# Patient Record
Sex: Female | Born: 1990 | Race: White | Hispanic: No | Marital: Married | Smoking: Never smoker
Health system: Southern US, Community
[De-identification: ages and names within clinical notes are randomized; demographics above are authoritative.]

## PROBLEM LIST (undated history)

## (undated) DIAGNOSIS — K589 Irritable bowel syndrome without diarrhea: Secondary | ICD-10-CM

## (undated) DIAGNOSIS — R0600 Dyspnea, unspecified: Secondary | ICD-10-CM

## (undated) DIAGNOSIS — J309 Allergic rhinitis, unspecified: Secondary | ICD-10-CM

## (undated) DIAGNOSIS — F419 Anxiety disorder, unspecified: Secondary | ICD-10-CM

## (undated) DIAGNOSIS — Z8619 Personal history of other infectious and parasitic diseases: Secondary | ICD-10-CM

## (undated) DIAGNOSIS — J189 Pneumonia, unspecified organism: Secondary | ICD-10-CM

## (undated) DIAGNOSIS — R519 Headache, unspecified: Secondary | ICD-10-CM

## (undated) DIAGNOSIS — Z87442 Personal history of urinary calculi: Secondary | ICD-10-CM

## (undated) DIAGNOSIS — N200 Calculus of kidney: Secondary | ICD-10-CM

## (undated) DIAGNOSIS — T7840XA Allergy, unspecified, initial encounter: Secondary | ICD-10-CM

## (undated) DIAGNOSIS — M5126 Other intervertebral disc displacement, lumbar region: Secondary | ICD-10-CM

## (undated) DIAGNOSIS — G47419 Narcolepsy without cataplexy: Secondary | ICD-10-CM

## (undated) DIAGNOSIS — J45909 Unspecified asthma, uncomplicated: Secondary | ICD-10-CM

## (undated) HISTORY — DX: Other intervertebral disc displacement, lumbar region: M51.26

## (undated) HISTORY — DX: Personal history of other infectious and parasitic diseases: Z86.19

## (undated) HISTORY — PX: NASAL SEPTUM SURGERY: SHX37

## (undated) HISTORY — PX: TONSILLECTOMY: SUR1361

## (undated) HISTORY — DX: Allergic rhinitis, unspecified: J30.9

## (undated) HISTORY — DX: Irritable bowel syndrome, unspecified: K58.9

## (undated) HISTORY — DX: Allergy, unspecified, initial encounter: T78.40XA

## (undated) HISTORY — DX: Anxiety disorder, unspecified: F41.9

## (undated) HISTORY — DX: Narcolepsy without cataplexy: G47.419

## (undated) HISTORY — DX: Calculus of kidney: N20.0

## (undated) HISTORY — PX: LUMBAR FUSION: SHX111

## (undated) HISTORY — PX: EYE SURGERY: SHX253

## (undated) HISTORY — DX: Unspecified asthma, uncomplicated: J45.909

---
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
## 2009-03-22 ENCOUNTER — Encounter: Payer: Self-pay | Admitting: Internal Medicine

## 2009-04-26 ENCOUNTER — Ambulatory Visit: Payer: Self-pay | Admitting: Internal Medicine

## 2009-04-26 LAB — CONVERTED CEMR LAB
BUN: 17 mg/dL (ref 6–23)
Bilirubin Urine: NEGATIVE
Bilirubin, Direct: 0.1 mg/dL (ref 0.0–0.3)
CO2: 28 meq/L (ref 19–32)
Chloride: 108 meq/L (ref 96–112)
Cholesterol: 158 mg/dL (ref 0–200)
Creatinine, Ser: 0.8 mg/dL (ref 0.4–1.2)
Eosinophils Absolute: 0.1 10*3/uL (ref 0.0–0.7)
Glucose, Bld: 89 mg/dL (ref 70–99)
Ketones, urine, test strip: NEGATIVE
LDL Cholesterol: 93 mg/dL (ref 0–99)
Lymphs Abs: 2.9 10*3/uL (ref 0.7–4.0)
MCHC: 34.9 g/dL (ref 30.0–36.0)
MCV: 92 fL (ref 78.0–100.0)
Monocytes Absolute: 0.4 10*3/uL (ref 0.1–1.0)
Neutrophils Relative %: 43.1 % (ref 43.0–77.0)
Platelets: 221 10*3/uL (ref 150.0–400.0)
RDW: 11.3 % — ABNORMAL LOW (ref 11.5–14.6)
TSH: 3 microintl units/mL (ref 0.35–5.50)
Total Bilirubin: 0.5 mg/dL (ref 0.3–1.2)
Triglycerides: 75 mg/dL (ref 0.0–149.0)
Urobilinogen, UA: 0.2
WBC: 5.9 10*3/uL (ref 4.5–10.5)
pH: 6

## 2009-05-03 ENCOUNTER — Ambulatory Visit: Payer: Self-pay | Admitting: Internal Medicine

## 2011-06-24 ENCOUNTER — Emergency Department (HOSPITAL_COMMUNITY)
Admission: EM | Admit: 2011-06-24 | Discharge: 2011-06-24 | Disposition: A | Discharge status: Home / Self Care | Payer: BC Managed Care – PPO | Attending: Emergency Medicine | Admitting: Emergency Medicine

## 2011-06-24 ENCOUNTER — Emergency Department (HOSPITAL_COMMUNITY): Payer: BC Managed Care – PPO

## 2011-06-24 ENCOUNTER — Encounter (HOSPITAL_COMMUNITY): Payer: Self-pay

## 2011-06-24 DIAGNOSIS — H539 Unspecified visual disturbance: Secondary | ICD-10-CM | POA: Insufficient documentation

## 2011-06-24 DIAGNOSIS — R51 Headache: Secondary | ICD-10-CM | POA: Insufficient documentation

## 2011-11-26 ENCOUNTER — Ambulatory Visit (INDEPENDENT_AMBULATORY_CARE_PROVIDER_SITE_OTHER): Payer: BC Managed Care – PPO

## 2011-11-26 DIAGNOSIS — N760 Acute vaginitis: Secondary | ICD-10-CM

## 2012-03-22 LAB — HM PAP SMEAR: HM Pap smear: NORMAL

## 2012-04-27 ENCOUNTER — Other Ambulatory Visit (INDEPENDENT_AMBULATORY_CARE_PROVIDER_SITE_OTHER): Payer: Self-pay

## 2012-04-27 DIAGNOSIS — Z0000 Encounter for general adult medical examination without abnormal findings: Secondary | ICD-10-CM

## 2012-04-27 LAB — POCT URINALYSIS DIPSTICK
Bilirubin, UA: NEGATIVE
Glucose, UA: NEGATIVE
Ketones, UA: NEGATIVE
Nitrite, UA: NEGATIVE
pH, UA: 5.5

## 2012-04-27 LAB — CBC WITH DIFFERENTIAL/PLATELET
Basophils Absolute: 0 10*3/uL (ref 0.0–0.1)
Eosinophils Relative: 2.4 % (ref 0.0–5.0)
Lymphs Abs: 1.9 10*3/uL (ref 0.7–4.0)
MCV: 90.9 fl (ref 78.0–100.0)
Monocytes Absolute: 0.3 10*3/uL (ref 0.1–1.0)
Neutrophils Relative %: 58.6 % (ref 43.0–77.0)
Platelets: 284 10*3/uL (ref 150.0–400.0)
RDW: 12 % (ref 11.5–14.6)
WBC: 5.8 10*3/uL (ref 4.5–10.5)

## 2012-04-27 LAB — TSH: TSH: 2.77 u[IU]/mL (ref 0.35–5.50)

## 2012-04-27 LAB — HEPATIC FUNCTION PANEL
ALT: 21 U/L (ref 0–35)
Bilirubin, Direct: 0 mg/dL (ref 0.0–0.3)
Total Bilirubin: 0.3 mg/dL (ref 0.3–1.2)

## 2012-04-27 LAB — LIPID PANEL
HDL: 56.2 mg/dL (ref >39.00)
Total CHOL/HDL Ratio: 3
Triglycerides: 117 mg/dL (ref 0.0–149.0)
VLDL: 23.4 mg/dL (ref 0.0–40.0)

## 2012-04-27 LAB — BASIC METABOLIC PANEL
CO2: 26 mEq/L (ref 19–32)
GFR: 97.18 mL/min (ref >60.00)
Glucose, Bld: 94 mg/dL (ref 70–99)
Potassium: 4.3 mEq/L (ref 3.5–5.1)
Sodium: 138 mEq/L (ref 135–145)

## 2012-05-04 ENCOUNTER — Encounter: Payer: Self-pay | Admitting: Internal Medicine

## 2012-05-04 ENCOUNTER — Ambulatory Visit (INDEPENDENT_AMBULATORY_CARE_PROVIDER_SITE_OTHER): Payer: BC Managed Care – PPO | Admitting: Internal Medicine

## 2012-05-04 VITALS — BP 100/66 | HR 84 | Temp 98.1°F | Resp 20 | Ht 65.0 in | Wt 177.0 lb

## 2012-05-04 DIAGNOSIS — Z0000 Encounter for general adult medical examination without abnormal findings: Secondary | ICD-10-CM

## 2012-05-04 NOTE — Progress Notes (Signed)
  Subjective:    Patient ID: Lori Mann, female    DOB: 21-Mar-1991, 21 y.o.   MRN: [id number]  HPI  21 year old patient who is seen today for a health maintenance exam. She does quite well without concerns or complaints. She's had a recent gynecologic check and has had a laboratory panel performed as well. She is scheduled to see a nutritionist through her work. She has gained over 30 pounds over the past 3 years    Review of Systems  Constitutional: Negative for fever, appetite change, fatigue and unexpected weight change.  HENT: Negative for hearing loss, ear pain, nosebleeds, congestion, sore throat, mouth sores, trouble swallowing, neck stiffness, dental problem, voice change, sinus pressure and tinnitus.   Eyes: Negative for photophobia, pain, redness and visual disturbance.  Respiratory: Negative for cough, chest tightness and shortness of breath.   Cardiovascular: Negative for chest pain, palpitations and leg swelling.  Gastrointestinal: Negative for nausea, vomiting, abdominal pain, diarrhea, constipation, blood in stool, abdominal distention and rectal pain.  Genitourinary: Negative for dysuria, urgency, frequency, hematuria, flank pain, vaginal bleeding, vaginal discharge, difficulty urinating, genital sores, vaginal pain, menstrual problem and pelvic pain.  Musculoskeletal: Negative for back pain and arthralgias.  Skin: Negative for rash.  Neurological: Negative for dizziness, syncope, speech difficulty, weakness, light-headedness, numbness and headaches.  Hematological: Negative for adenopathy. Does not bruise/bleed easily.  Psychiatric/Behavioral: Negative for suicidal ideas, behavioral problems, self-injury, dysphoric mood and agitation. The patient is not nervous/anxious.        Objective:   Physical Exam  Constitutional: She is oriented to person, place, and time. She appears well-developed and well-nourished.       Weight 177 Blood pressure low normal  HENT:  Head:  Normocephalic and atraumatic.  Right Ear: External ear normal.  Left Ear: External ear normal.  Mouth/Throat: Oropharynx is clear and moist.  Eyes: Conjunctivae and EOM are normal.  Neck: Normal range of motion. Neck supple. No JVD present. No thyromegaly present.  Cardiovascular: Normal rate, regular rhythm, normal heart sounds and intact distal pulses.   No murmur heard. Pulmonary/Chest: Effort normal and breath sounds normal. She has no wheezes. She has no rales.  Abdominal: Soft. Bowel sounds are normal. She exhibits no distension and no mass. There is no tenderness. There is no rebound and no guarding.  Musculoskeletal: Normal range of motion. She exhibits no edema and no tenderness.  Neurological: She is alert and oriented to person, place, and time. She has normal reflexes. No cranial nerve deficit. She exhibits normal muscle tone. Coordination normal.  Skin: Skin is warm and dry. No rash noted.  Psychiatric: She has a normal mood and affect. Her behavior is normal.          Assessment & Plan:   Preventive health examination Exogenous obesity. Weight loss exercise encouraged Laboratory studies discussed and reviewed and copy dispensed

## 2012-05-04 NOTE — Patient Instructions (Signed)
It is important that you exercise regularly, at least 20 minutes 3 to 4 times per week.  If you develop chest pain or shortness of breath seek  medical attention.  You need to lose weight.  Consider a lower calorie diet and regular exercise.  Call or return to clinic prn if these symptoms worsen or fail to improve as anticipated.  

## 2013-01-18 ENCOUNTER — Ambulatory Visit (INDEPENDENT_AMBULATORY_CARE_PROVIDER_SITE_OTHER): Payer: BC Managed Care – PPO | Admitting: Family Medicine

## 2013-01-18 VITALS — BP 138/79 | HR 83 | Temp 98.0°F | Resp 18 | Ht 65.0 in | Wt 184.0 lb

## 2013-01-18 DIAGNOSIS — H5710 Ocular pain, unspecified eye: Secondary | ICD-10-CM

## 2013-01-18 DIAGNOSIS — R51 Headache: Secondary | ICD-10-CM

## 2013-01-18 DIAGNOSIS — J3489 Other specified disorders of nose and nasal sinuses: Secondary | ICD-10-CM

## 2013-01-18 DIAGNOSIS — H5712 Ocular pain, left eye: Secondary | ICD-10-CM

## 2013-01-18 NOTE — Patient Instructions (Signed)
Follow up with optho tomorrow.  No contact lenses until after that office visit. Ok to use artificial tears for now. Return to the clinic or go to the nearest emergency room if any of your symptoms worsen or new symptoms occur.

## 2013-01-18 NOTE — Progress Notes (Signed)
  Subjective:    Patient ID: Lori Mann, female    DOB: [birth year], 22 y.o.   MRN: [id number]  HPI Lori Mann is a 22 y.o. female  Started with sneezing, runny eyes and nose yesterday - L eye watering.  No fever. Prior to yesterday feeling. [facility] - told to be seen here for eye symptoms.  No hx of allergy symptoms.  No known sick contacts.  Wears contact lenses - same ones for past 2 weeks.  Did wear contacts in water. Eye is slightly sore today. Vision is ok.   Tx: allegra last week (for sneezing for past week), but runny nose and eye just since yesterday. netipot today.   L sided headache - mild   Review of Systems  Constitutional: Negative for fever and chills.  HENT: Positive for rhinorrhea.   Eyes: Positive for photophobia (slight headache with lights in office).  Respiratory: Negative for cough.   Neurological: Positive for headaches. Negative for dizziness, syncope, weakness and light-headedness.       Objective:   Physical Exam  Vitals reviewed. Constitutional: She is oriented to person, place, and time. She appears well-developed and well-nourished. No distress.  HENT:  Head: Normocephalic and atraumatic.  Right Ear: Hearing, tympanic membrane, external ear and ear canal normal.  Left Ear: Hearing, tympanic membrane, external ear and ear canal normal.  Nose: Nose normal.  Mouth/Throat: Oropharynx is clear and moist. No oropharyngeal exudate.  Eyes: EOM are normal. Pupils are equal, round, and reactive to light. Left eye exhibits discharge (clear. ). Left eye exhibits no exudate. No foreign body present in the left eye. Left conjunctiva is injected (minimal). Left eye exhibits normal extraocular motion.       Patient removed contact lenses. Anterior chamber clear,  Proparacaine 2 gtts applied.  Lids everted, no foreign body identified. Fluorescein applied, no uptake.  Flushed with sterile water, no complications. Closed eye palpation -  no apparent discrepancy in firmness, and no pain with palpation.    Cardiovascular: Normal rate, regular rhythm, normal heart sounds and intact distal pulses.   No murmur heard. Pulmonary/Chest: Effort normal and breath sounds normal. No respiratory distress. She has no wheezes. She has no rhonchi.  Neurological: She is alert and oriented to person, place, and time.  Skin: Skin is warm and dry. No rash noted.  Psychiatric: She has a normal mood and affect. Her behavior is normal.      Assessment & Plan:  Lori Mann is a 22 y.o. female 1. Pain, eye, left  Ambulatory referral to Ophthalmology  2. Headache  Ambulatory referral to Ophthalmology  3. Rhinorrhea     Rhinorrhea, watery l eye with preceeding sneezing - possible allergic rhinitis, with secondary irritation from contact lenses.  Will have eval at optho tomorrow for possible slit lamp exam.  Patient Instructions  Follow up with optho tomorrow.  No contact lenses until after that office visit. Ok to use artificial tears for now. Return to the clinic or go to the nearest emergency room if any of your symptoms worsen or new symptoms occur.

## 2013-06-02 ENCOUNTER — Encounter: Payer: Self-pay | Admitting: Internal Medicine

## 2013-06-02 ENCOUNTER — Ambulatory Visit (INDEPENDENT_AMBULATORY_CARE_PROVIDER_SITE_OTHER): Payer: 59 | Admitting: Internal Medicine

## 2013-06-02 VITALS — BP 122/80 | HR 111 | Temp 98.8°F | Resp 20 | Wt 177.0 lb

## 2013-06-02 DIAGNOSIS — J069 Acute upper respiratory infection, unspecified: Secondary | ICD-10-CM

## 2013-06-02 MED ORDER — HYDROCODONE-HOMATROPINE 5-1.5 MG/5ML PO SYRP
5.0000 mL | ORAL_SOLUTION | Freq: Four times a day (QID) | ORAL | Status: AC | PRN
Start: 2013-06-02 — End: 2013-06-12

## 2013-06-02 MED ORDER — FLUTICASONE PROPIONATE 50 MCG/ACT NA SUSP: 2.0000 | Freq: Every day | NASAL | Status: DC

## 2013-06-02 NOTE — Progress Notes (Signed)
Subjective:    Patient ID: Lori Mann, female    DOB: 11-Nov-1991, 22 y.o.   MRN: [id number]  HPI  22 year old female who presents with a three-day history of fever body aches mild headache sore throat and nonproductive cough. Fever intensifies during the night. No history of rash tick exposure or strep exposure  Past Medical History  Diagnosis Date  . H/O infectious mononucleosis     History   Social History  . Marital Status: Single    Spouse Name: N/A    Number of Children: N/A  . Years of Education: N/A   Occupational History  . Not on file.   Social History Main Topics  . Smoking status: Never Smoker   . Smokeless tobacco: Never Used  . Alcohol Use: Yes  . Drug Use: No  . Sexually Active: Yes    Birth Control/ Protection: Pill   Other Topics Concern  . Not on file   Social History Narrative  . No narrative on file    History reviewed. No pertinent past surgical history.  Family History  Problem Relation Age of Onset  . Cancer Mother     breast ca  . Arthritis Father   . Hyperlipidemia Father     No Known Allergies  No current outpatient prescriptions on file prior to visit.   No current facility-administered medications on file prior to visit.    BP 122/80  Pulse 111  Temp(Src) 98.8 F (37.1 C) (Oral)  Resp 20  Wt 177 lb (80.287 kg)  BMI 29.45 kg/m2  SpO2 96%  LMP 05/14/2013       Review of Systems  Constitutional: Positive for fever, activity change, appetite change and fatigue. Negative for chills.  HENT: Positive for sore throat. Negative for hearing loss, congestion, rhinorrhea, dental problem, sinus pressure and tinnitus.   Eyes: Negative for pain, discharge and visual disturbance.  Respiratory: Positive for cough. Negative for shortness of breath.   Cardiovascular: Negative for chest pain, palpitations and leg swelling.  Gastrointestinal: Negative for nausea, vomiting, abdominal pain, diarrhea, constipation, blood in stool and  abdominal distention.  Genitourinary: Negative for dysuria, urgency, frequency, hematuria, flank pain, vaginal bleeding, vaginal discharge, difficulty urinating, vaginal pain and pelvic pain.  Musculoskeletal: Negative for joint swelling, arthralgias and gait problem.  Skin: Negative for rash.  Neurological: Negative for dizziness, syncope, speech difficulty, weakness, numbness and headaches.  Hematological: Negative for adenopathy.  Psychiatric/Behavioral: Negative for behavioral problems, dysphoric mood and agitation. The patient is not nervous/anxious.        Objective:   Physical Exam  Constitutional: She is oriented to person, place, and time. She appears well-developed and well-nourished.  HENT:  Head: Normocephalic.  Right Ear: External ear normal.  Left Ear: External ear normal.  Mouth/Throat: Oropharynx is clear and moist.  Tonsil enlargement and no erythema  Eyes: Conjunctivae and EOM are normal. Pupils are equal, round, and reactive to light.  Neck: Normal range of motion. Neck supple. No thyromegaly present.  Cardiovascular: Normal rate, regular rhythm, normal heart sounds and intact distal pulses.   Pulmonary/Chest: Effort normal and breath sounds normal.  Abdominal: Soft. Bowel sounds are normal. She exhibits no mass. There is no tenderness.  Musculoskeletal: Normal range of motion.  Lymphadenopathy:    She has no cervical adenopathy.  Neurological: She is alert and oriented to person, place, and time.  Skin: Skin is warm and dry. No rash noted.  Psychiatric: She has a normal mood and affect. Her behavior is  normal.          Assessment & Plan:   Viral syndrome with cough. We'll treat fever with acetaminophen. We'll treat symptomatically with Hydromet

## 2013-06-02 NOTE — Patient Instructions (Signed)
Acute bronchitis symptoms for less than 10 days are generally not helped by antibiotics.  Take over-the-counter expectorants and cough medications such as  Mucinex DM.  Call if there is no improvement in 5 to 7 days or if he developed worsening cough, fever, or new symptoms, such as shortness of breath or chest pain.    

## 2014-01-04 ENCOUNTER — Ambulatory Visit: Payer: BC Managed Care – PPO | Admitting: Podiatry

## 2014-01-18 ENCOUNTER — Ambulatory Visit: Payer: Self-pay | Admitting: Podiatry

## 2014-01-23 ENCOUNTER — Ambulatory Visit (INDEPENDENT_AMBULATORY_CARE_PROVIDER_SITE_OTHER): Payer: 59 | Admitting: Podiatry

## 2014-01-23 ENCOUNTER — Ambulatory Visit (INDEPENDENT_AMBULATORY_CARE_PROVIDER_SITE_OTHER): Payer: 59

## 2014-01-23 ENCOUNTER — Ambulatory Visit: Payer: Self-pay | Admitting: Podiatry

## 2014-01-23 ENCOUNTER — Encounter: Payer: Self-pay | Admitting: Podiatry

## 2014-01-23 VITALS — BP 132/86 | HR 86 | Resp 12

## 2014-01-23 DIAGNOSIS — R52 Pain, unspecified: Secondary | ICD-10-CM

## 2014-01-23 DIAGNOSIS — M722 Plantar fascial fibromatosis: Secondary | ICD-10-CM

## 2014-01-23 NOTE — Progress Notes (Signed)
Subjective:     Patient ID: Lori Mann, female   DOB: Oct 24, 1991, 23 y.o.   MRN: [id number]  Foot Pain   patient presents stating I am getting cramps in my arches when I do any type of dancing or jumping. States that she has started new activities and has been having trouble ever since   Review of Systems  All other systems reviewed and are negative.       Objective:   Physical Exam  Nursing note and vitals reviewed. Constitutional: She is oriented to person, place, and time.  Cardiovascular: Intact distal pulses.   Musculoskeletal: Normal range of motion.  Neurological: She is oriented to person, place, and time.  Skin: Skin is warm.   neurovascular status intact with no muscle strength loss and mild equinus condition noted both feet. Mild inflammation of the central plantar left over right foot with cramping been a #1 symptoms     Assessment:     Low grade plantar fasciitis with issues associated with certain activity    Plan:     H&P and x-ray reviewed with patient. Gave instructions on stretching exercises for the plantar fascia and recommended rigid bottom shoes with consideration for orthotics or bracing if symptoms intensified

## 2014-01-23 NOTE — Progress Notes (Signed)
N-CRAMPING L-B/L ARCHES D-1 YEAR O-SLOWLY C-SAME A-JUMPING T-ICE

## 2014-01-23 NOTE — Patient Instructions (Signed)

## 2014-01-23 NOTE — Progress Notes (Signed)
   Subjective:    Patient ID: Lori Mann, female    DOB: 1991/11/23, 23 y.o.   MRN: [id number]  HPI'' BOTH ARCHES ARE CRAMPING.''    Review of Systems  Allergic/Immunologic: Positive for environmental allergies.  Hematological: Bruises/bleeds easily.  All other systems reviewed and are negative.       Objective:   Physical Exam        Assessment & Plan:

## 2014-07-18 ENCOUNTER — Ambulatory Visit (INDEPENDENT_AMBULATORY_CARE_PROVIDER_SITE_OTHER): Payer: 59 | Admitting: Physician Assistant

## 2014-07-18 VITALS — BP 110/80 | HR 86 | Temp 98.3°F | Resp 18 | Ht 65.5 in | Wt 180.8 lb

## 2014-07-18 DIAGNOSIS — R05 Cough: Secondary | ICD-10-CM

## 2014-07-18 DIAGNOSIS — J988 Other specified respiratory disorders: Secondary | ICD-10-CM

## 2014-07-18 DIAGNOSIS — J22 Unspecified acute lower respiratory infection: Secondary | ICD-10-CM

## 2014-07-18 DIAGNOSIS — J3089 Other allergic rhinitis: Secondary | ICD-10-CM

## 2014-07-18 DIAGNOSIS — R0602 Shortness of breath: Secondary | ICD-10-CM

## 2014-07-18 DIAGNOSIS — R059 Cough, unspecified: Secondary | ICD-10-CM

## 2014-07-18 MED ORDER — AZITHROMYCIN 250 MG PO TABS: ORAL_TABLET | ORAL | Status: DC

## 2014-07-18 MED ORDER — HYDROCOD POLST-CHLORPHEN POLST 10-8 MG/5ML PO LQCR: 5.0000 mL | Freq: Two times a day (BID) | ORAL | Status: DC | PRN

## 2014-07-18 MED ORDER — ALBUTEROL SULFATE (2.5 MG/3ML) 0.083% IN NEBU: 2.5000 mg | INHALATION_SOLUTION | Freq: Once | RESPIRATORY_TRACT | Status: DC

## 2014-07-18 MED ORDER — PREDNISONE 20 MG PO TABS: ORAL_TABLET | ORAL | Status: DC

## 2014-07-18 MED ORDER — ALBUTEROL SULFATE (2.5 MG/3ML) 0.083% IN NEBU
2.5000 mg | INHALATION_SOLUTION | Freq: Once | RESPIRATORY_TRACT | Status: AC
  Administered 2014-07-18: 2.5 mg via RESPIRATORY_TRACT

## 2014-07-18 NOTE — Progress Notes (Signed)
   Subjective:    Patient ID: Lori Mann, female    DOB: Jun 09, 1991, 23 y.o.   MRN: [id number]  HPI 23 year old female presents for evaluation of 3 week history of dry cough. Symptoms started suddenly without any prodrome of URI sx's.  She admits to some SOB and chest tightness, especially when exercising.  No hx of asthma and she is a nonsmoker.  Has been taking Robitussin and using throat lozenges but symptoms persist. At onset of sx's she did have several episodes of post-tussive emesis.  No fevers, chills, nausea, vomiting, headache, dizziness, chest pain, sore throat, otalgia, sinus pain, PND, or GERD.  Patient is otherwise healthy with no other concerns today.     Review of Systems  Constitutional: Negative for fever and chills.  HENT: Negative for congestion, ear pain, postnasal drip and sinus pressure.   Respiratory: Positive for cough and shortness of breath. Negative for chest tightness and wheezing.   Cardiovascular: Negative for chest pain.  Gastrointestinal: Negative for nausea and vomiting.  Neurological: Negative for dizziness and headaches.       Objective:   Physical Exam  Constitutional: She is oriented to person, place, and time. She appears well-developed and well-nourished.  HENT:  Head: Normocephalic and atraumatic.  Right Ear: Hearing, tympanic membrane, external ear and ear canal normal.  Left Ear: Hearing, tympanic membrane, external ear and ear canal normal.  Mouth/Throat: Uvula is midline, oropharynx is clear and moist and mucous membranes are normal.  Eyes: Conjunctivae are normal.  Neck: Normal range of motion. Neck supple.  Cardiovascular: Normal rate, regular rhythm and normal heart sounds.   Pulmonary/Chest: Effort normal and breath sounds normal.  Lymphadenopathy:    She has no cervical adenopathy.  Neurological: She is alert and oriented to person, place, and time.  Psychiatric: She has a normal mood and affect. Her behavior is normal.  Judgment and thought content normal.          Assessment & Plan:  Shortness of breath - Plan: predniSONE (DELTASONE) 20 MG tablet, albuterol (PROVENTIL) (2.5 MG/3ML) 0.083% nebulizer solution 2.5 mg, DISCONTINUED: albuterol (PROVENTIL) (2.5 MG/3ML) 0.083% nebulizer solution  Cough - Plan: predniSONE (DELTASONE) 20 MG tablet, chlorpheniramine-HYDROcodone (TUSSIONEX PENNKINETIC ER) 10-8 MG/5ML LQCR, albuterol (PROVENTIL) (2.5 MG/3ML) 0.083% nebulizer solution 2.5 mg, DISCONTINUED: albuterol (PROVENTIL) (2.5 MG/3ML) 0.083% nebulizer solution  Other allergic rhinitis - Plan: albuterol (PROVENTIL) (2.5 MG/3ML) 0.083% nebulizer solution 2.5 mg  Lower respiratory infection (e.g., bronchitis, pneumonia, pneumonitis, pulmonitis) - Plan: azithromycin (ZITHROMAX) 250 MG tablet, albuterol (PROVENTIL) (2.5 MG/3ML) 0.083% nebulizer solution 2.5 mg  Lungs are clear and patient is afebrile. Symptoms did not improve significantly after albuterol nebulizer.  Will treat with Zpack to cover atypicals and pertussive due to post-tussive emesis Prednisone taper as directed tussionex q12 hours prn cough - caution sedation RTC precautions discussed. Recheck if symptoms worsening or fail to improve.

## 2014-07-20 ENCOUNTER — Other Ambulatory Visit: Payer: Self-pay

## 2014-07-20 MED ORDER — ALBUTEROL SULFATE HFA 108 (90 BASE) MCG/ACT IN AERS: 2.0000 | INHALATION_SPRAY | RESPIRATORY_TRACT | Status: DC | PRN

## 2014-07-20 NOTE — Telephone Encounter (Signed)
Fax from pharm asking for substitution for albuterol neb solution sent for pt bc she does not have a nebulizer. Did you want to send in Rx for inhaler?

## 2015-03-02 ENCOUNTER — Telehealth: Payer: Self-pay | Admitting: Internal Medicine

## 2015-03-02 MED ORDER — OSELTAMIVIR PHOSPHATE 75 MG PO CAPS: 75.0000 mg | ORAL_CAPSULE | Freq: Every day | ORAL | Status: DC

## 2015-03-02 NOTE — Telephone Encounter (Signed)
Pt dad has been  Hospitalized  At [facility] with dx of H1N1 and daughter has been in close contact with her dad and would llike tamiflu call into [facility] [employer]. Pt has no symptoms

## 2015-03-02 NOTE — Telephone Encounter (Signed)
Discussed with Dr.K, verbal order for Tamilflu 75 mg one tablet daily x 10 days. Rx sent to pharmacy.  Pt notified Rx sent to pharmacy.

## 2015-03-02 NOTE — Telephone Encounter (Signed)
Please see message and advise 

## 2015-03-29 ENCOUNTER — Ambulatory Visit: Payer: Self-pay | Admitting: Family Medicine

## 2015-06-11 ENCOUNTER — Encounter: Payer: Self-pay | Admitting: Internal Medicine

## 2015-06-26 ENCOUNTER — Ambulatory Visit (INDEPENDENT_AMBULATORY_CARE_PROVIDER_SITE_OTHER): Payer: Managed Care, Other (non HMO) | Admitting: Internal Medicine

## 2015-06-26 ENCOUNTER — Encounter: Payer: Self-pay | Admitting: Internal Medicine

## 2015-06-26 VITALS — BP 122/80 | HR 68 | Temp 99.0°F | Resp 20 | Ht 65.0 in | Wt 211.0 lb

## 2015-06-26 DIAGNOSIS — G4733 Obstructive sleep apnea (adult) (pediatric): Secondary | ICD-10-CM | POA: Diagnosis not present

## 2015-06-26 DIAGNOSIS — Z0000 Encounter for general adult medical examination without abnormal findings: Secondary | ICD-10-CM

## 2015-06-26 LAB — LIPID PANEL
CHOL/HDL RATIO: 3
CHOLESTEROL: 157 mg/dL (ref 0–200)
HDL: 46.6 mg/dL (ref >39.00)
LDL CALC: 74 mg/dL (ref 0–99)
NonHDL: 110.4
TRIGLYCERIDES: 183 mg/dL — AB (ref 0.0–149.0)
VLDL: 36.6 mg/dL (ref 0.0–40.0)

## 2015-06-26 LAB — CBC WITH DIFFERENTIAL/PLATELET
Basophils Absolute: 0 10*3/uL (ref 0.0–0.1)
Basophils Relative: 0.2 % (ref 0.0–3.0)
Eosinophils Absolute: 0.4 10*3/uL (ref 0.0–0.7)
Eosinophils Relative: 3.2 % (ref 0.0–5.0)
HCT: 38.7 % (ref 36.0–46.0)
HEMOGLOBIN: 13.1 g/dL (ref 12.0–15.0)
LYMPHS PCT: 24.2 % (ref 12.0–46.0)
Lymphs Abs: 3.2 10*3/uL (ref 0.7–4.0)
MCHC: 33.9 g/dL (ref 30.0–36.0)
MCV: 89.7 fl (ref 78.0–100.0)
MONOS PCT: 3.8 % (ref 3.0–12.0)
Monocytes Absolute: 0.5 10*3/uL (ref 0.1–1.0)
Neutro Abs: 9 10*3/uL — ABNORMAL HIGH (ref 1.4–7.7)
Neutrophils Relative %: 68.6 % (ref 43.0–77.0)
PLATELETS: 336 10*3/uL (ref 150.0–400.0)
RBC: 4.32 Mil/uL (ref 3.87–5.11)
RDW: 13.2 % (ref 11.5–15.5)
WBC: 13.2 10*3/uL — AB (ref 4.0–10.5)

## 2015-06-26 LAB — COMPREHENSIVE METABOLIC PANEL
ALK PHOS: 41 U/L (ref 39–117)
ALT: 23 U/L (ref 0–35)
AST: 20 U/L (ref 0–37)
Albumin: 3.6 g/dL (ref 3.5–5.2)
BUN: 10 mg/dL (ref 6–23)
CO2: 24 mEq/L (ref 19–32)
CREATININE: 0.73 mg/dL (ref 0.40–1.20)
Calcium: 8.9 mg/dL (ref 8.4–10.5)
Chloride: 103 mEq/L (ref 96–112)
GFR: 102.21 mL/min (ref >60.00)
Glucose, Bld: 87 mg/dL (ref 70–99)
Potassium: 4.3 mEq/L (ref 3.5–5.1)
Sodium: 136 mEq/L (ref 135–145)
Total Bilirubin: 0.3 mg/dL (ref 0.2–1.2)
Total Protein: 7.2 g/dL (ref 6.0–8.3)

## 2015-06-26 LAB — TSH: TSH: 2.87 u[IU]/mL (ref 0.35–4.50)

## 2015-06-26 NOTE — Progress Notes (Signed)
Pre visit review using our clinic review tool, if applicable. No additional management support is needed unless otherwise documented below in the visit note. 

## 2015-06-26 NOTE — Progress Notes (Signed)
Subjective:    Patient ID: Lori Mann, female    DOB: 05/23/1991, 24 y.o.   MRN: [id number]  HPI  Subjective:    Patient ID: Lori Mann, female    DOB: February 06, 1991, 24 y.o.   MRN: [id number]  HPI  75 -year-old patient who is seen today for a health maintenance exam.  She is followed by gynecology and presently is on Lexapro due to mood swings and anxiety.  She feels this has been a great benefit. There has been some significant weight gain over the years.  Wt Readings from Last 3 Encounters:  06/26/15 211 lb (95.709 kg)  07/18/14 180 lb 12.8 oz (82.01 kg)  06/02/13 177 lb (80.287 kg)   In December, she underwent tonsillectomy with uvuloplasty and correction of a septal deviation.  She continues to have issues with loud snoring and significant daytime sleepiness.   Past Medical History  Diagnosis Date  . H/O infectious mononucleosis        Review of Systems  Constitutional: Negative for fever, appetite change, fatigue and unexpected weight change.  HENT: Negative for hearing loss, ear pain, nosebleeds, congestion, sore throat, mouth sores, trouble swallowing, neck stiffness, dental problem, voice change, sinus pressure and tinnitus.   Eyes: Negative for photophobia, pain, redness and visual disturbance.  Respiratory: Negative for cough, chest tightness and shortness of breath.   Cardiovascular: Negative for chest pain, palpitations and leg swelling.  Gastrointestinal: Negative for nausea, vomiting, abdominal pain, diarrhea, constipation, blood in stool, abdominal distention and rectal pain.  Genitourinary: Negative for dysuria, urgency, frequency, hematuria, flank pain, vaginal bleeding, vaginal discharge, difficulty urinating, genital sores, vaginal pain, menstrual problem and pelvic pain.  Musculoskeletal: Negative for back pain and arthralgias.  Skin: Negative for rash.  Neurological: Negative for dizziness, syncope, speech difficulty, weakness, light-headedness,  numbness and headaches.  Hematological: Negative for adenopathy. Does not bruise/bleed easily.  Psychiatric/Behavioral: Negative for suicidal ideas, behavioral problems, self-injury, dysphoric mood and agitation. The patient is not nervous/anxious.        Objective:   Physical Exam  Constitutional: She is oriented to person, place, and time. She appears well-developed and well-nourished.       Weight 177 Blood pressure low normal  HENT:  Head: Normocephalic and atraumatic.  Right Ear: External ear normal.  Left Ear: External ear normal.  Mouth/Throat: Oropharynx is clear and moist.  Eyes: Conjunctivae and EOM are normal.  Neck: Normal range of motion. Neck supple. No JVD present. No thyromegaly present.  Cardiovascular: Normal rate, regular rhythm, normal heart sounds and intact distal pulses.   No murmur heard. Pulmonary/Chest: Effort normal and breath sounds normal. She has no wheezes. She has no rales.  Abdominal: Soft. Bowel sounds are normal. She exhibits no distension and no mass. There is no tenderness. There is no rebound and no guarding.  Musculoskeletal: Normal range of motion. She exhibits no edema and no tenderness.  Neurological: She is alert and oriented to person, place, and time. She has normal reflexes. No cranial nerve deficit. She exhibits normal muscle tone. Coordination normal.  Skin: Skin is warm and dry. No rash noted.  Psychiatric: She has a normal mood and affect. Her behavior is normal.          Assessment & Plan:   Preventive health examination Exogenous obesity. Weight loss exercise encouraged     Review of Systems     Objective:   Physical Exam  Constitutional:  Weight 211  HENT:  Status post uvuloplasty  with low hanging soft palate          Assessment & Plan:   Preventive health examination Exogenous obesity Rule out OSA.  At home sleep study will be ordered  Laboratory update will be reviewed No change in medical regimen

## 2015-06-26 NOTE — Patient Instructions (Addendum)
It is important that you exercise regularly, at least 20 minutes 3 to 4 times per week.  If you develop chest pain or shortness of breath seek  medical attention.  You need to lose weight.  Consider a lower calorie diet and regular exercise.  Health Maintenance Adopting a healthy lifestyle and getting preventive care can go a long way to promote health and wellness. Talk with your health care provider about what schedule of regular examinations is right for you. This is a good chance for you to check in with your provider about disease prevention and staying healthy. In between checkups, there are plenty of things you can do on your own. Experts have done a lot of research about which lifestyle changes and preventive measures are most likely to keep you healthy. Ask your health care provider for more information. WEIGHT AND DIET  Eat a healthy diet  Be sure to include plenty of vegetables, fruits, low-fat dairy products, and lean protein.  Do not eat a lot of foods high in solid fats, added sugars, or salt.  Get regular exercise. This is one of the most important things you can do for your health.  Most adults should exercise for at least 150 minutes each week. The exercise should increase your heart rate and make you sweat (moderate-intensity exercise).  Most adults should also do strengthening exercises at least twice a week. This is in addition to the moderate-intensity exercise.  Maintain a healthy weight  Body mass index (BMI) is a measurement that can be used to identify possible weight problems. It estimates body fat based on height and weight. Your health care provider can help determine your BMI and help you achieve or maintain a healthy weight.  For females 69 years of age and older:   A BMI below 18.5 is considered underweight.  A BMI of 18.5 to 24.9 is normal.  A BMI of 25 to 29.9 is considered overweight.  A BMI of 30 and above is considered obese.  Watch levels of  cholesterol and blood lipids  You should start having your blood tested for lipids and cholesterol at 26 years of age, then have this test every 5 years.  You may need to have your cholesterol levels checked more often if:  Your lipid or cholesterol levels are high.  You are older than 26 years of age.  You are at high risk for heart disease.  CANCER SCREENING   Lung Cancer  Lung cancer screening is recommended for adults 81-42 years old who are at high risk for lung cancer because of a history of smoking.  A yearly low-dose CT scan of the lungs is recommended for people who:  Currently smoke.  Have quit within the past 15 years.  Have at least a 30-pack-year history of smoking. A pack year is smoking an average of one pack of cigarettes a day for 1 year.  Yearly screening should continue until it has been 15 years since you quit.  Yearly screening should stop if you develop a health problem that would prevent you from having lung cancer treatment.  Breast Cancer  Practice breast self-awareness. This means understanding how your breasts normally appear and feel.  It also means doing regular breast self-exams. Let your health care provider know about any changes, no matter how small.  If you are in your 20s or 30s, you should have a clinical breast exam (CBE) by a health care provider every 1-3 years as part of a  regular health exam.  If you are 40 or older, have a CBE every year. Also consider having a breast X-ray (mammogram) every year.  If you have a family history of breast cancer, talk to your health care provider about genetic screening.  If you are at high risk for breast cancer, talk to your health care provider about having an MRI and a mammogram every year.  Breast cancer gene (BRCA) assessment is recommended for women who have family members with BRCA-related cancers. BRCA-related cancers include:  Breast.  Ovarian.  Tubal.  Peritoneal  cancers.  Results of the assessment will determine the need for genetic counseling and BRCA1 and BRCA2 testing. Cervical Cancer Routine pelvic examinations to screen for cervical cancer are no longer recommended for nonpregnant women who are considered low risk for cancer of the pelvic organs (ovaries, uterus, and vagina) and who do not have symptoms. A pelvic examination may be necessary if you have symptoms including those associated with pelvic infections. Ask your health care provider if a screening pelvic exam is right for you.   The Pap test is the screening test for cervical cancer for women who are considered at risk.  If you had a hysterectomy for a problem that was not cancer or a condition that could lead to cancer, then you no longer need Pap tests.  If you are older than 65 years, and you have had normal Pap tests for the past 10 years, you no longer need to have Pap tests.  If you have had past treatment for cervical cancer or a condition that could lead to cancer, you need Pap tests and screening for cancer for at least 20 years after your treatment.  If you no longer get a Pap test, assess your risk factors if they change (such as having a new sexual partner). This can affect whether you should start being screened again.  Some women have medical problems that increase their chance of getting cervical cancer. If this is the case for you, your health care provider may recommend more frequent screening and Pap tests.  The human papillomavirus (HPV) test is another test that may be used for cervical cancer screening. The HPV test looks for the virus that can cause cell changes in the cervix. The cells collected during the Pap test can be tested for HPV.  The HPV test can be used to screen women 49 years of age and older. Getting tested for HPV can extend the interval between normal Pap tests from three to five years.  An HPV test also should be used to screen women of any age who  have unclear Pap test results.  After 26 years of age, women should have HPV testing as often as Pap tests.  Colorectal Cancer  This type of cancer can be detected and often prevented.  Routine colorectal cancer screening usually begins at 26 years of age and continues through 26 years of age.  Your health care provider may recommend screening at an earlier age if you have risk factors for colon cancer.  Your health care provider may also recommend using home test kits to check for hidden blood in the stool.  A small camera at the end of a tube can be used to examine your colon directly (sigmoidoscopy or colonoscopy). This is done to check for the earliest forms of colorectal cancer.  Routine screening usually begins at age 66.  Direct examination of the colon should be repeated every 5-10 years through 26  years of age. However, you may need to be screened more often if early forms of precancerous polyps or small growths are found. Skin Cancer  Check your skin from head to toe regularly.  Tell your health care provider about any new moles or changes in moles, especially if there is a change in a mole's shape or color.  Also tell your health care provider if you have a mole that is larger than the size of a pencil eraser.  Always use sunscreen. Apply sunscreen liberally and repeatedly throughout the day.  Protect yourself by wearing long sleeves, pants, a wide-brimmed hat, and sunglasses whenever you are outside. HEART DISEASE, DIABETES, AND HIGH BLOOD PRESSURE   Have your blood pressure checked at least every 1-2 years. High blood pressure causes heart disease and increases the risk of stroke.  If you are between 70 years and 39 years old, ask your health care provider if you should take aspirin to prevent strokes.  Have regular diabetes screenings. This involves taking a blood sample to check your fasting blood sugar level.  If you are at a normal weight and have a low risk for  diabetes, have this test once every three years after 26 years of age.  If you are overweight and have a high risk for diabetes, consider being tested at a younger age or more often. PREVENTING INFECTION  Hepatitis B  If you have a higher risk for hepatitis B, you should be screened for this virus. You are considered at high risk for hepatitis B if:  You were born in a country where hepatitis B is common. Ask your health care provider which countries are considered high risk.  Your parents were born in a high-risk country, and you have not been immunized against hepatitis B (hepatitis B vaccine).  You have HIV or AIDS.  You use needles to inject street drugs.  You live with someone who has hepatitis B.  You have had sex with someone who has hepatitis B.  You get hemodialysis treatment.  You take certain medicines for conditions, including cancer, organ transplantation, and autoimmune conditions. Hepatitis C  Blood testing is recommended for:  Everyone born from [birth year] through 1967.  Anyone with known risk factors for hepatitis C. Sexually transmitted infections (STIs)  You should be screened for sexually transmitted infections (STIs) including gonorrhea and chlamydia if:  You are sexually active and are younger than 26 years of age.  You are older than 26 years of age and your health care provider tells you that you are at risk for this type of infection.  Your sexual activity has changed since you were last screened and you are at an increased risk for chlamydia or gonorrhea. Ask your health care provider if you are at risk.  If you do not have HIV, but are at risk, it may be recommended that you take a prescription medicine daily to prevent HIV infection. This is called pre-exposure prophylaxis (PrEP). You are considered at risk if:  You are sexually active and do not regularly use condoms or know the HIV status of your partner(s).  You take drugs by injection.  You are  sexually active with a partner who has HIV. Talk with your health care provider about whether you are at high risk of being infected with HIV. If you choose to begin PrEP, you should first be tested for HIV. You should then be tested every 3 months for as long as you are taking PrEP.  PREGNANCY   If you are premenopausal and you may become pregnant, ask your health care provider about preconception counseling.  If you may become pregnant, take 400 to 800 micrograms (mcg) of folic acid every day.  If you want to prevent pregnancy, talk to your health care provider about birth control (contraception). OSTEOPOROSIS AND MENOPAUSE   Osteoporosis is a disease in which the bones lose minerals and strength with aging. This can result in serious bone fractures. Your risk for osteoporosis can be identified using a bone density scan.  If you are 3 years of age or older, or if you are at risk for osteoporosis and fractures, ask your health care provider if you should be screened.  Ask your health care provider whether you should take a calcium or vitamin D supplement to lower your risk for osteoporosis.  Menopause may have certain physical symptoms and risks.  Hormone replacement therapy may reduce some of these symptoms and risks. Talk to your health care provider about whether hormone replacement therapy is right for you.  HOME CARE INSTRUCTIONS   Schedule regular health, dental, and eye exams.  Stay current with your immunizations.   Do not use any tobacco products including cigarettes, chewing tobacco, or electronic cigarettes.  If you are pregnant, do not drink alcohol.  If you are breastfeeding, limit how much and how often you drink alcohol.  Limit alcohol intake to no more than 1 drink per day for nonpregnant women. One drink equals 12 ounces of beer, 5 ounces of wine, or 1 ounces of hard liquor.  Do not use street drugs.  Do not share needles.  Ask your health care provider for  help if you need support or information about quitting drugs.  Tell your health care provider if you often feel depressed.  Tell your health care provider if you have ever been abused or do not feel safe at home. Document Released: 06/23/2011 Document Revised: 04/24/2014 Document Reviewed: 11/09/2013 [facility] Patient Information 2015 [place], [place]. This information is not intended to replace advice given to you by your health care provider. Make sure you discuss any questions you have with your health care provider.   Home sleep study as discussed

## 2015-08-13 ENCOUNTER — Encounter: Payer: Self-pay | Admitting: Internal Medicine

## 2015-09-18 ENCOUNTER — Encounter: Payer: Self-pay | Admitting: Pulmonary Disease

## 2015-09-18 ENCOUNTER — Ambulatory Visit (INDEPENDENT_AMBULATORY_CARE_PROVIDER_SITE_OTHER): Payer: Managed Care, Other (non HMO) | Admitting: Pulmonary Disease

## 2015-09-18 VITALS — BP 110/78 | HR 80 | Temp 98.3°F | Ht 65.0 in | Wt 215.0 lb

## 2015-09-18 DIAGNOSIS — G4733 Obstructive sleep apnea (adult) (pediatric): Secondary | ICD-10-CM

## 2015-09-18 NOTE — Progress Notes (Signed)
Chief Complaint  Patient presents with  . SLEEP CONSULT    Pt referred by DR. Kwiatkowski:pt states she feels like she sleeps well at night, but pt is waking up really restless, falling asleep at work, and driving. pt c/o daytime sleeping. pt states she could sleep all day and still wake up exhausted.  Epworth Score : 16.    History of Present Illness: Lori Mann is a 24 y.o. female for evaluation of sleep problems.  She has noticed problems feeling sleepy during the day.  This has been going on for sometime, and seems to be getting worse.  She can fall asleep whenever she sits down.  She has fallen asleep at work, and has started to fall asleep while driving.  Her boyfriend has told her that she snores, and will stop breathing while asleep.  She was seen by her Ob/Gyn and had lab work last year which she was told was normal.  She was started on lexapro to see if her sleep would improve by improving her mood.  She has not noticed any difference.  She had repair of nasal septal deviation with tonsillectomy by Dr. Ezzard Standing.  This didn't help her daytime sleepiness either.  She was seen by her PCP and had repeat lab work >> again normal.  She was therefore referred for further sleep evaluation.  She goes to sleep at 10 pm.  She falls asleep in 2 minutes.  She wakes up some times to use the bathroom.  She gets out of bed at 8 am.  It is a struggle to get out of bed.  She feels tired in the morning, and this persists throughout the day.  She denies morning headache.  She does not use anything to help her fall sleep or stay awake.  She has tried drinking coffee to stay awake, but caffeine doesn't do anything for her.  She denies sleep walking, sleep talking, bruxism, or nightmares.  There is no history of restless legs.  She denies sleep hallucinations, sleep paralysis, or cataplexy.  She has allergies, and takes claritin at nighttime.  The Epworth score is 16 out of 24.   Lori Mann  has a  past medical history of H/O infectious mononucleosis and Allergy.  Lori Mann  has past surgical history that includes Tonsillectomy and Nasal septum surgery.  Prior to Admission medications   Medication Sig Start Date End Date Taking? Authorizing Kaliyah Gladman  acetaminophen (TYLENOL) 325 MG tablet Take 650 mg by mouth every 6 (six) hours as needed for pain.   Yes Historical Liel Rudden, MD  escitalopram (LEXAPRO) 10 MG tablet Take 10 mg by mouth daily. 06/04/15  Yes Historical Jamon Hayhurst, MD  loratadine (CLARITIN) 10 MG tablet Take 10 mg by mouth daily.   Yes Historical Brandy Kabat, MD  ORTHO-CYCLEN, 28, 0.25-35 MG-MCG tablet Take 1 tablet by mouth daily. 05/05/15  Yes Historical Tatum Massman, MD    No Known Allergies  Her family history includes Arthritis in her father; Cancer in her mother; Hyperlipidemia in her father.  She  reports that she has never smoked. She has never used smokeless tobacco. She reports that she drinks alcohol. She reports that she does not use illicit drugs.  Review of Systems  Constitutional: Negative for fever and unexpected weight change.  HENT: Positive for postnasal drip, sinus pressure and sneezing. Negative for congestion, dental problem, ear pain, nosebleeds, rhinorrhea, sore throat and trouble swallowing.   Eyes: Negative for redness and itching.  Respiratory: Negative for  cough, chest tightness, shortness of breath and wheezing.   Cardiovascular: Negative for palpitations and leg swelling.  Gastrointestinal: Negative for nausea and vomiting.  Genitourinary: Negative for dysuria.  Musculoskeletal: Negative for joint swelling.  Skin: Negative for rash.  Hematological: Bruises/bleeds easily.  Psychiatric/Behavioral: Positive for dysphoric mood. The patient is not nervous/anxious.    Physical Exam: BP 110/78 mmHg  Pulse 80  Temp(Src) 98.3 F (36.8 C) (Oral)  Ht  (1.651 m)  Wt 215 lb (97.523 kg)  BMI 35.78 kg/m2  SpO2 96%  General - No distress ENT -  No sinus tenderness, no oral exudate, no LAN, no thyromegaly, TM clear, pupils equal/reactive, MP 4, enlarged tongue Cardiac - s1s2 regular, no murmur, pulses symmetric Chest - No wheeze/rales/dullness, good air entry, normal respiratory excursion Back - No focal tenderness Abd - Soft, non-tender, no organomegaly, + bowel sounds Ext - No edema Neuro - Normal strength, cranial nerves intact Skin - No rashes Psych - Normal mood, and behavior  Discussion: She has snoring, sleep disruption, witnessed apnea, and daytime sleepiness.  Her BMI is > 35.  She has family hx of sleep apnea, and there has been concern about mood disorder related to her sleep difficulties.  I am concerned she could have sleep apnea.  We discussed how sleep apnea can affect various health problems including risks for hypertension, cardiovascular disease, and diabetes.  We also discussed how sleep disruption can increase risks for accident, such as while driving.  Weight loss as a means of improving sleep apnea was also reviewed.  Additional treatment options discussed were CPAP therapy, oral appliance, and surgical intervention.  Assessment/plan:  Obstructive sleep apnea. Plan: - will arrange for home sleep study pending insurance approval  Obesity. Plan: - discussed importance of weight loss   Coralyn Helling, M.D. Pager [phone]

## 2015-09-18 NOTE — Progress Notes (Deleted)
   Subjective:    Patient ID: Lori Mann, female    DOB: 03-Feb-1991, 24 y.o.   MRN: [id number]  HPI    Review of Systems  Constitutional: Negative for fever and unexpected weight change.  HENT: Positive for postnasal drip, sinus pressure and sneezing. Negative for congestion, dental problem, ear pain, nosebleeds, rhinorrhea, sore throat and trouble swallowing.   Eyes: Negative for redness and itching.  Respiratory: Negative for cough, chest tightness, shortness of breath and wheezing.   Cardiovascular: Negative for palpitations and leg swelling.  Gastrointestinal: Negative for nausea and vomiting.  Genitourinary: Negative for dysuria.  Musculoskeletal: Negative for joint swelling.  Skin: Negative for rash.  Hematological: Bruises/bleeds easily.  Psychiatric/Behavioral: Positive for dysphoric mood. The patient is not nervous/anxious.        Objective:   Physical Exam        Assessment & Plan:

## 2015-09-18 NOTE — Patient Instructions (Signed)
Will arrange for home sleep study Will call to arrange for follow up after sleep study reviewed  

## 2015-09-24 DIAGNOSIS — G4733 Obstructive sleep apnea (adult) (pediatric): Secondary | ICD-10-CM | POA: Diagnosis not present

## 2015-09-25 ENCOUNTER — Telehealth: Payer: Self-pay | Admitting: Pulmonary Disease

## 2015-09-25 DIAGNOSIS — G4733 Obstructive sleep apnea (adult) (pediatric): Secondary | ICD-10-CM | POA: Insufficient documentation

## 2015-09-25 NOTE — Telephone Encounter (Signed)
HST 09/24/15 >> AHI 12.4, SaO2 low 90%.  Will have my nurse inform pt that sleep study shows mild sleep apnea.  Options are 1) CPAP now, 2) ROV first (can be with me or Tammy Parrett).  If She is agreeable to CPAP, then please send order for auto CPAP range 5 to 15 cm H2O with heated humidity and mask of choice.  Have download sent 1 month after starting CPAP and set up ROV 2 months after starting CPAP.

## 2015-09-28 NOTE — Telephone Encounter (Signed)
Patient opted to try CPAP. Order entered for CPAP machine. Patient scheduled to see TP in 6 weeks. Nothing further needed.

## 2015-10-02 ENCOUNTER — Other Ambulatory Visit: Payer: Self-pay | Admitting: *Deleted

## 2015-10-02 DIAGNOSIS — G4733 Obstructive sleep apnea (adult) (pediatric): Secondary | ICD-10-CM | POA: Diagnosis not present

## 2015-10-09 ENCOUNTER — Ambulatory Visit (INDEPENDENT_AMBULATORY_CARE_PROVIDER_SITE_OTHER): Payer: Managed Care, Other (non HMO) | Admitting: Family Medicine

## 2015-10-09 ENCOUNTER — Encounter: Payer: Self-pay | Admitting: Internal Medicine

## 2015-10-09 ENCOUNTER — Telehealth: Payer: Self-pay | Admitting: Internal Medicine

## 2015-10-09 ENCOUNTER — Ambulatory Visit (INDEPENDENT_AMBULATORY_CARE_PROVIDER_SITE_OTHER): Payer: Managed Care, Other (non HMO) | Admitting: Internal Medicine

## 2015-10-09 ENCOUNTER — Ambulatory Visit (INDEPENDENT_AMBULATORY_CARE_PROVIDER_SITE_OTHER)
Admission: RE | Admit: 2015-10-09 | Discharge: 2015-10-09 | Disposition: A | Discharge status: Home / Self Care | Payer: Managed Care, Other (non HMO) | Source: Ambulatory Visit | Attending: Internal Medicine | Admitting: Internal Medicine

## 2015-10-09 VITALS — BP 136/90 | HR 82 | Temp 98.4°F | Resp 20 | Ht 66.0 in | Wt 210.0 lb

## 2015-10-09 VITALS — BP 110/80 | HR 83 | Temp 98.1°F | Resp 18 | Ht 66.0 in | Wt 209.0 lb

## 2015-10-09 DIAGNOSIS — R1031 Right lower quadrant pain: Secondary | ICD-10-CM

## 2015-10-09 DIAGNOSIS — N23 Unspecified renal colic: Secondary | ICD-10-CM | POA: Diagnosis not present

## 2015-10-09 DIAGNOSIS — R34 Anuria and oliguria: Secondary | ICD-10-CM

## 2015-10-09 DIAGNOSIS — G8929 Other chronic pain: Secondary | ICD-10-CM | POA: Diagnosis not present

## 2015-10-09 DIAGNOSIS — N132 Hydronephrosis with renal and ureteral calculous obstruction: Secondary | ICD-10-CM

## 2015-10-09 LAB — POCT CBC
GRANULOCYTE PERCENT: 58.1 % (ref 37–80)
HEMATOCRIT: 36.5 % — AB (ref 37.7–47.9)
Hemoglobin: 12.6 g/dL (ref 12.2–16.2)
Lymph, poc: 2.5 (ref 0.6–3.4)
MCH, POC: 30.1 pg (ref 27–31.2)
MCHC: 34.7 g/dL (ref 31.8–35.4)
MCV: 86.9 fL (ref 80–97)
MID (cbc): 0.5 (ref 0–0.9)
MPV: 6.4 fL (ref 0–99.8)
POC Granulocyte: 4.1 (ref 2–6.9)
POC LYMPH %: 35 % (ref 10–50)
POC MID %: 6.9 %M (ref 0–12)
Platelet Count, POC: 333 10*3/uL (ref 142–424)
RBC: 4.19 M/uL (ref 4.04–5.48)
RDW, POC: 12.4 %
WBC: 7.1 10*3/uL (ref 4.6–10.2)

## 2015-10-09 LAB — COMPLETE METABOLIC PANEL WITH GFR
ALT: 26 U/L (ref 6–29)
AST: 16 U/L (ref 10–30)
Albumin: 3.8 g/dL (ref 3.6–5.1)
Alkaline Phosphatase: 48 U/L (ref 33–115)
BILIRUBIN TOTAL: 0.4 mg/dL (ref 0.2–1.2)
BUN: 12 mg/dL (ref 7–25)
CHLORIDE: 99 mmol/L (ref 98–110)
CO2: 25 mmol/L (ref 20–31)
Calcium: 9.1 mg/dL (ref 8.6–10.2)
Creat: 0.89 mg/dL (ref 0.50–1.10)
GLUCOSE: 106 mg/dL — AB (ref 65–99)
Potassium: 4.4 mmol/L (ref 3.5–5.3)
SODIUM: 131 mmol/L — AB (ref 135–146)
TOTAL PROTEIN: 7 g/dL (ref 6.1–8.1)

## 2015-10-09 LAB — POCT URINALYSIS DIP (MANUAL ENTRY)
BILIRUBIN UA: NEGATIVE
Blood, UA: NEGATIVE
Glucose, UA: NEGATIVE
Leukocytes, UA: NEGATIVE
Nitrite, UA: NEGATIVE
PH UA: 5
Urobilinogen, UA: 0.2

## 2015-10-09 LAB — POC MICROSCOPIC URINALYSIS (UMFC)

## 2015-10-09 LAB — POCT URINE PREGNANCY
PREG TEST UR: NEGATIVE
Preg Test, Ur: NEGATIVE

## 2015-10-09 MED ORDER — HYDROCODONE-ACETAMINOPHEN 10-325 MG PO TABS: 1.0000 | ORAL_TABLET | Freq: Three times a day (TID) | ORAL | Status: DC | PRN

## 2015-10-09 NOTE — Progress Notes (Signed)
Pre visit review using our clinic review tool, if applicable. No additional management support is needed unless otherwise documented below in the visit note. 

## 2015-10-09 NOTE — Progress Notes (Signed)
Patient was discussed with Trena PlattStephanie English, PA-C. I went in and examined the patient also and discussed things with her. The patient has right lower quadrant pain directly in McBurney's point area. Bowel sounds are normal. She is not particular tender on percussion. On palpation she is tender directly in the right lower quadrant. Moderate, not severe tenderness. As being afebrile and having a normal white blood count I think the odds are very likely that this represents a mesenteric adenitis. That would go along with having the diarrhea. The plan is to recheck a CBC and a curette doesn't rule out. If she gets acutely worse with fever, increasing pain, etc. she should to the emergency room if we are closed. Plan discussed and  Verdell FaceDavid H Tarahji Ramthun M.D.

## 2015-10-09 NOTE — Telephone Encounter (Signed)
Patient Name: Lori Mann  DOB: [birth year]    Initial Comment Caller states woke up with bad pain in abd and back   Nurse Assessment      Guidelines    Guideline Title Affirmed Question Affirmed Notes       Final Disposition User        Comments  Caller states that she decided to go to urgent care. She will call back if needed.

## 2015-10-09 NOTE — Progress Notes (Deleted)
Patient ID: Greer Eendrea M Blanton, female    DOB: [birth year]  Age: 24 y.o. MRN: [id number]  Chief Complaint  Patient presents with  . Abdominal Pain    Bad pain on right side. Feels like she has to urinate but can't. Started this morning. Still has her appendix.     Subjective:   ***  Current allergies, medications, problem list, past/family and social histories reviewed.  Objective:  BP 110/80 mmHg  Pulse 83  Temp(Src) 98.1 F (36.7 C) (Oral)  Resp 18  Ht 5\' 6"  (1.676 m)  Wt 209 lb (94.802 kg)  BMI 33.75 kg/m2  SpO2 98%  LMP 10/06/2015  ***  Assessment & Plan:   Assessment: 1. RLQ abdominal pain   2. Abdominal pain, chronic, right lower quadrant   3. Anuria       Plan: ***  Orders Placed This Encounter  Procedures  . Urine culture  . COMPLETE METABOLIC PANEL WITH GFR  . POCT urine pregnancy  . POCT urinalysis dipstick  . POCT Microscopic Urinalysis ([facility])  . POCT CBC    No orders of the defined types were placed in this encounter.         Patient Instructions  Please take tylenol for the pain.  If you exhibit increased pain, uncontrollable nausea and vomiting, fever, dizziness, than you need to return here or to the ED.   We then can recheck this blood count for infection, anemia, and redo the urine.   Please push fluids on yourself.  Drink as much water as possible.  64oz of water per day is the normal, and at this time you should be doing more.         No Follow-up on file.   Anner Baity, MD 10/09/2015

## 2015-10-09 NOTE — Patient Instructions (Signed)
Drink as much fluid as you  can tolerate over the next few days  Renal Colic Renal colic is pain that is caused by passing a kidney stone. The pain can be sharp and severe. It may be felt in the back, abdomen, side (flank), or groin. It can cause nausea. Renal colic can come and go. HOME CARE INSTRUCTIONS Watch your condition for any changes. The following actions may help to lessen any discomfort that you are feeling:  Take medicines only as directed by your health care provider.  Ask your health care provider if it is okay to take over-the-counter pain medicine.  Drink enough fluid to keep your urine clear or pale yellow. Drink 6-8 glasses of water each day.  Limit the amount of salt that you eat to less than 2 grams per day.  Reduce the amount of protein in your diet. Eat less meat, fish, nuts, and dairy.  Avoid foods such as spinach, rhubarb, nuts, or bran. These may make kidney stones more likely to form. SEEK MEDICAL CARE IF:  You have a fever or chills.  Your urine smells bad or looks cloudy.  You have pain or burning when you pass urine. SEEK IMMEDIATE MEDICAL CARE IF:  Your flank pain or groin pain suddenly worsens.  You become confused or disoriented or you lose consciousness.   This information is not intended to replace advice given to you by your health care provider. Make sure you discuss any questions you have with your health care provider.   Document Released: 09/17/2005 Document Revised: 12/29/2014 Document Reviewed: 10/18/2014 Elsevier Interactive Patient Education 2016 [employer] Renal Colic Renal colic is pain that is caused by passing a kidney stone. The pain can be sharp and severe. It may be felt in the back, abdomen, side (flank), or groin. It can cause nausea. Renal colic can come and go. HOME CARE INSTRUCTIONS Watch your condition for any changes. The following actions may help to lessen any discomfort that you are feeling:  Take medicines only  as directed by your health care provider.  Ask your health care provider if it is okay to take over-the-counter pain medicine.  Drink enough fluid to keep your urine clear or pale yellow. Drink 6-8 glasses of water each day.  Limit the amount of salt that you eat to less than 2 grams per day.  Reduce the amount of protein in your diet. Eat less meat, fish, nuts, and dairy.  Avoid foods such as spinach, rhubarb, nuts, or bran. These may make kidney stones more likely to form. SEEK MEDICAL CARE IF:  You have a fever or chills.  Your urine smells bad or looks cloudy.  You have pain or burning when you pass urine. SEEK IMMEDIATE MEDICAL CARE IF:  Your flank pain or groin pain suddenly worsens.  You become confused or disoriented or you lose consciousness.   This information is not intended to replace advice given to you by your health care provider. Make sure you discuss any questions you have with your health care provider.   Document Released: 09/17/2005 Document Revised: 12/29/2014 Document Reviewed: 10/18/2014 Elsevier Interactive Patient Education 2016 [employer] Renal Colic Renal colic is pain that is caused by passing a kidney stone. The pain can be sharp and severe. It may be felt in the back, abdomen, side (flank), or groin. It can cause nausea. Renal colic can come and go. HOME CARE INSTRUCTIONS Watch your condition for any changes. The following actions may help to  lessen any discomfort that you are feeling:  Take medicines only as directed by your health care provider.  Ask your health care provider if it is okay to take over-the-counter pain medicine.  Drink enough fluid to keep your urine clear or pale yellow. Drink 6-8 glasses of water each day.  Limit the amount of salt that you eat to less than 2 grams per day.  Reduce the amount of protein in your diet. Eat less meat, fish, nuts, and dairy.  Avoid foods such as spinach, rhubarb, nuts, or bran. These may  make kidney stones more likely to form. SEEK MEDICAL CARE IF:  You have a fever or chills.  Your urine smells bad or looks cloudy.  You have pain or burning when you pass urine. SEEK IMMEDIATE MEDICAL CARE IF:  Your flank pain or groin pain suddenly worsens.  You become confused or disoriented or you lose consciousness.   This information is not intended to replace advice given to you by your health care provider. Make sure you discuss any questions you have with your health care provider.   Document Released: 09/17/2005 Document Revised: 12/29/2014 Document Reviewed: 10/18/2014 Elsevier Interactive Patient Education 2016 [employer] Renal Colic Renal colic is pain that is caused by passing a kidney stone. The pain can be sharp and severe. It may be felt in the back, abdomen, side (flank), or groin. It can cause nausea. Renal colic can come and go. HOME CARE INSTRUCTIONS Watch your condition for any changes. The following actions may help to lessen any discomfort that you are feeling:  Take medicines only as directed by your health care provider.  Ask your health care provider if it is okay to take over-the-counter pain medicine.  Drink enough fluid to keep your urine clear or pale yellow. Drink 6-8 glasses of water each day.  Limit the amount of salt that you eat to less than 2 grams per day.  Reduce the amount of protein in your diet. Eat less meat, fish, nuts, and dairy.  Avoid foods such as spinach, rhubarb, nuts, or bran. These may make kidney stones more likely to form. SEEK MEDICAL CARE IF:  You have a fever or chills.  Your urine smells bad or looks cloudy.  You have pain or burning when you pass urine. SEEK IMMEDIATE MEDICAL CARE IF:  Your flank pain or groin pain suddenly worsens.  You become confused or disoriented or you lose consciousness.   This information is not intended to replace advice given to you by your health care provider. Make sure you  discuss any questions you have with your health care provider.   Document Released: 09/17/2005 Document Revised: 12/29/2014 Document Reviewed: 10/18/2014 Elsevier Interactive Patient Education 2016 [employer] Renal Colic Renal colic is pain that is caused by passing a kidney stone. The pain can be sharp and severe. It may be felt in the back, abdomen, side (flank), or groin. It can cause nausea. Renal colic can come and go. HOME CARE INSTRUCTIONS Watch your condition for any changes. The following actions may help to lessen any discomfort that you are feeling:  Take medicines only as directed by your health care provider.  Ask your health care provider if it is okay to take over-the-counter pain medicine.  Drink enough fluid to keep your urine clear or pale yellow. Drink 6-8 glasses of water each day.  Limit the amount of salt that you eat to less than 2 grams per day.  Reduce the amount  of protein in your diet. Eat less meat, fish, nuts, and dairy.  Avoid foods such as spinach, rhubarb, nuts, or bran. These may make kidney stones more likely to form. SEEK MEDICAL CARE IF:  You have a fever or chills.  Your urine smells bad or looks cloudy.  You have pain or burning when you pass urine. SEEK IMMEDIATE MEDICAL CARE IF:  Your flank pain or groin pain suddenly worsens.  You become confused or disoriented or you lose consciousness.   This information is not intended to replace advice given to you by your health care provider. Make sure you discuss any questions you have with your health care provider.   Document Released: 09/17/2005 Document Revised: 12/29/2014 Document Reviewed: 10/18/2014 Elsevier Interactive Patient Education 2016 [employer] Renal Colic Renal colic is pain that is caused by passing a kidney stone. The pain can be sharp and severe. It may be felt in the back, abdomen, side (flank), or groin. It can cause nausea. Renal colic can come and go. HOME CARE  INSTRUCTIONS Watch your condition for any changes. The following actions may help to lessen any discomfort that you are feeling:  Take medicines only as directed by your health care provider.  Ask your health care provider if it is okay to take over-the-counter pain medicine.  Drink enough fluid to keep your urine clear or pale yellow. Drink 6-8 glasses of water each day.  Limit the amount of salt that you eat to less than 2 grams per day.  Reduce the amount of protein in your diet. Eat less meat, fish, nuts, and dairy.  Avoid foods such as spinach, rhubarb, nuts, or bran. These may make kidney stones more likely to form. SEEK MEDICAL CARE IF:  You have a fever or chills.  Your urine smells bad or looks cloudy.  You have pain or burning when you pass urine. SEEK IMMEDIATE MEDICAL CARE IF:  Your flank pain or groin pain suddenly worsens.  You become confused or disoriented or you lose consciousness.   This information is not intended to replace advice given to you by your health care provider. Make sure you discuss any questions you have with your health care provider.   Document Released: 09/17/2005 Document Revised: 12/29/2014 Document Reviewed: 10/18/2014 Elsevier Interactive Patient Education 2016 [employer] Renal Colic Renal colic is pain that is caused by passing a kidney stone. The pain can be sharp and severe. It may be felt in the back, abdomen, side (flank), or groin. It can cause nausea. Renal colic can come and go. HOME CARE INSTRUCTIONS Watch your condition for any changes. The following actions may help to lessen any discomfort that you are feeling:  Take medicines only as directed by your health care provider.  Ask your health care provider if it is okay to take over-the-counter pain medicine.  Drink enough fluid to keep your urine clear or pale yellow. Drink 6-8 glasses of water each day.  Limit the amount of salt that you eat to less than 2 grams per  day.  Reduce the amount of protein in your diet. Eat less meat, fish, nuts, and dairy.  Avoid foods such as spinach, rhubarb, nuts, or bran. These may make kidney stones more likely to form. SEEK MEDICAL CARE IF:  You have a fever or chills.  Your urine smells bad or looks cloudy.  You have pain or burning when you pass urine. SEEK IMMEDIATE MEDICAL CARE IF:  Your flank pain or groin pain  suddenly worsens.  You become confused or disoriented or you lose consciousness.   This information is not intended to replace advice given to you by your health care provider. Make sure you discuss any questions you have with your health care provider.   Document Released: 09/17/2005 Document Revised: 12/29/2014 Document Reviewed: 10/18/2014 Elsevier Interactive Patient Education 2016 [employer] Renal Colic Renal colic is pain that is caused by passing a kidney stone. The pain can be sharp and severe. It may be felt in the back, abdomen, side (flank), or groin. It can cause nausea. Renal colic can come and go. HOME CARE INSTRUCTIONS Watch your condition for any changes. The following actions may help to lessen any discomfort that you are feeling:  Take medicines only as directed by your health care provider.  Ask your health care provider if it is okay to take over-the-counter pain medicine.  Drink enough fluid to keep your urine clear or pale yellow. Drink 6-8 glasses of water each day.  Limit the amount of salt that you eat to less than 2 grams per day.  Reduce the amount of protein in your diet. Eat less meat, fish, nuts, and dairy.  Avoid foods such as spinach, rhubarb, nuts, or bran. These may make kidney stones more likely to form. SEEK MEDICAL CARE IF:  You have a fever or chills.  Your urine smells bad or looks cloudy.  You have pain or burning when you pass urine. SEEK IMMEDIATE MEDICAL CARE IF:  Your flank pain or groin pain suddenly worsens.  You become confused or  disoriented or you lose consciousness.   This information is not intended to replace advice given to you by your health care provider. Make sure you discuss any questions you have with your health care provider.   Document Released: 09/17/2005 Document Revised: 12/29/2014 Document Reviewed: 10/18/2014 Elsevier Interactive Patient Education 2016 [employer] Renal Colic Renal colic is pain that is caused by passing a kidney stone. The pain can be sharp and severe. It may be felt in the back, abdomen, side (flank), or groin. It can cause nausea. Renal colic can come and go. HOME CARE INSTRUCTIONS Watch your condition for any changes. The following actions may help to lessen any discomfort that you are feeling:  Take medicines only as directed by your health care provider.  Ask your health care provider if it is okay to take over-the-counter pain medicine.  Drink enough fluid to keep your urine clear or pale yellow. Drink 6-8 glasses of water each day.  Limit the amount of salt that you eat to less than 2 grams per day.  Reduce the amount of protein in your diet. Eat less meat, fish, nuts, and dairy.  Avoid foods such as spinach, rhubarb, nuts, or bran. These may make kidney stones more likely to form. SEEK MEDICAL CARE IF:  You have a fever or chills.  Your urine smells bad or looks cloudy.  You have pain or burning when you pass urine. SEEK IMMEDIATE MEDICAL CARE IF:  Your flank pain or groin pain suddenly worsens.  You become confused or disoriented or you lose consciousness.   This information is not intended to replace advice given to you by your health care provider. Make sure you discuss any questions you have with your health care provider.   Document Released: 09/17/2005 Document Revised: 12/29/2014 Document Reviewed: 10/18/2014 Elsevier Interactive Patient Education 2016 [employer] Renal Colic Renal colic is pain that is caused by passing a  kidney stone. The pain  can be sharp and severe. It may be felt in the back, abdomen, side (flank), or groin. It can cause nausea. Renal colic can come and go. HOME CARE INSTRUCTIONS Watch your condition for any changes. The following actions may help to lessen any discomfort that you are feeling:  Take medicines only as directed by your health care provider.  Ask your health care provider if it is okay to take over-the-counter pain medicine.  Drink enough fluid to keep your urine clear or pale yellow. Drink 6-8 glasses of water each day.  Limit the amount of salt that you eat to less than 2 grams per day.  Reduce the amount of protein in your diet. Eat less meat, fish, nuts, and dairy.  Avoid foods such as spinach, rhubarb, nuts, or bran. These may make kidney stones more likely to form. SEEK MEDICAL CARE IF:  You have a fever or chills.  Your urine smells bad or looks cloudy.  You have pain or burning when you pass urine. SEEK IMMEDIATE MEDICAL CARE IF:  Your flank pain or groin pain suddenly worsens.  You become confused or disoriented or you lose consciousness.   This information is not intended to replace advice given to you by your health care provider. Make sure you discuss any questions you have with your health care provider.   Document Released: 09/17/2005 Document Revised: 12/29/2014 Document Reviewed: 10/18/2014 Elsevier Interactive Patient Education 2016 [employer] Renal Colic Renal colic is pain that is caused by passing a kidney stone. The pain can be sharp and severe. It may be felt in the back, abdomen, side (flank), or groin. It can cause nausea. Renal colic can come and go. HOME CARE INSTRUCTIONS Watch your condition for any changes. The following actions may help to lessen any discomfort that you are feeling:  Take medicines only as directed by your health care provider.  Ask your health care provider if it is okay to take over-the-counter pain medicine.  Drink enough fluid  to keep your urine clear or pale yellow. Drink 6-8 glasses of water each day.  Limit the amount of salt that you eat to less than 2 grams per day.  Reduce the amount of protein in your diet. Eat less meat, fish, nuts, and dairy.  Avoid foods such as spinach, rhubarb, nuts, or bran. These may make kidney stones more likely to form. SEEK MEDICAL CARE IF:  You have a fever or chills.  Your urine smells bad or looks cloudy.  You have pain or burning when you pass urine. SEEK IMMEDIATE MEDICAL CARE IF:  Your flank pain or groin pain suddenly worsens.  You become confused or disoriented or you lose consciousness.   This information is not intended to replace advice given to you by your health care provider. Make sure you discuss any questions you have with your health care provider.   Document Released: 09/17/2005 Document Revised: 12/29/2014 Document Reviewed: 10/18/2014 Elsevier Interactive Patient Education 2016 [employer] Renal Colic Renal colic is pain that is caused by passing a kidney stone. The pain can be sharp and severe. It may be felt in the back, abdomen, side (flank), or groin. It can cause nausea. Renal colic can come and go. HOME CARE INSTRUCTIONS Watch your condition for any changes. The following actions may help to lessen any discomfort that you are feeling:  Take medicines only as directed by your health care provider.  Ask your health care provider if it  is okay to take over-the-counter pain medicine.  Drink enough fluid to keep your urine clear or pale yellow. Drink 6-8 glasses of water each day.  Limit the amount of salt that you eat to less than 2 grams per day.  Reduce the amount of protein in your diet. Eat less meat, fish, nuts, and dairy.  Avoid foods such as spinach, rhubarb, nuts, or bran. These may make kidney stones more likely to form. SEEK MEDICAL CARE IF:  You have a fever or chills.  Your urine smells bad or looks cloudy.  You have pain  or burning when you pass urine. SEEK IMMEDIATE MEDICAL CARE IF:  Your flank pain or groin pain suddenly worsens.  You become confused or disoriented or you lose consciousness.   This information is not intended to replace advice given to you by your health care provider. Make sure you discuss any questions you have with your health care provider.   Document Released: 09/17/2005 Document Revised: 12/29/2014 Document Reviewed: 10/18/2014 Elsevier Interactive Patient Education 2016 [employer] Renal Colic Renal colic is pain that is caused by passing a kidney stone. The pain can be sharp and severe. It may be felt in the back, abdomen, side (flank), or groin. It can cause nausea. Renal colic can come and go. HOME CARE INSTRUCTIONS Watch your condition for any changes. The following actions may help to lessen any discomfort that you are feeling:  Take medicines only as directed by your health care provider.  Ask your health care provider if it is okay to take over-the-counter pain medicine.  Drink enough fluid to keep your urine clear or pale yellow. Drink 6-8 glasses of water each day.  Limit the amount of salt that you eat to less than 2 grams per day.  Reduce the amount of protein in your diet. Eat less meat, fish, nuts, and dairy.  Avoid foods such as spinach, rhubarb, nuts, or bran. These may make kidney stones more likely to form. SEEK MEDICAL CARE IF:  You have a fever or chills.  Your urine smells bad or looks cloudy.  You have pain or burning when you pass urine. SEEK IMMEDIATE MEDICAL CARE IF:  Your flank pain or groin pain suddenly worsens.  You become confused or disoriented or you lose consciousness.   This information is not intended to replace advice given to you by your health care provider. Make sure you discuss any questions you have with your health care provider.   Document Released: 09/17/2005 Document Revised: 12/29/2014 Document Reviewed:  10/18/2014 Elsevier Interactive Patient Education 2016 [employer] Renal Colic Renal colic is pain that is caused by passing a kidney stone. The pain can be sharp and severe. It may be felt in the back, abdomen, side (flank), or groin. It can cause nausea. Renal colic can come and go. HOME CARE INSTRUCTIONS Watch your condition for any changes. The following actions may help to lessen any discomfort that you are feeling:  Take medicines only as directed by your health care provider.  Ask your health care provider if it is okay to take over-the-counter pain medicine.  Drink enough fluid to keep your urine clear or pale yellow. Drink 6-8 glasses of water each day.  Limit the amount of salt that you eat to less than 2 grams per day.  Reduce the amount of protein in your diet. Eat less meat, fish, nuts, and dairy.  Avoid foods such as spinach, rhubarb, nuts, or bran. These may make kidney  stones more likely to form. SEEK MEDICAL CARE IF:  You have a fever or chills.  Your urine smells bad or looks cloudy.  You have pain or burning when you pass urine. SEEK IMMEDIATE MEDICAL CARE IF:  Your flank pain or groin pain suddenly worsens.  You become confused or disoriented or you lose consciousness.   This information is not intended to replace advice given to you by your health care provider. Make sure you discuss any questions you have with your health care provider.   Document Released: 09/17/2005 Document Revised: 12/29/2014 Document Reviewed: 10/18/2014 Elsevier Interactive Patient Education 2016 [employer] Renal Colic Renal colic is pain that is caused by passing a kidney stone. The pain can be sharp and severe. It may be felt in the back, abdomen, side (flank), or groin. It can cause nausea. Renal colic can come and go. HOME CARE INSTRUCTIONS Watch your condition for any changes. The following actions may help to lessen any discomfort that you are feeling:  Take medicines  only as directed by your health care provider.  Ask your health care provider if it is okay to take over-the-counter pain medicine.  Drink enough fluid to keep your urine clear or pale yellow. Drink 6-8 glasses of water each day.  Limit the amount of salt that you eat to less than 2 grams per day.  Reduce the amount of protein in your diet. Eat less meat, fish, nuts, and dairy.  Avoid foods such as spinach, rhubarb, nuts, or bran. These may make kidney stones more likely to form. SEEK MEDICAL CARE IF:  You have a fever or chills.  Your urine smells bad or looks cloudy.  You have pain or burning when you pass urine. SEEK IMMEDIATE MEDICAL CARE IF:  Your flank pain or groin pain suddenly worsens.  You become confused or disoriented or you lose consciousness.   This information is not intended to replace advice given to you by your health care provider. Make sure you discuss any questions you have with your health care provider.   Document Released: 09/17/2005 Document Revised: 12/29/2014 Document Reviewed: 10/18/2014 Elsevier Interactive Patient Education 2016 [employer] Renal Colic Renal colic is pain that is caused by passing a kidney stone. The pain can be sharp and severe. It may be felt in the back, abdomen, side (flank), or groin. It can cause nausea. Renal colic can come and go. HOME CARE INSTRUCTIONS Watch your condition for any changes. The following actions may help to lessen any discomfort that you are feeling:  Take medicines only as directed by your health care provider.  Ask your health care provider if it is okay to take over-the-counter pain medicine.  Drink enough fluid to keep your urine clear or pale yellow. Drink 6-8 glasses of water each day.  Limit the amount of salt that you eat to less than 2 grams per day.  Reduce the amount of protein in your diet. Eat less meat, fish, nuts, and dairy.  Avoid foods such as spinach, rhubarb, nuts, or bran. These  may make kidney stones more likely to form. SEEK MEDICAL CARE IF:  You have a fever or chills.  Your urine smells bad or looks cloudy.  You have pain or burning when you pass urine. SEEK IMMEDIATE MEDICAL CARE IF:  Your flank pain or groin pain suddenly worsens.  You become confused or disoriented or you lose consciousness.   This information is not intended to replace advice given to you by  your health care provider. Make sure you discuss any questions you have with your health care provider.   Document Released: 09/17/2005 Document Revised: 12/29/2014 Document Reviewed: 10/18/2014 Elsevier Interactive Patient Education 2016 [employer] Renal Colic Renal colic is pain that is caused by passing a kidney stone. The pain can be sharp and severe. It may be felt in the back, abdomen, side (flank), or groin. It can cause nausea. Renal colic can come and go. HOME CARE INSTRUCTIONS Watch your condition for any changes. The following actions may help to lessen any discomfort that you are feeling:  Take medicines only as directed by your health care provider.  Ask your health care provider if it is okay to take over-the-counter pain medicine.  Drink enough fluid to keep your urine clear or pale yellow. Drink 6-8 glasses of water each day.  Limit the amount of salt that you eat to less than 2 grams per day.  Reduce the amount of protein in your diet. Eat less meat, fish, nuts, and dairy.  Avoid foods such as spinach, rhubarb, nuts, or bran. These may make kidney stones more likely to form. SEEK MEDICAL CARE IF:  You have a fever or chills.  Your urine smells bad or looks cloudy.  You have pain or burning when you pass urine. SEEK IMMEDIATE MEDICAL CARE IF:  Your flank pain or groin pain suddenly worsens.  You become confused or disoriented or you lose consciousness.   This information is not intended to replace advice given to you by your health care provider. Make sure you  discuss any questions you have with your health care provider.   Document Released: 09/17/2005 Document Revised: 12/29/2014 Document Reviewed: 10/18/2014 Elsevier Interactive Patient Education 2016 [employer] Renal Colic Renal colic is pain that is caused by passing a kidney stone. The pain can be sharp and severe. It may be felt in the back, abdomen, side (flank), or groin. It can cause nausea. Renal colic can come and go. HOME CARE INSTRUCTIONS Watch your condition for any changes. The following actions may help to lessen any discomfort that you are feeling:  Take medicines only as directed by your health care provider.  Ask your health care provider if it is okay to take over-the-counter pain medicine.  Drink enough fluid to keep your urine clear or pale yellow. Drink 6-8 glasses of water each day.  Limit the amount of salt that you eat to less than 2 grams per day.  Reduce the amount of protein in your diet. Eat less meat, fish, nuts, and dairy.  Avoid foods such as spinach, rhubarb, nuts, or bran. These may make kidney stones more likely to form. SEEK MEDICAL CARE IF:  You have a fever or chills.  Your urine smells bad or looks cloudy.  You have pain or burning when you pass urine. SEEK IMMEDIATE MEDICAL CARE IF:  Your flank pain or groin pain suddenly worsens.  You become confused or disoriented or you lose consciousness.   This information is not intended to replace advice given to you by your health care provider. Make sure you discuss any questions you have with your health care provider.   Document Released: 09/17/2005 Document Revised: 12/29/2014 Document Reviewed: 10/18/2014 Elsevier Interactive Patient Education 2016 [employer] Renal Colic Renal colic is pain that is caused by passing a kidney stone. The pain can be sharp and severe. It may be felt in the back, abdomen, side (flank), or groin. It can cause nausea. Renal  colic can come and go. HOME CARE  INSTRUCTIONS Watch your condition for any changes. The following actions may help to lessen any discomfort that you are feeling:  Take medicines only as directed by your health care provider.  Ask your health care provider if it is okay to take over-the-counter pain medicine.  Drink enough fluid to keep your urine clear or pale yellow. Drink 6-8 glasses of water each day.  Limit the amount of salt that you eat to less than 2 grams per day.  Reduce the amount of protein in your diet. Eat less meat, fish, nuts, and dairy.  Avoid foods such as spinach, rhubarb, nuts, or bran. These may make kidney stones more likely to form. SEEK MEDICAL CARE IF:  You have a fever or chills.  Your urine smells bad or looks cloudy.  You have pain or burning when you pass urine. SEEK IMMEDIATE MEDICAL CARE IF:  Your flank pain or groin pain suddenly worsens.  You become confused or disoriented or you lose consciousness.   This information is not intended to replace advice given to you by your health care provider. Make sure you discuss any questions you have with your health care provider.   Document Released: 09/17/2005 Document Revised: 12/29/2014 Document Reviewed: 10/18/2014 Elsevier Interactive Patient Education 2016 [employer] Renal Colic Renal colic is pain that is caused by passing a kidney stone. The pain can be sharp and severe. It may be felt in the back, abdomen, side (flank), or groin. It can cause nausea. Renal colic can come and go. HOME CARE INSTRUCTIONS Watch your condition for any changes. The following actions may help to lessen any discomfort that you are feeling:  Take medicines only as directed by your health care provider.  Ask your health care provider if it is okay to take over-the-counter pain medicine.  Drink enough fluid to keep your urine clear or pale yellow. Drink 6-8 glasses of water each day.  Limit the amount of salt that you eat to less than 2 grams per  day.  Reduce the amount of protein in your diet. Eat less meat, fish, nuts, and dairy.  Avoid foods such as spinach, rhubarb, nuts, or bran. These may make kidney stones more likely to form. SEEK MEDICAL CARE IF:  You have a fever or chills.  Your urine smells bad or looks cloudy.  You have pain or burning when you pass urine. SEEK IMMEDIATE MEDICAL CARE IF:  Your flank pain or groin pain suddenly worsens.  You become confused or disoriented or you lose consciousness.   This information is not intended to replace advice given to you by your health care provider. Make sure you discuss any questions you have with your health care provider.   Document Released: 09/17/2005 Document Revised: 12/29/2014 Document Reviewed: 10/18/2014 Elsevier Interactive Patient Education 2016 [employer] Renal Colic Renal colic is pain that is caused by passing a kidney stone. The pain can be sharp and severe. It may be felt in the back, abdomen, side (flank), or groin. It can cause nausea. Renal colic can come and go. HOME CARE INSTRUCTIONS Watch your condition for any changes. The following actions may help to lessen any discomfort that you are feeling:  Take medicines only as directed by your health care provider.  Ask your health care provider if it is okay to take over-the-counter pain medicine.  Drink enough fluid to keep your urine clear or pale yellow. Drink 6-8 glasses of water each day.  Limit the amount of salt that you eat to less than 2 grams per day.  Reduce the amount of protein in your diet. Eat less meat, fish, nuts, and dairy.  Avoid foods such as spinach, rhubarb, nuts, or bran. These may make kidney stones more likely to form. SEEK MEDICAL CARE IF:  You have a fever or chills.  Your urine smells bad or looks cloudy.  You have pain or burning when you pass urine. SEEK IMMEDIATE MEDICAL CARE IF:  Your flank pain or groin pain suddenly worsens.  You become confused or  disoriented or you lose consciousness.   This information is not intended to replace advice given to you by your health care provider. Make sure you discuss any questions you have with your health care provider.   Document Released: 09/17/2005 Document Revised: 12/29/2014 Document Reviewed: 10/18/2014 Elsevier Interactive Patient Education 2016 [employer] Renal Colic Renal colic is pain that is caused by passing a kidney stone. The pain can be sharp and severe. It may be felt in the back, abdomen, side (flank), or groin. It can cause nausea. Renal colic can come and go. HOME CARE INSTRUCTIONS Watch your condition for any changes. The following actions may help to lessen any discomfort that you are feeling:  Take medicines only as directed by your health care provider.  Ask your health care provider if it is okay to take over-the-counter pain medicine.  Drink enough fluid to keep your urine clear or pale yellow. Drink 6-8 glasses of water each day.  Limit the amount of salt that you eat to less than 2 grams per day.  Reduce the amount of protein in your diet. Eat less meat, fish, nuts, and dairy.  Avoid foods such as spinach, rhubarb, nuts, or bran. These may make kidney stones more likely to form. SEEK MEDICAL CARE IF:  You have a fever or chills.  Your urine smells bad or looks cloudy.  You have pain or burning when you pass urine. SEEK IMMEDIATE MEDICAL CARE IF:  Your flank pain or groin pain suddenly worsens.  You become confused or disoriented or you lose consciousness.   This information is not intended to replace advice given to you by your health care provider. Make sure you discuss any questions you have with your health care provider.   Document Released: 09/17/2005 Document Revised: 12/29/2014 Document Reviewed: 10/18/2014 Elsevier Interactive Patient Education 2016 [employer] Renal Colic Renal colic is pain that is caused by passing a kidney stone. The pain  can be sharp and severe. It may be felt in the back, abdomen, side (flank), or groin. It can cause nausea. Renal colic can come and go. HOME CARE INSTRUCTIONS Watch your condition for any changes. The following actions may help to lessen any discomfort that you are feeling:  Take medicines only as directed by your health care provider.  Ask your health care provider if it is okay to take over-the-counter pain medicine.  Drink enough fluid to keep your urine clear or pale yellow. Drink 6-8 glasses of water each day.  Limit the amount of salt that you eat to less than 2 grams per day.  Reduce the amount of protein in your diet. Eat less meat, fish, nuts, and dairy.  Avoid foods such as spinach, rhubarb, nuts, or bran. These may make kidney stones more likely to form. SEEK MEDICAL CARE IF:  You have a fever or chills.  Your urine smells bad or looks cloudy.  You have  pain or burning when you pass urine. SEEK IMMEDIATE MEDICAL CARE IF:  Your flank pain or groin pain suddenly worsens.  You become confused or disoriented or you lose consciousness.   This information is not intended to replace advice given to you by your health care provider. Make sure you discuss any questions you have with your health care provider.   Document Released: 09/17/2005 Document Revised: 12/29/2014 Document Reviewed: 10/18/2014 Elsevier Interactive Patient Education 2016 [employer] Renal Colic Renal colic is pain that is caused by passing a kidney stone. The pain can be sharp and severe. It may be felt in the back, abdomen, side (flank), or groin. It can cause nausea. Renal colic can come and go. HOME CARE INSTRUCTIONS Watch your condition for any changes. The following actions may help to lessen any discomfort that you are feeling:  Take medicines only as directed by your health care provider.  Ask your health care provider if it is okay to take over-the-counter pain medicine.  Drink enough fluid  to keep your urine clear or pale yellow. Drink 6-8 glasses of water each day.  Limit the amount of salt that you eat to less than 2 grams per day.  Reduce the amount of protein in your diet. Eat less meat, fish, nuts, and dairy.  Avoid foods such as spinach, rhubarb, nuts, or bran. These may make kidney stones more likely to form. SEEK MEDICAL CARE IF:  You have a fever or chills.  Your urine smells bad or looks cloudy.  You have pain or burning when you pass urine. SEEK IMMEDIATE MEDICAL CARE IF:  Your flank pain or groin pain suddenly worsens.  You become confused or disoriented or you lose consciousness.   This information is not intended to replace advice given to you by your health care provider. Make sure you discuss any questions you have with your health care provider.   Document Released: 09/17/2005 Document Revised: 12/29/2014 Document Reviewed: 10/18/2014 Elsevier Interactive Patient Education 2016 [employer] Renal Colic Renal colic is pain that is caused by passing a kidney stone. The pain can be sharp and severe. It may be felt in the back, abdomen, side (flank), or groin. It can cause nausea. Renal colic can come and go. HOME CARE INSTRUCTIONS Watch your condition for any changes. The following actions may help to lessen any discomfort that you are feeling:  Take medicines only as directed by your health care provider.  Ask your health care provider if it is okay to take over-the-counter pain medicine.  Drink enough fluid to keep your urine clear or pale yellow. Drink 6-8 glasses of water each day.  Limit the amount of salt that you eat to less than 2 grams per day.  Reduce the amount of protein in your diet. Eat less meat, fish, nuts, and dairy.  Avoid foods such as spinach, rhubarb, nuts, or bran. These may make kidney stones more likely to form. SEEK MEDICAL CARE IF:  You have a fever or chills.  Your urine smells bad or looks cloudy.  You have pain  or burning when you pass urine. SEEK IMMEDIATE MEDICAL CARE IF:  Your flank pain or groin pain suddenly worsens.  You become confused or disoriented or you lose consciousness.   This information is not intended to replace advice given to you by your health care provider. Make sure you discuss any questions you have with your health care provider.   Document Released: 09/17/2005 Document Revised: 12/29/2014 Document Reviewed:  10/18/2014 Elsevier Interactive Patient Education 2016 [employer] Renal Colic Renal colic is pain that is caused by passing a kidney stone. The pain can be sharp and severe. It may be felt in the back, abdomen, side (flank), or groin. It can cause nausea. Renal colic can come and go. HOME CARE INSTRUCTIONS Watch your condition for any changes. The following actions may help to lessen any discomfort that you are feeling:  Take medicines only as directed by your health care provider.  Ask your health care provider if it is okay to take over-the-counter pain medicine.  Drink enough fluid to keep your urine clear or pale yellow. Drink 6-8 glasses of water each day.  Limit the amount of salt that you eat to less than 2 grams per day.  Reduce the amount of protein in your diet. Eat less meat, fish, nuts, and dairy.  Avoid foods such as spinach, rhubarb, nuts, or bran. These may make kidney stones more likely to form. SEEK MEDICAL CARE IF:  You have a fever or chills.  Your urine smells bad or looks cloudy.  You have pain or burning when you pass urine. SEEK IMMEDIATE MEDICAL CARE IF:  Your flank pain or groin pain suddenly worsens.  You become confused or disoriented or you lose consciousness.   This information is not intended to replace advice given to you by your health care provider. Make sure you discuss any questions you have with your health care provider.   Document Released: 09/17/2005 Document Revised: 12/29/2014 Document Reviewed:  10/18/2014 Elsevier Interactive Patient Education 2016 [employer] Renal Colic Renal colic is pain that is caused by passing a kidney stone. The pain can be sharp and severe. It may be felt in the back, abdomen, side (flank), or groin. It can cause nausea. Renal colic can come and go. HOME CARE INSTRUCTIONS Watch your condition for any changes. The following actions may help to lessen any discomfort that you are feeling:  Take medicines only as directed by your health care provider.  Ask your health care provider if it is okay to take over-the-counter pain medicine.  Drink enough fluid to keep your urine clear or pale yellow. Drink 6-8 glasses of water each day.  Limit the amount of salt that you eat to less than 2 grams per day.  Reduce the amount of protein in your diet. Eat less meat, fish, nuts, and dairy.  Avoid foods such as spinach, rhubarb, nuts, or bran. These may make kidney stones more likely to form. SEEK MEDICAL CARE IF:  You have a fever or chills.  Your urine smells bad or looks cloudy.  You have pain or burning when you pass urine. SEEK IMMEDIATE MEDICAL CARE IF:  Your flank pain or groin pain suddenly worsens.  You become confused or disoriented or you lose consciousness.   This information is not intended to replace advice given to you by your health care provider. Make sure you discuss any questions you have with your health care provider.   Document Released: 09/17/2005 Document Revised: 12/29/2014 Document Reviewed: 10/18/2014 Elsevier Interactive Patient Education 2016 [employer] Renal Colic Renal colic is pain that is caused by passing a kidney stone. The pain can be sharp and severe. It may be felt in the back, abdomen, side (flank), or groin. It can cause nausea. Renal colic can come and go. HOME CARE INSTRUCTIONS Watch your condition for any changes. The following actions may help to lessen any discomfort that you are feeling:  Take medicines  only as directed by your health care provider.  Ask your health care provider if it is okay to take over-the-counter pain medicine.  Drink enough fluid to keep your urine clear or pale yellow. Drink 6-8 glasses of water each day.  Limit the amount of salt that you eat to less than 2 grams per day.  Reduce the amount of protein in your diet. Eat less meat, fish, nuts, and dairy.  Avoid foods such as spinach, rhubarb, nuts, or bran. These may make kidney stones more likely to form. SEEK MEDICAL CARE IF:  You have a fever or chills.  Your urine smells bad or looks cloudy.  You have pain or burning when you pass urine. SEEK IMMEDIATE MEDICAL CARE IF:  Your flank pain or groin pain suddenly worsens.  You become confused or disoriented or you lose consciousness.   This information is not intended to replace advice given to you by your health care provider. Make sure you discuss any questions you have with your health care provider.   Document Released: 09/17/2005 Document Revised: 12/29/2014 Document Reviewed: 10/18/2014 Elsevier Interactive Patient Education 2016 [employer] Renal Colic Renal colic is pain that is caused by passing a kidney stone. The pain can be sharp and severe. It may be felt in the back, abdomen, side (flank), or groin. It can cause nausea. Renal colic can come and go. HOME CARE INSTRUCTIONS Watch your condition for any changes. The following actions may help to lessen any discomfort that you are feeling:  Take medicines only as directed by your health care provider.  Ask your health care provider if it is okay to take over-the-counter pain medicine.  Drink enough fluid to keep your urine clear or pale yellow. Drink 6-8 glasses of water each day.  Limit the amount of salt that you eat to less than 2 grams per day.  Reduce the amount of protein in your diet. Eat less meat, fish, nuts, and dairy.  Avoid foods such as spinach, rhubarb, nuts, or bran. These  may make kidney stones more likely to form. SEEK MEDICAL CARE IF:  You have a fever or chills.  Your urine smells bad or looks cloudy.  You have pain or burning when you pass urine. SEEK IMMEDIATE MEDICAL CARE IF:  Your flank pain or groin pain suddenly worsens.  You become confused or disoriented or you lose consciousness.   This information is not intended to replace advice given to you by your health care provider. Make sure you discuss any questions you have with your health care provider.   Document Released: 09/17/2005 Document Revised: 12/29/2014 Document Reviewed: 10/18/2014 Elsevier Interactive Patient Education 2016 [employer] Renal Colic Renal colic is pain that is caused by passing a kidney stone. The pain can be sharp and severe. It may be felt in the back, abdomen, side (flank), or groin. It can cause nausea. Renal colic can come and go. HOME CARE INSTRUCTIONS Watch your condition for any changes. The following actions may help to lessen any discomfort that you are feeling:  Take medicines only as directed by your health care provider.  Ask your health care provider if it is okay to take over-the-counter pain medicine.  Drink enough fluid to keep your urine clear or pale yellow. Drink 6-8 glasses of water each day.  Limit the amount of salt that you eat to less than 2 grams per day.  Reduce the amount of protein in your diet. Eat less meat,  fish, nuts, and dairy.  Avoid foods such as spinach, rhubarb, nuts, or bran. These may make kidney stones more likely to form. SEEK MEDICAL CARE IF:  You have a fever or chills.  Your urine smells bad or looks cloudy.  You have pain or burning when you pass urine. SEEK IMMEDIATE MEDICAL CARE IF:  Your flank pain or groin pain suddenly worsens.  You become confused or disoriented or you lose consciousness.   This information is not intended to replace advice given to you by your health care provider. Make sure you  discuss any questions you have with your health care provider.   Document Released: 09/17/2005 Document Revised: 12/29/2014 Document Reviewed: 10/18/2014 Elsevier Interactive Patient Education 2016 [employer] Renal Colic Renal colic is pain that is caused by passing a kidney stone. The pain can be sharp and severe. It may be felt in the back, abdomen, side (flank), or groin. It can cause nausea. Renal colic can come and go. HOME CARE INSTRUCTIONS Watch your condition for any changes. The following actions may help to lessen any discomfort that you are feeling:  Take medicines only as directed by your health care provider.  Ask your health care provider if it is okay to take over-the-counter pain medicine.  Drink enough fluid to keep your urine clear or pale yellow. Drink 6-8 glasses of water each day.  Limit the amount of salt that you eat to less than 2 grams per day.  Reduce the amount of protein in your diet. Eat less meat, fish, nuts, and dairy.  Avoid foods such as spinach, rhubarb, nuts, or bran. These may make kidney stones more likely to form. SEEK MEDICAL CARE IF:  You have a fever or chills.  Your urine smells bad or looks cloudy.  You have pain or burning when you pass urine. SEEK IMMEDIATE MEDICAL CARE IF:  Your flank pain or groin pain suddenly worsens.  You become confused or disoriented or you lose consciousness.   This information is not intended to replace advice given to you by your health care provider. Make sure you discuss any questions you have with your health care provider.   Document Released: 09/17/2005 Document Revised: 12/29/2014 Document Reviewed: 10/18/2014 Elsevier Interactive Patient Education 2016 [employer] Renal Colic Renal colic is pain that is caused by passing a kidney stone. The pain can be sharp and severe. It may be felt in the back, abdomen, side (flank), or groin. It can cause nausea. Renal colic can come and go. HOME CARE  INSTRUCTIONS Watch your condition for any changes. The following actions may help to lessen any discomfort that you are feeling:  Take medicines only as directed by your health care provider.  Ask your health care provider if it is okay to take over-the-counter pain medicine.  Drink enough fluid to keep your urine clear or pale yellow. Drink 6-8 glasses of water each day.  Limit the amount of salt that you eat to less than 2 grams per day.  Reduce the amount of protein in your diet. Eat less meat, fish, nuts, and dairy.  Avoid foods such as spinach, rhubarb, nuts, or bran. These may make kidney stones more likely to form. SEEK MEDICAL CARE IF:  You have a fever or chills.  Your urine smells bad or looks cloudy.  You have pain or burning when you pass urine. SEEK IMMEDIATE MEDICAL CARE IF:  Your flank pain or groin pain suddenly worsens.  You become confused or disoriented  or you lose consciousness.   This information is not intended to replace advice given to you by your health care provider. Make sure you discuss any questions you have with your health care provider.   Document Released: 09/17/2005 Document Revised: 12/29/2014 Document Reviewed: 10/18/2014 [employer] Interactive Patient Education 2016 [employer] Renal Colic Renal colic is pain that is caused by passing a kidney stone. The pain can be sharp and severe. It may be felt in the back, abdomen, side (flank), or groin. It can cause nausea. Renal colic can come and go. HOME CARE INSTRUCTIONS Watch your condition for any changes. The following actions may help to lessen any discomfort that you are feeling:  Take medicines only as directed by your health care provider.  Ask your health care provider if it is okay to take over-the-counter pain medicine.  Drink enough fluid to keep your urine clear or pale yellow. Drink 6-8 glasses of water each day.  Limit the amount of salt that you eat to less than 2 grams per  day.  Reduce the amount of protein in your diet. Eat less meat, fish, nuts, and dairy.  Avoid foods such as spinach, rhubarb, nuts, or bran. These may make kidney stones more likely to form. SEEK MEDICAL CARE IF:  You have a fever or chills.  Your urine smells bad or looks cloudy.  You have pain or burning when you pass urine. SEEK IMMEDIATE MEDICAL CARE IF:  Your flank pain or groin pain suddenly worsens.  You become confused or disoriented or you lose consciousness.   This information is not intended to replace advice given to you by your health care provider. Make sure you discuss any questions you have with your health care provider.   Document Released: 09/17/2005 Document Revised: 12/29/2014 Document Reviewed: 10/18/2014 Elsevier Interactive Patient Education [employer]

## 2015-10-09 NOTE — Telephone Encounter (Signed)
Lori Mann - FYI

## 2015-10-09 NOTE — Progress Notes (Signed)
Subjective:    Patient ID: Lori Mann, female    DOB: 02-26-1991, 24 y.o.   MRN: [id number]  HPI 24 year old patient who was well until 7:30 AM today when she was awoken with abdominal pain on the right side.  Pain is described as severe aching and throbbing and tends to wax and wane.  No nausea or vomiting.  She has also had the onset of some diarrhea today.  No history of renal stone disease.  She was seen at the urgent care earlier.  White blood cell count was normal and apparently urinalysis was negative.  She does take birth control pills and her periods have been regular. No family history of renal stone disease. Pain has now involved the right flank area.  Associated symptoms include some mild dysuria and some voiding difficulties with decrease in the urinary stream  Past Medical History  Diagnosis Date  . H/O infectious mononucleosis   . Allergy     Social History   Social History  . Marital Status: Single    Spouse Name: N/A  . Number of Children: N/A  . Years of Education: N/A   Occupational History  . Not on file.   Social History Main Topics  . Smoking status: Never Smoker   . Smokeless tobacco: Never Used  . Alcohol Use: 0.0 oz/week    0 Standard drinks or equivalent per week  . Drug Use: No  . Sexual Activity: Yes    Birth Control/ Protection: Pill   Other Topics Concern  . Not on file   Social History Narrative    Past Surgical History  Procedure Laterality Date  . Tonsillectomy    . Nasal septum surgery      Family History  Problem Relation Age of Onset  . Cancer Mother     breast ca  . Arthritis Father   . Hyperlipidemia Father     No Known Allergies  Current Outpatient Prescriptions on File Prior to Visit  Medication Sig Dispense Refill  . acetaminophen (TYLENOL) 325 MG tablet Take 650 mg by mouth every 6 (six) hours as needed for pain.    Marland Kitchen escitalopram (LEXAPRO) 10 MG tablet Take 10 mg by mouth daily.  6  . loratadine (CLARITIN)  10 MG tablet Take 10 mg by mouth daily.    . ORTHO-CYCLEN, 28, 0.25-35 MG-MCG tablet Take 1 tablet by mouth daily.  4   No current facility-administered medications on file prior to visit.    BP 136/90 mmHg  Pulse 82  Temp(Src) 98.4 F (36.9 C) (Oral)  Resp 20  Ht  (1.676 m)  Wt 210 lb (95.255 kg)  BMI 33.91 kg/m2  SpO2 97%  LMP 09/29/2015      Review of Systems  Constitutional: Negative.   HENT: Negative for congestion, dental problem, hearing loss, rhinorrhea, sinus pressure, sore throat and tinnitus.   Eyes: Negative for pain, discharge and visual disturbance.  Respiratory: Negative for cough and shortness of breath.   Cardiovascular: Negative for chest pain, palpitations and leg swelling.  Gastrointestinal: Positive for abdominal pain. Negative for nausea, vomiting, diarrhea, constipation, blood in stool and abdominal distention.  Genitourinary: Positive for dysuria and decreased urine volume. Negative for urgency, frequency, hematuria, flank pain, vaginal bleeding, vaginal discharge, difficulty urinating, vaginal pain and pelvic pain.  Musculoskeletal: Negative for joint swelling, arthralgias and gait problem.  Skin: Negative for rash.  Neurological: Negative for dizziness, syncope, speech difficulty, weakness, numbness and headaches.  Hematological: Negative for  adenopathy.  Psychiatric/Behavioral: Negative for behavioral problems, dysphoric mood and agitation. The patient is not nervous/anxious.        Objective:   Physical Exam  Constitutional: She is oriented to person, place, and time. She appears well-developed and well-nourished.  HENT:  Head: Normocephalic.  Right Ear: External ear normal.  Left Ear: External ear normal.  Mouth/Throat: Oropharynx is clear and moist.  Eyes: Conjunctivae and EOM are normal. Pupils are equal, round, and reactive to light.  Neck: Normal range of motion. Neck supple. No thyromegaly present.  Cardiovascular: Normal rate,  regular rhythm, normal heart sounds and intact distal pulses.   Pulmonary/Chest: Effort normal and breath sounds normal.  Abdominal: Soft. Bowel sounds are normal. She exhibits no distension and no mass. There is no tenderness. There is no rebound and no guarding.  Bowel sounds are active No guarding or rebound Very minimal right CVA tenderness  Musculoskeletal: Normal range of motion.  Lymphadenopathy:    She has no cervical adenopathy.  Neurological: She is alert and oriented to person, place, and time.  Skin: Skin is warm and dry. No rash noted.  Psychiatric: She has a normal mood and affect. Her behavior is normal.          Assessment & Plan:   Suspect renal colic.  Will set up for a abdominal/pelvic CT scan stone protocol Will force fluids and treat symptomatically with Vicodin  Patient report any clinical worsening

## 2015-10-09 NOTE — Patient Instructions (Signed)
Please take tylenol for the pain.  If you exhibit increased pain, uncontrollable nausea and vomiting, fever, dizziness, than you need to return here or to the ED.   We then can recheck this blood count for infection, anemia, and redo the urine.   Please push fluids on yourself.  Drink as much water as possible.  64oz of water per day is the normal, and at this time you should be doing more.

## 2015-10-09 NOTE — Progress Notes (Signed)
[facility] [address], [place] [id number] [phone]  Date:  10/09/2015   Name:  Lori Mann   DOB:  March 02, 1991   MRN:  [id number]  PCP:  Rogelia BogaKWIATKOWSKI,PETER FRANK, MD    History of Present Illness:  Lori Mann is a 24 y.o. female patient [facility]q abdominal pain since this morning.  Upon waking, patient reports a stabbing pain at her right lower abdomen pain that radiates through to the back.  Aggravated with bending over.  Pain is intermittent.   She also has noticed that she has difficulty with urination.  Pain with urination when she is able and very little.  No hematuria or frequency, however there is urgency despite inability to urinate.  She had one episode of diarrhea this morning.  No blood in the stool.  She admits to not hydrating well at all.  Patient has no nausea, or fever, however feels clammy.  She has dizziness.    She has taken tylenol which has helped some with the pain.  No constipation.  No vaginal changes of abnormal discharge, odor, or rash.  No abnormal bleeding. Patient's last menstrual period was 10/06/2015.  Sexually active both protected and unprotected.  Compliant on tricyclen.    Patient Active Problem List   Diagnosis Date Noted  . OSA (obstructive sleep apnea) 09/25/2015    Past Medical History  Diagnosis Date  . H/O infectious mononucleosis   . Allergy     Past Surgical History  Procedure Laterality Date  . Tonsillectomy    . Nasal septum surgery      Social History  Substance Use Topics  . Smoking status: Never Smoker   . Smokeless tobacco: Never Used  . Alcohol Use: 0.0 oz/week    0 Standard drinks or equivalent per week    Family History  Problem Relation Age of Onset  . Cancer Mother     breast ca  . Arthritis Father   . Hyperlipidemia Father     No Known Allergies  Medication list has been reviewed and updated.  Current Outpatient Prescriptions on File Prior to  Visit  Medication Sig Dispense Refill  . acetaminophen (TYLENOL) 325 MG tablet Take 650 mg by mouth every 6 (six) hours as needed for pain.    Marland Kitchen. escitalopram (LEXAPRO) 10 MG tablet Take 10 mg by mouth daily.  6  . loratadine (CLARITIN) 10 MG tablet Take 10 mg by mouth daily.    . ORTHO-CYCLEN, 28, 0.25-35 MG-MCG tablet Take 1 tablet by mouth daily.  4   No current facility-administered medications on file prior to visit.    ROS ROS otherwise unremarkable unless listed above.   Physical Examination: BP 110/80 mmHg  Pulse 83  Temp(Src) 98.1 F (36.7 C) (Oral)  Resp 18  Ht 5\' 6"  (1.676 m)  Wt 209 lb (94.802 kg)  BMI 33.75 kg/m2  SpO2 98%  LMP 10/06/2015 Ideal Body Weight: Weight in (lb) to have BMI = 25: 154.6  Physical Exam  Constitutional: She is oriented to person, place, and time. She appears well-developed and well-nourished. No distress.  HENT:  Head: Normocephalic and atraumatic.  Right Ear: External ear normal.  Left Ear: External ear normal.  Eyes: Conjunctivae and EOM are normal. Pupils are equal, round, and reactive to light.  Cardiovascular: Normal rate.   Pulmonary/Chest: Effort normal. No respiratory distress.  Abdominal: Soft. Normal appearance and bowel sounds are normal. There is  no hepatosplenomegaly. There is tenderness in the right lower quadrant, suprapubic area and left lower quadrant. There is no CVA tenderness and negative Murphy's sign.  Tenderness in the rlq area consistent with mcburney's.  Genitourinary: Vagina normal and uterus normal. Pelvic exam was performed with patient supine. Cervix exhibits no motion tenderness, no discharge and no friability. Right adnexum displays no mass, no tenderness and no fullness. Left adnexum displays no mass, no tenderness and no fullness. No erythema, tenderness or bleeding in the vagina. No signs of injury around the vagina. No vaginal discharge found.  Neurological: She is alert and oriented to person, place, and  time.  Skin: She is not diaphoretic.  Psychiatric: She has a normal mood and affect. Her behavior is normal.    Results for orders placed or performed in visit on 10/09/15  POCT urine pregnancy  Result Value Ref Range   Preg Test, Ur Negative Negative  POCT urinalysis dipstick  Result Value Ref Range   Color, UA yellow yellow   Clarity, UA clear clear   Glucose, UA negative negative   Bilirubin, UA small (A) negative   Ketones, POC UA negative negative   Spec Grav, UA >=1.030    Blood, UA negative negative   pH, UA 5.0    Protein Ur, POC trace (A) negative   Urobilinogen, UA 0.2    Nitrite, UA Negative Negative   Leukocytes, UA Negative Negative  POCT Microscopic Urinalysis ([facility])  Result Value Ref Range   WBC,UR,HPF,POC None None WBC/hpf   RBC,UR,HPF,POC None None RBC/hpf   Bacteria Moderate (A) None   Mucus Present (A) Absent   Epithelial Cells, UR Per Microscopy Few (A) None cells/hpf  POCT CBC  Result Value Ref Range   WBC 7.1 4.6 - 10.2 K/uL   Lymph, poc 2.5 0.6 - 3.4   POC LYMPH PERCENT 35.0 10 - 50 %L   MID (cbc) 0.5 0 - 0.9   POC MID % 6.9 0 - 12 %M   POC Granulocyte 4.1 2 - 6.9   Granulocyte percent 58.1 37 - 80 %G   RBC 4.19 4.04 - 5.48 M/uL   Hemoglobin 12.6 12.2 - 16.2 g/dL   HCT, POC 21.3 (A) 08.6 - 47.9 %   MCV 86.9 80 - 97 fL   MCH, POC 30.1 27 - 31.2 pg   MCHC 34.7 31.8 - 35.4 g/dL   RDW, POC 57.8 %   Platelet Count, POC 333 142 - 424 K/uL   MPV 6.4 0 - 99.8 fL    Assessment and Plan: DHYANA BASTONE is a 24 y.o. female who is here today for rlq pain for 5 hours.  Vitals and above labs are insignificant.  Possible virus, nephrolithiasis, appendicitis, ovarian cyst.   She will continue the tylenol for pain.  Have advised her to hydrate considerably at this time, and return today if pain worsens.  Can likely redo cbc and ua to see any changes.  We will watchfully wait at this time.  Likely "anuria" was her lack of hydration at this time.   RLQ  abdominal pain - Plan: POCT urine pregnancy, POCT urinalysis dipstick, POCT Microscopic Urinalysis ([facility]), POCT CBC, Urine culture, COMPLETE METABOLIC PANEL WITH GFR  Abdominal pain, chronic, right lower quadrant  Anuria - Plan: POCT urinalysis dipstick, POCT Microscopic Urinalysis ([facility]), POCT CBC, COMPLETE METABOLIC PANEL WITH GFR   There are no diagnoses linked to this encounter.  Trena Platt, PA-C [facility] 10/09/2015 10:15 AM

## 2015-10-10 ENCOUNTER — Other Ambulatory Visit: Payer: Self-pay | Admitting: Urology

## 2015-10-10 LAB — URINE CULTURE: Colony Count: 5000

## 2015-10-10 NOTE — H&P (Signed)
H&P  Chief Complaint: Kidney stone  History of Present Illness: Lori Mann is a 24 y.o. year old female who presents at this time for ureteroscopic management of a right distal ureteral stone as well as a right lower pole stone. She presented to my office yesterday with a 24-hour history of right flank pain, nausea and vomiting. She had a CT the abdomen and pelvis which revealed a 5 mm right distal ureteral stone, proximal right hydroureteronephrosis and an 8 mm right lower pole stone. She presents at this time for ureteroscopic management. Risks and complications of the procedure have been discussed at length with the patient who understands.   Past Medical History  Diagnosis Date  . H/O infectious mononucleosis   . Allergy     Past Surgical History  Procedure Laterality Date  . Tonsillectomy    . Nasal septum surgery      Home Medications:  No prescriptions prior to admission    Allergies: No Known Allergies  Family History  Problem Relation Age of Onset  . Cancer Mother     breast ca  . Arthritis Father   . Hyperlipidemia Father     Social History:  reports that she has never smoked. She has never used smokeless tobacco. She reports that she drinks alcohol. She reports that she does not use illicit drugs.  ROS: A complete review of systems was performed.  All systems are negative except for pertinent findings as noted.  Physical Exam:  Vital signs in last 24 hours:   General:  Alert and oriented, No acute distress HEENT: Normocephalic, atraumatic Neck: No JVD or lymphadenopathy Cardiovascular: Regular rate and rhythm Lungs: Clear bilaterally Abdomen: Soft, nontender, nondistended, no abdominal masses Back: No CVA tenderness Extremities: No edema Neurologic: Grossly intact  Laboratory Data:  No results found for this or any previous visit (from the past 24 hour(s)). Recent Results (from the past 240 hour(s))  Urine culture     Status: None   Collection  Time: 10/09/15 11:59 AM  Result Value Ref Range Status   Colony Count 5,000 COLONIES/ML  Final   Organism ID, Bacteria Insignificant Growth  Final   Creatinine:  Recent Labs  10/09/15 1159  CREATININE 0.89    Radiologic Imaging: Ct Renal Stone Study  10/09/2015  CLINICAL DATA:  Acute right flank and lower quadrant abdominal pain. EXAM: CT ABDOMEN AND PELVIS WITHOUT CONTRAST TECHNIQUE: Multidetector CT imaging of the abdomen and pelvis was performed following the standard protocol without IV contrast. COMPARISON:  None. FINDINGS: Visualized lung bases appear normal. No significant osseous abnormality is noted. No gallstones are noted. No focal abnormality is noted in the liver, spleen or pancreas on these unenhanced images. Adrenal glands appear normal. Small nonobstructive calculus is noted in left kidney. No hydronephrosis or renal obstruction is seen on the left. 8 mm nonobstructive calculus is noted in lower pole collecting system of right kidney. Mild right hydroureteronephrosis is noted with renal enlargement and minimal perinephric stranding secondary to 6 mm calculus at right ureterovesical junction. The appendix appears normal. There is no evidence of bowel obstruction. Uterus and ovaries appear normal. No significant adenopathy is noted. No abnormal fluid collection is noted. IMPRESSION: Bilateral nephrolithiasis. Mild right hydroureteronephrosis is noted secondary to 6 mm calculus at right ureterovesical junction. Electronically Signed   By: Lupita Raider, M.D.   On: 10/09/2015 16:10    Impression/Assessment Right lower pole calculus, right lower ureteral calculus   Plan:  Cystoscopy, right retrograde ureteropyelogram,  right ureteroscopy with holmium laser lithotripsy and extraction of right lower ureteral and right lower pole calculi Hanson Medeiros M 10/10/2015, 5:49 PM  Bertram MillardStephen M. Taiven Greenley MD

## 2015-10-10 NOTE — Telephone Encounter (Signed)
Pt is calling  Back to inform md she is still having pain. Pt was dx with kidney stone and seen yesterdat. Per pt was told to callback in morning if she having pain. Please advise

## 2015-10-10 NOTE — Telephone Encounter (Signed)
Discussed pt's condition with Dr. Kirtland BouchardK, verbal order given for urgent referral to Urology today. Gavin PoundDeborah notified urgent referral put in.  Called pt and told her Dr.K wants her to see Urology today and I put urgent referral in and someone will contact you with an appt. Pt verbalized understanding.

## 2015-10-11 ENCOUNTER — Ambulatory Visit (HOSPITAL_BASED_OUTPATIENT_CLINIC_OR_DEPARTMENT_OTHER): Payer: Managed Care, Other (non HMO) | Admitting: Anesthesiology

## 2015-10-11 ENCOUNTER — Encounter (HOSPITAL_BASED_OUTPATIENT_CLINIC_OR_DEPARTMENT_OTHER)
Admission: RE | Disposition: A | Discharge status: Home / Self Care | Payer: Self-pay | Source: Ambulatory Visit | Attending: Urology

## 2015-10-11 ENCOUNTER — Encounter (HOSPITAL_BASED_OUTPATIENT_CLINIC_OR_DEPARTMENT_OTHER): Payer: Self-pay

## 2015-10-11 ENCOUNTER — Ambulatory Visit (HOSPITAL_BASED_OUTPATIENT_CLINIC_OR_DEPARTMENT_OTHER)
Admission: RE | Admit: 2015-10-11 | Discharge: 2015-10-11 | Disposition: A | Discharge status: Home / Self Care | Payer: Managed Care, Other (non HMO) | Source: Ambulatory Visit | Attending: Urology | Admitting: Urology

## 2015-10-11 DIAGNOSIS — Z6833 Body mass index (BMI) 33.0-33.9, adult: Secondary | ICD-10-CM | POA: Diagnosis not present

## 2015-10-11 DIAGNOSIS — E669 Obesity, unspecified: Secondary | ICD-10-CM | POA: Insufficient documentation

## 2015-10-11 DIAGNOSIS — N202 Calculus of kidney with calculus of ureter: Secondary | ICD-10-CM | POA: Diagnosis not present

## 2015-10-11 DIAGNOSIS — G4730 Sleep apnea, unspecified: Secondary | ICD-10-CM | POA: Insufficient documentation

## 2015-10-11 DIAGNOSIS — N200 Calculus of kidney: Secondary | ICD-10-CM | POA: Diagnosis not present

## 2015-10-11 HISTORY — PX: CYSTOSCOPY WITH RETROGRADE PYELOGRAM, URETEROSCOPY AND STENT PLACEMENT: SHX5789

## 2015-10-11 HISTORY — PX: HOLMIUM LASER APPLICATION: SHX5852

## 2015-10-11 LAB — POCT HEMOGLOBIN-HEMACUE: HEMOGLOBIN: 12.3 g/dL (ref 12.0–15.0)

## 2015-10-11 SURGERY — CYSTOURETEROSCOPY, WITH RETROGRADE PYELOGRAM AND STENT INSERTION
Anesthesia: General | Site: Renal | Laterality: Right

## 2015-10-11 MED ORDER — SULFAMETHOXAZOLE-TRIMETHOPRIM 800-160 MG PO TABS: 1.0000 | ORAL_TABLET | Freq: Two times a day (BID) | ORAL | Status: DC

## 2015-10-11 MED ORDER — OXYBUTYNIN CHLORIDE 5 MG PO TABS: 5.0000 mg | ORAL_TABLET | Freq: Three times a day (TID) | ORAL | Status: DC | PRN

## 2015-10-11 MED ORDER — PROPOFOL 10 MG/ML IV BOLUS
INTRAVENOUS | Status: DC | PRN
  Administered 2015-10-11: 180 mg via INTRAVENOUS

## 2015-10-11 MED ORDER — OXYCODONE HCL 5 MG PO TABS
ORAL_TABLET | ORAL | Status: AC
  Filled 2015-10-11: qty 1

## 2015-10-11 MED ORDER — CEFAZOLIN SODIUM-DEXTROSE 2-3 GM-% IV SOLR
2.0000 g | INTRAVENOUS | Status: AC
  Administered 2015-10-11: 2 g via INTRAVENOUS
  Filled 2015-10-11: qty 50

## 2015-10-11 MED ORDER — LIDOCAINE HCL (CARDIAC) 20 MG/ML IV SOLN
INTRAVENOUS | Status: DC | PRN
  Administered 2015-10-11: 80 mg via INTRAVENOUS

## 2015-10-11 MED ORDER — CEFAZOLIN SODIUM 1-5 GM-% IV SOLN
1.0000 g | INTRAVENOUS | Status: DC
  Filled 2015-10-11: qty 50

## 2015-10-11 MED ORDER — FENTANYL CITRATE (PF) 100 MCG/2ML IJ SOLN
INTRAMUSCULAR | Status: AC
Start: 2015-10-11 — End: 2015-10-11
  Filled 2015-10-11: qty 6

## 2015-10-11 MED ORDER — OXYBUTYNIN CHLORIDE 5 MG PO TABS
5.0000 mg | ORAL_TABLET | Freq: Three times a day (TID) | ORAL | Status: DC
  Administered 2015-10-11: 5 mg via ORAL
  Filled 2015-10-11: qty 1

## 2015-10-11 MED ORDER — LACTATED RINGERS IV SOLN
INTRAVENOUS | Status: DC
  Administered 2015-10-11 (×2): via INTRAVENOUS
  Filled 2015-10-11: qty 1000

## 2015-10-11 MED ORDER — DEXAMETHASONE SODIUM PHOSPHATE 4 MG/ML IJ SOLN
INTRAMUSCULAR | Status: DC | PRN
  Administered 2015-10-11: 10 mg via INTRAVENOUS

## 2015-10-11 MED ORDER — OXYBUTYNIN CHLORIDE 5 MG PO TABS
ORAL_TABLET | ORAL | Status: AC
  Filled 2015-10-11: qty 1

## 2015-10-11 MED ORDER — MEPERIDINE HCL 25 MG/ML IJ SOLN
6.2500 mg | INTRAMUSCULAR | Status: DC | PRN
  Filled 2015-10-11: qty 1

## 2015-10-11 MED ORDER — BELLADONNA ALKALOIDS-OPIUM 16.2-60 MG RE SUPP
RECTAL | Status: AC
Start: 2015-10-11 — End: 2015-10-11
  Filled 2015-10-11: qty 1

## 2015-10-11 MED ORDER — ONDANSETRON HCL 4 MG/2ML IJ SOLN
INTRAMUSCULAR | Status: DC | PRN
  Administered 2015-10-11: 4 mg via INTRAVENOUS

## 2015-10-11 MED ORDER — HYDROMORPHONE HCL 1 MG/ML IJ SOLN
0.2500 mg | INTRAMUSCULAR | Status: DC | PRN
  Administered 2015-10-11 (×2): 0.25 mg via INTRAVENOUS
  Filled 2015-10-11: qty 1

## 2015-10-11 MED ORDER — OXYCODONE HCL 5 MG/5ML PO SOLN
5.0000 mg | Freq: Once | ORAL | Status: AC | PRN
  Filled 2015-10-11: qty 5

## 2015-10-11 MED ORDER — FENTANYL CITRATE (PF) 100 MCG/2ML IJ SOLN
INTRAMUSCULAR | Status: DC | PRN
  Administered 2015-10-11: 25 ug via INTRAVENOUS
  Administered 2015-10-11: 50 ug via INTRAVENOUS
  Administered 2015-10-11: 25 ug via INTRAVENOUS

## 2015-10-11 MED ORDER — HYDROMORPHONE HCL 1 MG/ML IJ SOLN
INTRAMUSCULAR | Status: AC
  Filled 2015-10-11: qty 1

## 2015-10-11 MED ORDER — SODIUM CHLORIDE 0.9 % IR SOLN
Status: DC | PRN
  Administered 2015-10-11: 4000 mL

## 2015-10-11 MED ORDER — MIDAZOLAM HCL 2 MG/2ML IJ SOLN
INTRAMUSCULAR | Status: AC
  Filled 2015-10-11: qty 2

## 2015-10-11 MED ORDER — OXYCODONE HCL 5 MG PO TABS
5.0000 mg | ORAL_TABLET | Freq: Once | ORAL | Status: AC | PRN
  Administered 2015-10-11: 5 mg via ORAL
  Filled 2015-10-11: qty 1

## 2015-10-11 MED ORDER — MIDAZOLAM HCL 5 MG/5ML IJ SOLN
INTRAMUSCULAR | Status: DC | PRN
  Administered 2015-10-11: 2 mg via INTRAVENOUS

## 2015-10-11 MED ORDER — CEFAZOLIN SODIUM-DEXTROSE 2-3 GM-% IV SOLR
INTRAVENOUS | Status: AC
  Filled 2015-10-11: qty 50

## 2015-10-11 MED ORDER — KETOROLAC TROMETHAMINE 30 MG/ML IJ SOLN
INTRAMUSCULAR | Status: DC | PRN
  Administered 2015-10-11: 30 mg via INTRAVENOUS

## 2015-10-11 MED ORDER — PROMETHAZINE HCL 25 MG/ML IJ SOLN
6.2500 mg | INTRAMUSCULAR | Status: DC | PRN
  Filled 2015-10-11: qty 1

## 2015-10-11 MED ORDER — IOHEXOL 350 MG/ML SOLN
INTRAVENOUS | Status: DC | PRN
  Administered 2015-10-11: 7 mL

## 2015-10-11 SURGICAL SUPPLY — 31 items
ADAPTER CATH URET PLST 4-6FR (CATHETERS) IMPLANT
ADPR CATH URET STRL DISP 4-6FR (CATHETERS)
BAG DRAIN URO-CYSTO SKYTR STRL (DRAIN) ×2 IMPLANT
BAG DRN UROCATH (DRAIN) ×1
BASKET STONE 1.7 NGAGE (UROLOGICAL SUPPLIES) ×2 IMPLANT
CANISTER SUCT LVC 12 LTR MEDI- (MISCELLANEOUS) IMPLANT
CATH INTERMIT  6FR 70CM (CATHETERS) IMPLANT
CLOTH BEACON ORANGE TIMEOUT ST (SAFETY) ×2 IMPLANT
FIBER LASER TRAC TIP (UROLOGICAL SUPPLIES) ×1 IMPLANT
GLOVE BIO SURGEON STRL SZ 6.5 (GLOVE) ×1 IMPLANT
GLOVE BIO SURGEON STRL SZ8 (GLOVE) ×2 IMPLANT
GLOVE BIOGEL PI IND STRL 6.5 (GLOVE) IMPLANT
GLOVE BIOGEL PI INDICATOR 6.5 (GLOVE) ×2
GOWN STRL REUS W/ TWL LRG LVL3 (GOWN DISPOSABLE) ×1 IMPLANT
GOWN STRL REUS W/ TWL XL LVL3 (GOWN DISPOSABLE) ×1 IMPLANT
GOWN STRL REUS W/TWL LRG LVL3 (GOWN DISPOSABLE) ×3 IMPLANT
GOWN STRL REUS W/TWL XL LVL3 (GOWN DISPOSABLE) ×2
GUIDEWIRE 0.038 PTFE COATED (WIRE) IMPLANT
GUIDEWIRE ANG ZIPWIRE 038X150 (WIRE) IMPLANT
GUIDEWIRE STR DUAL SENSOR (WIRE) IMPLANT
IV NS 1000ML (IV SOLUTION) ×2
IV NS 1000ML BAXH (IV SOLUTION) IMPLANT
IV NS IRRIG 3000ML ARTHROMATIC (IV SOLUTION) ×2 IMPLANT
KIT ROOM TURNOVER WOR (KITS) ×2 IMPLANT
LASER FIBER DISP (UROLOGICAL SUPPLIES) IMPLANT
MANIFOLD NEPTUNE II (INSTRUMENTS) ×1 IMPLANT
NS IRRIG 500ML POUR BTL (IV SOLUTION) ×1 IMPLANT
PACK CYSTO (CUSTOM PROCEDURE TRAY) ×2 IMPLANT
SHEATH ACCESS URETERAL 24CM (SHEATH) ×1 IMPLANT
SHEATH ACCESS URETERAL 38CM (SHEATH) ×1 IMPLANT
STENT URET 6FRX24 CONTOUR (STENTS) ×1 IMPLANT

## 2015-10-11 NOTE — Discharge Instructions (Signed)
POSTOPERATIVE CARE AFTER URETEROSCOPY  Stent management  *Stents are often left in after ureteroscopy and stone treatment. If left in, they often cause urinary frequency, urgency, occasional blood in the urine, as well as flank discomfort with urination. These are all expected issues, and should resolve after the stent is removed. *Often times, a small thread is left on the end of the stent, and brought out through the urethra. If so, this is used to remove the stent, making it unnecessary to look in the bladder with a scope in the office to remove the stent. If a thread is left on, did not pull on it until instructed. * Pull string to remove stent on Monday morning. Use caution not pull the string and dislodge the stent before then. It is important 3 to wait until Monday morning to do this. Diet  Once you have adequately recovered from anesthesia, you may gradually advance your diet, as tolerated, to your regular diet.  Activities  You may gradually increase your activities to your normal unrestricted level the day following your procedure.  Medications  You should resume all preoperative medications. If you are on aspirin-like compounds, you should not resume these until the blood clears from your urine. If given an antibiotic by the surgeon, take these until they are completed. You may also be given, if you have a stent, medications to decrease the urinary frequency and urgency.  Pain  After ureteroscopy, there may be some pain on the side of the scope. Take your pain medicine for this. Usually, this pain resolves within a day or 2.  Fever  Please report any fever over 100 to the doctor.  Post Anesthesia Home Care Instructions  Activity: Get plenty of rest for the remainder of the day. A responsible adult should stay with you for 24 hours following the procedure.  For the next 24 hours, DO NOT: -Drive a car -Advertising copywriterperate machinery -Drink alcoholic beverages -Take any medication  unless instructed by your physician -Make any legal decisions or sign important papers.  Meals: Start with liquid foods such as gelatin or soup. Progress to regular foods as tolerated. Avoid greasy, spicy, heavy foods. If nausea and/or vomiting occur, drink only clear liquids until the nausea and/or vomiting subsides. Call your physician if vomiting continues.  Special Instructions/Symptoms: Your throat may feel dry or sore from the anesthesia or the breathing tube placed in your throat during surgery. If this causes discomfort, gargle with warm salt water. The discomfort should disappear within 24 hours.  If you had a scopolamine patch placed behind your ear for the management of post- operative nausea and/or vomiting:  1. The medication in the patch is effective for 72 hours, after which it should be removed.  Wrap patch in a tissue and discard in the trash. Wash hands thoroughly with soap and water. 2. You may remove the patch earlier than 72 hours if you experience unpleasant side effects which may include dry mouth, dizziness or visual disturbances. 3. Avoid touching the patch. Wash your hands with soap and water after contact with the patch.

## 2015-10-11 NOTE — Anesthesia Procedure Notes (Signed)
Procedure Name: LMA Insertion Date/Time: 10/11/2015 1:08 PM Performed by: Renella CunasHAZEL, Urho Rio D Pre-anesthesia Checklist: Patient identified, Emergency Drugs available, Suction available and Patient being monitored Patient Re-evaluated:Patient Re-evaluated prior to inductionOxygen Delivery Method: Circle System Utilized Preoxygenation: Pre-oxygenation with 100% oxygen Intubation Type: IV induction Ventilation: Mask ventilation without difficulty LMA: LMA inserted LMA Size: 4.0 Number of attempts: 1 Airway Equipment and Method: Bite block Placement Confirmation: positive ETCO2 Tube secured with: Tape Dental Injury: Teeth and Oropharynx as per pre-operative assessment

## 2015-10-11 NOTE — Transfer of Care (Signed)
Immediate Anesthesia Transfer of Care Note  Immediate Anesthesia Transfer of Care Note  Patient: Lori Mann  Procedure(s) Performed: Procedure(s) (LRB): CYSTOSCOPY WITH RETROGRADE PYELOGRAM, URETEROSCOPY,STONE EXTRACTIONAND STENT PLACEMENT (Right) HOLMIUM LASER APPLICATION (Right)  Patient Location: PACU  Anesthesia Type: General  Level of Consciousness: awake, oriented, sedated and patient cooperative  Airway & Oxygen Therapy: Patient Spontanous Breathing and Patient connected to face mask oxygen  Post-op Assessment: Report given to PACU RN and Post -op Vital signs reviewed and stable  Post vital signs: Reviewed and stable  Complications: No apparent anesthesia complications

## 2015-10-11 NOTE — Anesthesia Preprocedure Evaluation (Signed)
Anesthesia Evaluation  Patient identified by MRN, date of birth, ID band Patient awake    Reviewed: Allergy & Precautions, NPO status , Patient's Chart, lab work & pertinent test results  Airway Mallampati: II  TM Distance: >3 FB Neck ROM: Full    Dental no notable dental hx.    Pulmonary sleep apnea ,    Pulmonary exam normal breath sounds clear to auscultation       Cardiovascular negative cardio ROS Normal cardiovascular exam Rhythm:Regular Rate:Normal     Neuro/Psych negative neurological ROS  negative psych ROS   GI/Hepatic negative GI ROS, Neg liver ROS,   Endo/Other  negative endocrine ROS  Renal/GU negative Renal ROS     Musculoskeletal negative musculoskeletal ROS (+)   Abdominal (+) + obese,   Peds  Hematology negative hematology ROS (+)   Anesthesia Other Findings   Reproductive/Obstetrics negative OB ROS                             Anesthesia Physical Anesthesia Plan  ASA: II  Anesthesia Plan: General   Post-op Pain Management:    Induction: Intravenous  Airway Management Planned: LMA  Additional Equipment:   Intra-op Plan:   Post-operative Plan: Extubation in OR  Informed Consent: I have reviewed the patients History and Physical, chart, labs and discussed the procedure including the risks, benefits and alternatives for the proposed anesthesia with the patient or authorized representative who has indicated his/her understanding and acceptance.   Dental advisory given  Plan Discussed with: CRNA  Anesthesia Plan Comments:         Anesthesia Quick Evaluation

## 2015-10-11 NOTE — Anesthesia Postprocedure Evaluation (Signed)
  Anesthesia Post-op Note  Patient: Lori Mann  Procedure(s) Performed: Procedure(s) (LRB): CYSTOSCOPY WITH RETROGRADE PYELOGRAM, URETEROSCOPY,STONE EXTRACTIONAND STENT PLACEMENT (Right) HOLMIUM LASER APPLICATION (Right)  Patient Location: PACU  Anesthesia Type: General  Level of Consciousness: awake and alert   Airway and Oxygen Therapy: Patient Spontanous Breathing  Post-op Pain: mild  Post-op Assessment: Post-op Vital signs reviewed, Patient's Cardiovascular Status Stable, Respiratory Function Stable, Patent Airway and No signs of Nausea or vomiting  Last Vitals:  Filed Vitals:   10/11/15 1500  BP: 118/68  Pulse: 82  Temp:   Resp: 11    Post-op Vital Signs: stable   Complications: No apparent anesthesia complications

## 2015-10-12 ENCOUNTER — Encounter (HOSPITAL_BASED_OUTPATIENT_CLINIC_OR_DEPARTMENT_OTHER): Payer: Self-pay | Admitting: Urology

## 2015-10-13 ENCOUNTER — Telehealth: Payer: Self-pay | Admitting: Physician Assistant

## 2015-10-15 ENCOUNTER — Institutional Professional Consult (permissible substitution): Payer: Managed Care, Other (non HMO) | Admitting: Internal Medicine

## 2015-10-17 NOTE — Op Note (Signed)
PATIENT:  Lori Mann  PRE-OPERATIVE DIAGNOSIS: right distal ureteral and right lower pole renal calculi  POST-OPERATIVE DIAGNOSIS: right renal calculus, passed right distal ureteral stone  PROCEDURE: cystoscopy, right retrograde ureteral pyelogram, right ureteroscopy-rigid and flexible, holmium laser and extraction of right ureteral stone, intraoperative fluoroscopy, placement of 24 cm x 6 French contour double-J stent with string  SURGEON:  Bertram MillardStephen M. Jakia Kennebrew, M.D.  ANESTHESIA:  General  EBL:  Minimal  DRAINS: above-noted stent  LOCAL MEDICATIONS USED:  None  SPECIMEN:  Stone fragments, to the patient's family  INDICATION: Lori Mann is a 24 year old female with obstructing stone in the right distal ureter and right lower pole stone.  The patient does not want to undergo medical expulsive therapy, as she is going on a vacation in 2 days.  Because of this, she would like management of her painful right ureteral stone as well as management of her right lower pole stone.  The patient was given an opportunity for medical expulsive therapy.  She does realize risks and complications of ureteroscopy-infection, bleeding, ureteral trauma, anesthetic complications.  She understands these and desires to proceed.  Description of procedure: The patient was properly identified and marked (if applicable) in the holding area. They were then  taken to the operating room and placed on the table in a supine position. General anesthesia was then administered. Once fully anesthetized the patient was moved to the dorsolithotomy position and the genitalia and perineum were sterilely prepped and draped in standard fashion. An official timeout was then performed.  A 23 French panendoscope was advanced into her bladder.  Bladder was inspected circumferentially.  Mild erythema around the right ureteral orifice.  No foreign bodies were seen in the bladder.  There were no bladder lesions.    A 6 French  open-ended catheter was advanced into her right ureter.  No filling defect was seen in the right lower ureter, no hydroureteralnephrosis noted.  There was a filling defect in the lower pole of the right kidney, previously noted to be a stone.  No pyelocaliectasis was present.  A rigid ureteroscope was passed into the right ureter following removal of the cystoscope.  No stone was seen along the length of the entire ureter.  At this point, I realized the stone had passed, and we worked on managing the right lower pole stone.  The ureter was dilated gently first with the inner core of a 12/14 ureteral access sheath, then with the entire 12/14 sheath.  This was passed to the proximal ureter.  The core was removed, and a flexible ureteroscope introduced.  This revealed a slight tear medially on the ureter proximal to the access sheath.  This was easily traversed with the ureteroscope.  The entire pyelocalyceal system was inspected.  There were a few clots in the upper pole calyces.  These were removed.  The lower pole calyx had a moderate size stone which was grasped and brought into the upper pole calyx with the engage basket.  I then fragmented the stone into multiple smaller fragments with a 200  fiber set at 5 J of energy and a rate of 15.  Following fragmentation, the fragments were removed with the engage basket.  All notable fragments were removed.  Careful inspection of the polyp calyceal system at this point revealed no evident stones.  I then removed the ureteroscope.  The guidewire was advanced through the access sheath, the sheath removed, and cystoscopically a 24 cm x 6 [place] contour stent  was left in place with the string attached to the distal end.  The proximal and distal curls were seen fluoroscopically and cystoscopically.  The bladder was drained.  The string was brought out through the urethra following removal of the scope.  It was taped to the patient's right perineum.    PLAN OF CARE:  Discharge to home after PACU  PATIENT DISPOSITION:  PACU - hemodynamically stable.

## 2015-10-25 ENCOUNTER — Encounter: Payer: Self-pay | Admitting: Internal Medicine

## 2015-11-09 ENCOUNTER — Ambulatory Visit (INDEPENDENT_AMBULATORY_CARE_PROVIDER_SITE_OTHER): Payer: Managed Care, Other (non HMO) | Admitting: Adult Health

## 2015-11-09 ENCOUNTER — Encounter: Payer: Self-pay | Admitting: Adult Health

## 2015-11-09 VITALS — BP 118/72 | HR 78 | Temp 98.5°F | Ht 66.0 in | Wt 207.0 lb

## 2015-11-09 DIAGNOSIS — G4733 Obstructive sleep apnea (adult) (pediatric): Secondary | ICD-10-CM

## 2015-11-09 NOTE — Progress Notes (Signed)
Subjective:    Patient ID: Lori Mann, female    DOB: 1990/12/31, 24 y.o.   MRN: [id number]  HPI 24 yo female seen for sleep consult 08/2015 with daytime sleepiness .   TEST  HST 09/24/15 >> AHI 12.4, SaO2 low 90%.  11/09/2015 Follow up : OSA  Patient returns for a two-month follow-up. She was recently seen for sleep consult in September for says of daytime sleepiness. She was set up for home sleep study that was done on October 3 that showed mild sleep apnea with AHI of 12.4/hr . Patient was started on nocturnal C Pap. Patient says she has noticed a slight differences starting with decreased daytime sleepiness. She is having quite a bit of difficulty was with her mask She has a nasal mask. She feels that the pressure is too high and uncomfortable. Would like to start on a new mask.  He denies any chest pain, orthopnea, PND or leg swelling  C Pap download from October 17 through November 15 shows okay. Compliance at  70%, average usage at 4 hours, AutoSet 5-15, AHI 0.3 . , avg press at 7.   Past Medical History  Diagnosis Date  . H/O infectious mononucleosis   . Allergy    Current Outpatient Prescriptions on File Prior to Visit  Medication Sig Dispense Refill  . acetaminophen (TYLENOL) 325 MG tablet Take 650 mg by mouth every 6 (six) hours as needed for pain.    Marland Kitchen. escitalopram (LEXAPRO) 10 MG tablet Take 10 mg by mouth daily.  6  . loratadine (CLARITIN) 10 MG tablet Take 10 mg by mouth daily.    . ORTHO-CYCLEN, 28, 0.25-35 MG-MCG tablet Take 1 tablet by mouth daily.  4  . HYDROmorphone (DILAUDID) 2 MG tablet Take by mouth every 4 (four) hours as needed for severe pain.    Marland Kitchen. oxybutynin (DITROPAN) 5 MG tablet Take 1 tablet (5 mg total) by mouth every 8 (eight) hours as needed for bladder spasms. (Patient not taking: Reported on 11/09/2015) 30 tablet 1  . sulfamethoxazole-trimethoprim (BACTRIM DS,SEPTRA DS) 800-160 MG tablet Take 1 tablet by mouth 2 (two) times daily. (Patient not  taking: Reported on 11/09/2015) 10 tablet 0   No current facility-administered medications on file prior to visit.      Review of Systems Constitutional:   No  weight loss, night sweats,  Fevers, chills,  +fatigue, or  lassitude.  HEENT:   No headaches,  Difficulty swallowing,  Tooth/dental problems, or  Sore throat,                No sneezing, itching, ear ache, nasal congestion, post nasal drip,   CV:  No chest pain,  Orthopnea, PND, swelling in lower extremities, anasarca, dizziness, palpitations, syncope.   GI  No heartburn, indigestion, abdominal pain, nausea, vomiting, diarrhea, change in bowel habits, loss of appetite, bloody stools.   Resp: No shortness of breath with exertion or at rest.  No excess mucus, no productive cough,  No non-productive cough,  No coughing up of blood.  No change in color of mucus.  No wheezing.  No chest wall deformity  Skin: no rash or lesions.  GU: no dysuria, change in color of urine, no urgency or frequency.  No flank pain, no hematuria   MS:  No joint pain or swelling.  No decreased range of motion.  No back pain.  Psych:  No change in mood or affect. No depression or anxiety.  No memory loss.  Objective:   Physical Exam  .GEN: A/Ox3; pleasant , NAD, obese   HEENT:  [place]/AT,  EACs-clear, TMs-wnl, NOSE-clear, THROAT-clear, no lesions, no postnasal drip or exudate noted. Class 2 MP airway   NECK:  Supple w/ fair ROM; no JVD; normal carotid impulses w/o bruits; no thyromegaly or nodules palpated; no lymphadenopathy.  RESP  Clear  P & A; w/o, wheezes/ rales/ or rhonchi.no accessory muscle use, no dullness to percussion  CARD:  RRR, no m/r/g  , no peripheral edema, pulses intact, no cyanosis or clubbing.  GI:   Soft & nt; nml bowel sounds; no organomegaly or masses detected.  Musco: Warm bil, no deformities or joint swelling noted.   Neuro: alert, no focal deficits noted.    Skin: Warm, no lesions or rashes          Assessment & Plan:

## 2015-11-09 NOTE — Patient Instructions (Addendum)
Continue on CPAP At bedtime  .  Will send order to DME for nasal pillows .  Will adjust CPAP pressures -5-10  Work on weight loss.  Do not drive if sleepy.  Download in 1 month .  Follow up Dr. Craige CottaSood  In 2 months and As needed

## 2015-11-09 NOTE — Assessment & Plan Note (Signed)
Mild OSA  Encouraged on CPAP compliance .  Change to nasal pillows and decrease pressure slightly   Plan   Continue on CPAP At bedtime  .  Will send order to DME for nasal pillows .  Will adjust CPAP pressures -5-10  Work on weight loss.  Do not drive if sleepy.  Download in 1 month .  Follow up Dr. Craige CottaSood  In 2 months and As needed

## 2015-11-10 NOTE — Progress Notes (Signed)
Reviewed and agree with assessment/plan. 

## 2015-11-12 ENCOUNTER — Other Ambulatory Visit: Payer: Self-pay | Admitting: Adult Health

## 2015-11-12 DIAGNOSIS — G4733 Obstructive sleep apnea (adult) (pediatric): Secondary | ICD-10-CM

## 2015-11-19 ENCOUNTER — Telehealth: Payer: Self-pay | Admitting: Pulmonary Disease

## 2015-11-19 NOTE — Telephone Encounter (Signed)
LMTCB x 1 

## 2015-11-19 NOTE — Telephone Encounter (Signed)
Auto CPAP 10/09/15 to 10/31/15 >> used on 16 of 23 nights with average 5 hrs and 29 min.  Average AHI is 0.3 with median CPAP 6 cm H2O and 95 th percentile CPAP 7 cm H20.   Will have my nurse inform pt that CPAP reports shows good control of sleep apnea.

## 2015-11-20 NOTE — Telephone Encounter (Signed)
lmtcb X2 for pt.  

## 2015-11-21 NOTE — Telephone Encounter (Signed)
Contacted pt with results per SN Pt expressed understanding, nothing further needed  

## 2016-01-09 ENCOUNTER — Ambulatory Visit: Payer: Managed Care, Other (non HMO) | Admitting: Adult Health

## 2016-01-14 ENCOUNTER — Ambulatory Visit: Payer: Managed Care, Other (non HMO) | Admitting: Adult Health

## 2016-01-17 ENCOUNTER — Ambulatory Visit (INDEPENDENT_AMBULATORY_CARE_PROVIDER_SITE_OTHER): Payer: Managed Care, Other (non HMO) | Admitting: Adult Health

## 2016-01-17 ENCOUNTER — Encounter: Payer: Self-pay | Admitting: Adult Health

## 2016-01-17 VITALS — BP 124/72 | HR 75 | Temp 98.5°F | Ht 65.0 in | Wt 210.0 lb

## 2016-01-17 DIAGNOSIS — G4733 Obstructive sleep apnea (adult) (pediatric): Secondary | ICD-10-CM

## 2016-01-17 NOTE — Progress Notes (Signed)
Reviewed and agree with assessment/plan. 

## 2016-01-17 NOTE — Patient Instructions (Signed)
Continue on CPAP At bedtime  .  Try AYR nasal gel As needed   Work on weight loss.  Do not drive if sleepy.  Follow up Dr. Craige Cotta  In 3 months and As needed

## 2016-01-17 NOTE — Assessment & Plan Note (Signed)
Advised on wt loss

## 2016-01-17 NOTE — Assessment & Plan Note (Signed)
Mild OSA  Improved compliance with CPAP .  Has residual datyime sleepiness , will continue to follow  If persists may want to do a MSLT on return   Plan  Continue on CPAP At bedtime  .  Try AYR nasal gel As needed   Work on weight loss.  Do not drive if sleepy.  Follow up Dr. Craige Cotta  In 3 months and As needed

## 2016-01-17 NOTE — Progress Notes (Signed)
Subjective:    Patient ID: Lori Mann, female    DOB: 1991/03/09, 25 y.o.   MRN: [id number]  HPI 25 yo female seen for sleep consult 08/2015 with daytime sleepiness .   TEST  HST 09/24/15 >> AHI 12.4, SaO2 low 90%.  01/17/2016 Follow up : OSA  Patient returns for a two-month follow-up. She was recently seen for sleep consult in September for says of daytime sleepiness. She was set up for home sleep study that was done on October 3 that showed mild sleep apnea with AHI of 12.4/hr . Patient was started on nocturnal C Pap.  Changed from nasal mask to nasal pillows. Likes it better but still takes off during night.  He denies any chest pain, orthopnea, PND or leg swelling  C Pap download from 12/26-1/24/17  Compliance at  80%, average usage at 5 hours, AutoSet 5-10, AHI 0.2 . , avg press at 6.  Says she still feels tired during daytime and could fall asleep when not active.    Past Medical History  Diagnosis Date  . H/O infectious mononucleosis   . Allergy    Current Outpatient Prescriptions on File Prior to Visit  Medication Sig Dispense Refill  . acetaminophen (TYLENOL) 325 MG tablet Take 650 mg by mouth every 6 (six) hours as needed for pain.    Marland Kitchen escitalopram (LEXAPRO) 10 MG tablet Take 10 mg by mouth daily.  6  . HYDROmorphone (DILAUDID) 2 MG tablet Take by mouth every 4 (four) hours as needed for severe pain.    Marland Kitchen loratadine (CLARITIN) 10 MG tablet Take 10 mg by mouth daily.    . ORTHO-CYCLEN, 28, 0.25-35 MG-MCG tablet Take 1 tablet by mouth daily.  4  . oxybutynin (DITROPAN) 5 MG tablet Take 1 tablet (5 mg total) by mouth every 8 (eight) hours as needed for bladder spasms. 30 tablet 1  . sulfamethoxazole-trimethoprim (BACTRIM DS,SEPTRA DS) 800-160 MG tablet Take 1 tablet by mouth 2 (two) times daily. 10 tablet 0   No current facility-administered medications on file prior to visit.      Review of Systems Constitutional:   No  weight loss, night sweats,  Fevers,  chills,  +fatigue, or  lassitude.  HEENT:   No headaches,  Difficulty swallowing,  Tooth/dental problems, or  Sore throat,                No sneezing, itching, ear ache, nasal congestion, post nasal drip,   CV:  No chest pain,  Orthopnea, PND, swelling in lower extremities, anasarca, dizziness, palpitations, syncope.   GI  No heartburn, indigestion, abdominal pain, nausea, vomiting, diarrhea, change in bowel habits, loss of appetite, bloody stools.   Resp: No shortness of breath with exertion or at rest.  No excess mucus, no productive cough,  No non-productive cough,  No coughing up of blood.  No change in color of mucus.  No wheezing.  No chest wall deformity  Skin: no rash or lesions.  GU: no dysuria, change in color of urine, no urgency or frequency.  No flank pain, no hematuria   MS:  No joint pain or swelling.  No decreased range of motion.  No back pain.  Psych:  No change in mood or affect. No depression or anxiety.  No memory loss.         Objective:   Physical Exam Filed Vitals:   01/17/16 1537  BP: 124/72  Pulse: 75  Temp: 98.5 F (36.9 C)  TempSrc:  Oral  Height:  (1.651 m)  Weight: 210 lb (95.255 kg)  SpO2: 97%   Body mass index is 34.95 kg/(m^2).  .GEN: A/Ox3; pleasant , NAD, obese   HEENT:  [place]/AT,  EACs-clear, TMs-wnl, NOSE-clear, THROAT-clear, no lesions, no postnasal drip or exudate noted. Class 2 MP airway   NECK:  Supple w/ fair ROM; no JVD; normal carotid impulses w/o bruits; no thyromegaly or nodules palpated; no lymphadenopathy.  RESP  Clear  P & A; w/o, wheezes/ rales/ or rhonchi.no accessory muscle use, no dullness to percussion  CARD:  RRR, no m/r/g  , no peripheral edema, pulses intact, no cyanosis or clubbing.  GI:   Soft & nt; nml bowel sounds; no organomegaly or masses detected.  Musco: Warm bil, no deformities or joint swelling noted.   Neuro: alert, no focal deficits noted.    Skin: Warm, no lesions or rashes          Assessment & Plan:

## 2016-02-08 ENCOUNTER — Encounter: Payer: Self-pay | Admitting: Adult Health

## 2016-02-27 ENCOUNTER — Encounter: Payer: Self-pay | Admitting: Family Medicine

## 2016-02-27 ENCOUNTER — Ambulatory Visit (INDEPENDENT_AMBULATORY_CARE_PROVIDER_SITE_OTHER): Payer: Managed Care, Other (non HMO) | Admitting: Family Medicine

## 2016-02-27 VITALS — BP 151/75 | HR 108 | Temp 99.1°F | Resp 20 | Wt 209.5 lb

## 2016-02-27 DIAGNOSIS — R6889 Other general symptoms and signs: Secondary | ICD-10-CM | POA: Insufficient documentation

## 2016-02-27 MED ORDER — OSELTAMIVIR PHOSPHATE 75 MG PO CAPS: 75.0000 mg | ORAL_CAPSULE | Freq: Two times a day (BID) | ORAL | Status: DC

## 2016-02-27 NOTE — Patient Instructions (Addendum)

## 2016-02-27 NOTE — Progress Notes (Signed)
Patient ID: Lori Mann, female   DOB: [birth year], 25 y.o.   MRN: [id number]    Lori Mann , 09-13-1991, 25 y.o., female MRN: [id number]  CC: headache Subjective: Pt presents for an acute OV with complaints of headache, body aches since 3 am this morning. She endorses non-productive cough, nasal congestion,. Pt has tried excedrin to ease their symptoms, without relief. Her BF was ill last week with similar symptoms. No asthma history.  Flu and tdap UTD  No Known Allergies Social History  Substance Use Topics  . Smoking status: Never Smoker   . Smokeless tobacco: Never Used  . Alcohol Use: 0.0 oz/week    0 Standard drinks or equivalent per week   Past Medical History  Diagnosis Date  . H/O infectious mononucleosis   . Allergy   . Kidney stones    Past Surgical History  Procedure Laterality Date  . Tonsillectomy    . Nasal septum surgery    . Cystoscopy with retrograde pyelogram, ureteroscopy and stent placement Right 10/11/2015    Procedure: CYSTOSCOPY WITH RETROGRADE PYELOGRAM, URETEROSCOPY,STONE EXTRACTIONAND STENT PLACEMENT;  Surgeon: Marcine Matar, MD;  Location[facility];  Service: Urology;  Laterality: Right;  . Holmium laser application Right 10/11/2015    Procedure: HOLMIUM LASER APPLICATION;  Surgeon: Marcine Matar, MD;  Location[facility];  Service: Urology;  Laterality: Right;   Family History  Problem Relation Age of Onset  . Cancer Mother     breast ca  . Arthritis Father   . Hyperlipidemia Father      Medication List       This list is accurate as of: 02/27/16  2:46 PM.  Always use your most recent med list.               acetaminophen 325 MG tablet  Commonly known as:  TYLENOL  Take 650 mg by mouth every 6 (six) hours as needed for pain.     escitalopram 10 MG tablet  Commonly known as:  LEXAPRO  Take 10 mg by mouth daily.     loratadine 10 MG tablet  Commonly known as:  CLARITIN  Take 10 mg  by mouth daily.     ORTHO-CYCLEN (28) 0.25-35 MG-MCG tablet  Generic drug:  norgestimate-ethinyl estradiol  Take 1 tablet by mouth daily.         ROS: Negative, with the exception of above mentioned in HPI   Objective:  BP 151/75 mmHg  Pulse 108  Temp(Src) 99.1 F (37.3 C)  Resp 20  Wt 209 lb 8 oz (95.029 kg)  SpO2 96%  LMP 02/17/2016 Body mass index is 34.86 kg/(m^2). Gen: Afebrile. No acute distress. Nontoxic in appearance. Well developed, well nourished female. Lying on exam table, fatigued.  HENT: AT. [place]. Bilateral TM visualized, shiny air fluid levels. MMM, no oral lesions. Bilateral nares with erythema left side only, no bogginess.Throat with moderate  Erythema, no exudates. No cough or hoarseness on exam.  Eyes:Pupils Equal Round Reactive to light, Extraocular movements intact,  Conjunctiva without redness, discharge or icterus. Neck/lymp/endocrine: Supple, no lymphadenopathy CV: Mildly tachycardic. No murmur Chest: CTAB, no wheeze or crackles. Good air movement, normal resp effort.  Abd: Soft.  NTND. BS present Skin: No rashes, purpura or petechiae.  Neuro: Normal gait. PERLA. EOMi. Alert. Oriented x3   Assessment/Plan: Lori Mann is a 25 y.o. female present for acute OV 1. Influenza-like symptoms - rest , hydrate mucinex, supportive therapy. - oseltamivir (TAMIFLU)  75 MG capsule; Take 1 capsule (75 mg total) by mouth 2 (two) times daily.  Dispense: 10 capsule; Refill: 0 - F/U 1 week if worsening or no improvement.     electronically signed by:  Felix Pacinienee Kuneff, DO  [facility] - [place]

## 2016-03-05 ENCOUNTER — Encounter: Payer: Self-pay | Admitting: Internal Medicine

## 2016-03-07 ENCOUNTER — Ambulatory Visit: Payer: Managed Care, Other (non HMO) | Admitting: Gastroenterology

## 2016-04-23 ENCOUNTER — Ambulatory Visit (INDEPENDENT_AMBULATORY_CARE_PROVIDER_SITE_OTHER): Payer: Managed Care, Other (non HMO) | Admitting: Pulmonary Disease

## 2016-04-23 ENCOUNTER — Encounter: Payer: Self-pay | Admitting: Pulmonary Disease

## 2016-04-23 VITALS — BP 102/80 | HR 91 | Ht 66.0 in | Wt 209.0 lb

## 2016-04-23 DIAGNOSIS — G4710 Hypersomnia, unspecified: Secondary | ICD-10-CM | POA: Diagnosis not present

## 2016-04-23 DIAGNOSIS — G4733 Obstructive sleep apnea (adult) (pediatric): Secondary | ICD-10-CM

## 2016-04-23 DIAGNOSIS — Z9989 Dependence on other enabling machines and devices: Secondary | ICD-10-CM

## 2016-04-23 NOTE — Patient Instructions (Addendum)
Will schedule multiple sleep latency test  Can look up following company web sites for CPAP mask options: Resmed, Respironics, Fisher Paykel  Follow up in 2 months with Dr. Craige CottaSood or Nurse practitioner

## 2016-04-23 NOTE — Progress Notes (Signed)
Current Outpatient Prescriptions on File Prior to Visit  Medication Sig  . acetaminophen (TYLENOL) 325 MG tablet Take 650 mg by mouth every 6 (six) hours as needed for pain.  Marland Kitchen. escitalopram (LEXAPRO) 10 MG tablet Take 10 mg by mouth daily.  Marland Kitchen. loratadine (CLARITIN) 10 MG tablet Take 10 mg by mouth daily.  . ORTHO-CYCLEN, 28, 0.25-35 MG-MCG tablet Take 1 tablet by mouth daily.   No current facility-administered medications on file prior to visit.     Chief Complaint  Patient presents with  . Follow-up    Wearing CPAP nightly. Denies problems with the pressure. Pt reports getting acne from where mask sits on face. States that she is not feeling any better since last OV - no improvement in daytime sleepiness. Not feeling rested, "refreshed"   DME: [employer].      Tests HST 09/24/15 >> AHI 12.4, SaO2 low 90%. Auto CPAP 03/23/16 to 04/21/16 >> used on 28 of 30 nights with average 5 hrs 54 minutes.  Average AHI 0.2 with median CPAP 5 and 95 th percentile CPAP 6 cm H2O  Past medical hx Nephrolithiasis  Past surgical hx, Allergies, Family hx, Social hx all reviewed.  Vital Signs BP 102/80 mmHg  Pulse 91  Ht 5\' 6"  (1.676 m)  Wt 209 lb (94.802 kg)  BMI 33.75 kg/m2  SpO2 96%  History of Present Illness Lori Mann is a 25 y.o. female with obstructive sleep apnea.  She uses every night.  She has been getting problems with acne from where her mask sits on her face.  She is using nasal pillow mask, and likes the fit of this better otherwise.  She still has lots of trouble staying awake during the day.  She can fall asleep anywhere if she doesn't keep herself active.  She goes to bed at 11 pm and wakes up at 730 am on weekdays.  She sleeps until noon on weekends, but this doesn't seem to make a difference.  She denies sleep hallucinations, sleep paralysis, or cataplexy.  She does not feel she is having any issues with her mood.  Physical Exam  General - No distress ENT - No sinus  tenderness, no oral exudate, no LAN, MP 4, enlarged tongue Cardiac - s1s2 regular, no murmur Chest - No wheeze/rales/dullness Back - No focal tenderness Abd - Soft, non-tender Ext - No edema Neuro - Normal strength Skin - No rashes Psych - normal mood, and behavior   Assessment/Plan  Persistent daytime hypersomnolence - will arrange for multiple sleep latency test with her using CPAP to further assess - depending on results, will then need to consider adding stimulant medication  Obstructive sleep apnea. - continue auto CPAP    Patient Instructions  Will schedule multiple sleep latency test  Can look up following company web sites for CPAP mask options: Resmed, Respironics, Fisher Paykel  Follow up in 2 months with Dr. Craige CottaSood or Nurse practitioner     Coralyn HellingVineet Satina Jerrell, MD [facility] Pager:  [phone]

## 2016-04-30 ENCOUNTER — Ambulatory Visit (INDEPENDENT_AMBULATORY_CARE_PROVIDER_SITE_OTHER): Payer: Managed Care, Other (non HMO) | Admitting: Internal Medicine

## 2016-04-30 ENCOUNTER — Other Ambulatory Visit (INDEPENDENT_AMBULATORY_CARE_PROVIDER_SITE_OTHER): Payer: Managed Care, Other (non HMO)

## 2016-04-30 ENCOUNTER — Encounter: Payer: Self-pay | Admitting: Internal Medicine

## 2016-04-30 VITALS — BP 106/80 | HR 76 | Ht 66.0 in | Wt 209.0 lb

## 2016-04-30 DIAGNOSIS — R197 Diarrhea, unspecified: Secondary | ICD-10-CM | POA: Diagnosis not present

## 2016-04-30 DIAGNOSIS — K589 Irritable bowel syndrome without diarrhea: Secondary | ICD-10-CM | POA: Diagnosis not present

## 2016-04-30 LAB — IGA: IGA: 172 mg/dL (ref 68–378)

## 2016-04-30 MED ORDER — LOPERAMIDE HCL 2 MG PO TABS: 2.0000 mg | ORAL_TABLET | ORAL | Status: DC | PRN

## 2016-04-30 NOTE — Patient Instructions (Signed)
Your physician has requested that you go to the basement for the following lab work before leaving today:  TTG, IGA  You may take Imodium and Metamucil as discussed with Dr. Marina GoodellPerry  Please follow up on ______________________

## 2016-04-30 NOTE — Progress Notes (Signed)
HISTORY OF PRESENT ILLNESS:  Lori Mann is a 25 y.o. female who is self-referred with a chief complaint of chronic diarrhea. Patient states that she's had problems with diarrhea for "as long as she can remember". She describes postprandial abdominal churning with urgency and explosive diarrhea proximally 5 or 10 minutes after meals. She describes her stools as liquidy or muddy. She denies having solid bowel movements. She states that she can go a day or so without a bowel movement when she is on vacation. No bleeding. No nocturnal symptoms. Over the past two-year she has had 50 pound weight gain. She has tried gluten-free for one month without improvement. Now on gluten. She has been off dairy product for 2 months without help. She also complains of fatigue for which she has been evaluated. Has sleep apnea but CPAP not helping. She does feel that symptoms are also exacerbated by stress. No family history of inflammatory bowel disease. Her sister does have irritable bowel syndrome. She has not had GI workup. She does take Lexapro, birth control, and NSAIDs occasionally. Review of blood work from last year shows unremarkable comprehensive metabolic panel and CBC with differential. No prior GI evaluation or workup. She has not tried any medications over-the-counter or otherwise to address diarrhea. She denies sugar free products use  REVIEW OF SYSTEMS:  All non-GI ROS negative except for chronic anxiety, sinus and allergy, fatigue  Past Medical History  Diagnosis Date  . H/O infectious mononucleosis   . Allergy   . Kidney stones     Past Surgical History  Procedure Laterality Date  . Tonsillectomy    . Nasal septum surgery    . Cystoscopy with retrograde pyelogram, ureteroscopy and stent placement Right 10/11/2015    Procedure: CYSTOSCOPY WITH RETROGRADE PYELOGRAM, URETEROSCOPY,STONE EXTRACTIONAND STENT PLACEMENT;  Surgeon: Marcine MatarStephen Dahlstedt, MD;  [facility];   Service: Urology;  Laterality: Right;  . Holmium laser application Right 10/11/2015    Procedure: HOLMIUM LASER APPLICATION;  Surgeon: Marcine MatarStephen Dahlstedt, MD;  [facility];  Service: Urology;  Laterality: Right;    Social History Lori Mann  reports that she has never smoked. She has never used smokeless tobacco. She reports that she drinks alcohol. She reports that she does not use illicit drugs.  family history includes Arthritis in her father; Cancer in her mother; Hyperlipidemia in her father; Irritable bowel syndrome in her sister.  No Known Allergies     PHYSICAL EXAMINATION: Vital signs: BP 106/80 mmHg  Pulse 76  Ht 5\' 6"  (1.676 m)  Wt 209 lb (94.802 kg)  BMI 33.75 kg/m2  LMP 03/31/2016 (Approximate)  Constitutional: Obese, but generally well-appearing, no acute distress Psychiatric: alert and oriented x3, cooperative Eyes: extraocular movements intact, anicteric, conjunctiva pink Mouth: oral pharynx moist, no lesions Neck: supple without thyromegaly Lymph: no lymphadenopathy Cardiovascular: heart regular rate and rhythm, no murmur Lungs: clear to auscultation bilaterally Abdomen: Obese, soft, nontender, nondistended, no obvious ascites, no peritoneal signs, normal bowel sounds, no organomegaly Rectal: Omitted Extremities: no clubbing cyanosis or lower extremity edema bilaterally Skin: no lesions on visible extremities Neuro: No focal deficits. Normal DTRs. No asterixis.  ASSESSMENT:  #1. Chronic postprandial urgency with diarrhea most consistent with irritable bowel syndrome. #2. Morbid obesity. 50 pound weight gain over the past 2 years  PLAN:  #1. Check celiac panel to rule out sprue #2. Check TSH #3. Recommend over-the-counter Imodium one daily. Titrate to need #4. Recommend Metamucil. Start with 1 tablespoon  daily for 2 weeks then increase to 2 tablespoons daily #5. Detailed discussion on IBS. #6. Literature on IBS provided for  review #7. GI office follow-up 6 weeks. Will consider alternative medications if needed at follow-up such as Librax or Viberzi #8. She will need more structured exercise and weight loss regimen. To be discussed at next follow-up

## 2016-05-01 LAB — TISSUE TRANSGLUTAMINASE, IGA: Tissue Transglutaminase Ab, IgA: 1 U/mL (ref <4)

## 2016-05-07 ENCOUNTER — Ambulatory Visit (HOSPITAL_BASED_OUTPATIENT_CLINIC_OR_DEPARTMENT_OTHER): Payer: Managed Care, Other (non HMO)

## 2016-05-07 DIAGNOSIS — G4733 Obstructive sleep apnea (adult) (pediatric): Secondary | ICD-10-CM

## 2016-05-07 DIAGNOSIS — G4710 Hypersomnia, unspecified: Secondary | ICD-10-CM

## 2016-05-12 ENCOUNTER — Ambulatory Visit (INDEPENDENT_AMBULATORY_CARE_PROVIDER_SITE_OTHER): Payer: Managed Care, Other (non HMO) | Admitting: Internal Medicine

## 2016-05-12 ENCOUNTER — Encounter: Payer: Self-pay | Admitting: Internal Medicine

## 2016-05-12 VITALS — BP 104/68 | HR 90 | Temp 98.4°F | Ht 66.0 in | Wt 204.0 lb

## 2016-05-12 DIAGNOSIS — G4733 Obstructive sleep apnea (adult) (pediatric): Secondary | ICD-10-CM

## 2016-05-12 NOTE — Progress Notes (Signed)
Subjective:    Patient ID: Lori Mann, female    DOB: 1991-01-16, 25 y.o.   MRN: [id number]  HPI 25 year old patient who is followed by pulmonary medicine with OSA.  She was seen at an urgent care.  7 days ago and presented with cough.  Temperature of 102 and was diagnosed with community acquired pneumonia.  She is completing antibiotic therapy with erythromycin.  She still has a cough but generally feels much better.  She did return to work earlier today.  Cough and fatigue are her only complaints.  No recurrent fever  Past Medical History  Diagnosis Date  . H/O infectious mononucleosis   . Allergy   . Kidney stones      Social History   Social History  . Marital Status: Single    Spouse Name: N/A  . Number of Children: N/A  . Years of Education: N/A   Occupational History  . Not on file.   Social History Main Topics  . Smoking status: Never Smoker   . Smokeless tobacco: Never Used  . Alcohol Use: 0.0 oz/week    0 Standard drinks or equivalent per week  . Drug Use: No  . Sexual Activity: Yes    Birth Control/ Protection: Pill   Other Topics Concern  . Not on file   Social History Narrative    Past Surgical History  Procedure Laterality Date  . Tonsillectomy    . Nasal septum surgery    . Cystoscopy with retrograde pyelogram, ureteroscopy and stent placement Right 10/11/2015    Procedure: CYSTOSCOPY WITH RETROGRADE PYELOGRAM, URETEROSCOPY,STONE EXTRACTIONAND STENT PLACEMENT;  Surgeon: Marcine Matar, MD;  Location: [facility];  Service: Urology;  Laterality: Right;  . Holmium laser application Right 10/11/2015    Procedure: HOLMIUM LASER APPLICATION;  Surgeon: Marcine Matar, MD;  Location: [facility];  Service: Urology;  Laterality: Right;    Family History  Problem Relation Age of Onset  . Cancer Mother     breast ca  . Arthritis Father   . Hyperlipidemia Father   . Irritable bowel syndrome Sister     No Known  Allergies  Current Outpatient Prescriptions on File Prior to Visit  Medication Sig Dispense Refill  . acetaminophen (TYLENOL) 325 MG tablet Take 650 mg by mouth every 6 (six) hours as needed for pain.    . drospirenone-ethinyl estradiol (YAZ,GIANVI,LORYNA) 3-0.02 MG tablet Take 1 tablet by mouth daily.    Marland Kitchen escitalopram (LEXAPRO) 10 MG tablet Take 10 mg by mouth daily.  6  . loperamide (IMODIUM A-D) 2 MG tablet Take 1 tablet (2 mg total) by mouth as needed for diarrhea or loose stools. 30 tablet 0  . loratadine (CLARITIN) 10 MG tablet Take 10 mg by mouth daily.    . meloxicam (MOBIC) 15 MG tablet Take 1 tablet by mouth daily.  1   No current facility-administered medications on file prior to visit.    BP 104/68 mmHg  Pulse 90  Temp(Src) 98.4 F (36.9 C) (Oral)  Ht  (1.676 m)  Wt 204 lb (92.534 kg)  BMI 32.94 kg/m2  SpO2 96%  LMP 03/31/2016 (Approximate)      Review of Systems  Constitutional: Positive for fatigue.  HENT: Negative for congestion, dental problem, hearing loss, rhinorrhea, sinus pressure, sore throat and tinnitus.   Eyes: Negative for pain, discharge and visual disturbance.  Respiratory: Positive for cough. Negative for shortness of breath.   Cardiovascular: Negative for chest pain, palpitations  and leg swelling.  Gastrointestinal: Negative for nausea, vomiting, abdominal pain, diarrhea, constipation, blood in stool and abdominal distention.  Genitourinary: Negative for dysuria, urgency, frequency, hematuria, flank pain, vaginal bleeding, vaginal discharge, difficulty urinating, vaginal pain and pelvic pain.  Musculoskeletal: Negative for joint swelling, arthralgias and gait problem.  Skin: Negative for rash.  Neurological: Negative for dizziness, syncope, speech difficulty, weakness, numbness and headaches.  Hematological: Negative for adenopathy.  Psychiatric/Behavioral: Negative for behavioral problems, dysphoric mood and agitation. The patient is not  nervous/anxious.        Objective:   Physical Exam  Constitutional: She is oriented to person, place, and time. She appears well-developed and well-nourished.  HENT:  Head: Normocephalic.  Right Ear: External ear normal.  Left Ear: External ear normal.  Mouth/Throat: Oropharynx is clear and moist.  Eyes: Conjunctivae and EOM are normal. Pupils are equal, round, and reactive to light.  Neck: Normal range of motion. Neck supple. No thyromegaly present.  Cardiovascular: Normal rate, regular rhythm, normal heart sounds and intact distal pulses.   Pulmonary/Chest: Effort normal and breath sounds normal. No respiratory distress. She has no wheezes. She has no rales.  Abdominal: Soft. Bowel sounds are normal. She exhibits no mass. There is no tenderness.  Musculoskeletal: Normal range of motion.  Lymphadenopathy:    She has no cervical adenopathy.  Neurological: She is alert and oriented to person, place, and time.  Skin: Skin is warm and dry. No rash noted.  Psychiatric: She has a normal mood and affect. Her behavior is normal.          Assessment & Plan:   History of community acquired pneumonia.  Patient will complete antibiotic therapy History of OSA.  Pulmonary follow-up Obesity.  Weight loss encouraged  Patient will report any new or worsening symptoms  Rogelia BogaKWIATKOWSKI,Niccole Witthuhn FRANK, MD

## 2016-05-12 NOTE — Patient Instructions (Signed)
Call or return to clinic prn if these symptoms worsen or fail to improve as anticipated.  Take over-the-counter expectorants and cough medications such as  Mucinex DM.  Call if there is no improvement in 5 to 7 days or if  you develop worsening cough, fever, or new symptoms, such as shortness of breath or chest pain. 

## 2016-05-16 ENCOUNTER — Encounter: Payer: Self-pay | Admitting: Internal Medicine

## 2016-05-20 ENCOUNTER — Encounter (HOSPITAL_BASED_OUTPATIENT_CLINIC_OR_DEPARTMENT_OTHER): Payer: Managed Care, Other (non HMO)

## 2016-05-20 DIAGNOSIS — G4733 Obstructive sleep apnea (adult) (pediatric): Secondary | ICD-10-CM

## 2016-05-21 ENCOUNTER — Encounter (HOSPITAL_BASED_OUTPATIENT_CLINIC_OR_DEPARTMENT_OTHER): Payer: Managed Care, Other (non HMO)

## 2016-05-26 ENCOUNTER — Ambulatory Visit (HOSPITAL_BASED_OUTPATIENT_CLINIC_OR_DEPARTMENT_OTHER): Payer: Managed Care, Other (non HMO) | Attending: Pulmonary Disease | Admitting: Pulmonary Disease

## 2016-05-26 VITALS — Ht 66.0 in | Wt 210.0 lb

## 2016-05-26 DIAGNOSIS — G4710 Hypersomnia, unspecified: Secondary | ICD-10-CM | POA: Diagnosis not present

## 2016-05-26 DIAGNOSIS — I493 Ventricular premature depolarization: Secondary | ICD-10-CM | POA: Diagnosis not present

## 2016-05-26 DIAGNOSIS — Z9989 Dependence on other enabling machines and devices: Secondary | ICD-10-CM

## 2016-05-26 DIAGNOSIS — G4733 Obstructive sleep apnea (adult) (pediatric): Secondary | ICD-10-CM | POA: Insufficient documentation

## 2016-05-26 DIAGNOSIS — R0683 Snoring: Secondary | ICD-10-CM | POA: Insufficient documentation

## 2016-05-27 ENCOUNTER — Ambulatory Visit (HOSPITAL_BASED_OUTPATIENT_CLINIC_OR_DEPARTMENT_OTHER): Payer: Managed Care, Other (non HMO) | Attending: Pulmonary Disease | Admitting: Pulmonary Disease

## 2016-05-27 VITALS — Ht 66.0 in | Wt 210.0 lb

## 2016-05-27 DIAGNOSIS — G4730 Sleep apnea, unspecified: Secondary | ICD-10-CM | POA: Diagnosis not present

## 2016-05-27 DIAGNOSIS — G4710 Hypersomnia, unspecified: Secondary | ICD-10-CM | POA: Insufficient documentation

## 2016-05-27 DIAGNOSIS — G4733 Obstructive sleep apnea (adult) (pediatric): Secondary | ICD-10-CM | POA: Diagnosis not present

## 2016-05-27 DIAGNOSIS — G4719 Other hypersomnia: Secondary | ICD-10-CM | POA: Diagnosis not present

## 2016-05-27 DIAGNOSIS — Z79899 Other long term (current) drug therapy: Secondary | ICD-10-CM | POA: Insufficient documentation

## 2016-05-27 DIAGNOSIS — Z9989 Dependence on other enabling machines and devices: Secondary | ICD-10-CM

## 2016-05-27 NOTE — Procedures (Signed)
Patient Name: Lori Mann, [name] Study Date: 05/26/2016 Gender: Female D.O.B: 05/13/1991 Age (years): 25 Referring Provider: Coralyn HellingVineet Atalya Dano MD, ABSM Height (inches): 66 Interpreting Physician: Coralyn HellingVineet Delmer Kowalski MD, ABSM Weight (lbs): 210 RPSGT: Armen PickupFord, Evelyn BMI: 34 MRN: [id number] Neck Size: 15.00  CLINICAL INFORMATION Sleep Study Type: NPSG   Indication for sleep study: 25 yo female with history of mild obstructive sleep apnea based on home sleep study from 09/24/15 (AHI 12.4, SaO2 low 90%).  She has been on CPAP therapy, but has persistent daytime hypersomnolence.   Epworth Sleepiness Score: 18   SLEEP STUDY TECHNIQUE As per the AASM Manual for the Scoring of Sleep and Associated Events v2.3 (April 2016) with a hypopnea requiring 4% desaturations. The channels recorded and monitored were frontal, central and occipital EEG, electrooculogram (EOG), submentalis EMG (chin), nasal and oral airflow, thoracic and abdominal wall motion, anterior tibialis EMG, snore microphone, electrocardiogram, and pulse oximetry.  MEDICATIONS Patient's medications include: reviewed in electronic medical record. Medications self-administered by patient during sleep study : No sleep medicine administered.  SLEEP ARCHITECTURE The study was initiated at 11:03:17 PM and ended at 6:00:08 AM. Sleep onset time was 9.1 minutes and the sleep efficiency was 95.5%. The total sleep time was 398.0 minutes. Stage REM latency was 102.0 minutes. The patient spent 0.63% of the night in stage N1 sleep, 40.20% in stage N2 sleep, 27.01% in stage N3 and 32.16% in REM. Alpha intrusion was absent. Supine sleep was 100.00%.  RESPIRATORY PARAMETERS The overall apnea/hypopnea index (AHI) was 1.4 per hour. There were 9 total apneas, including 0 obstructive, 9 central and 0 mixed apneas. There were 0 hypopneas and 17 RERAs. The AHI during Stage REM sleep was 4.2 per hour. AHI while supine was 1.4 per hour. The mean oxygen saturation was  96.29%. The minimum SpO2 during sleep was 92.00%. Moderate snoring was noted during this study.  CARDIAC DATA The 2 lead EKG demonstrated sinus rhythm. The mean heart rate was 81.27 beats per minute. Other EKG findings include: PVCs.  LEG MOVEMENT DATA The total PLMS were 0 with a resulting PLMS index of 0.00. Associated arousal with leg movement index was 0.0 .  IMPRESSIONS This study did not show significant obstructive sleep apnea.  Her AHI was 1.4 and SpO2 low was 92%.  She had sleep latency of 9.1 minutes.  She had 398 minutes of sleep time (95.5% sleep efficiency).  DIAGNOSIS - Hypersomnia (G47.10)  RECOMMENDATIONS - Avoid alcohol, sedatives and other CNS depressants that may worsen sleep apnea and disrupt normal sleep architecture. - Sleep hygiene should be reviewed to assess factors that may improve sleep quality. - Weight management and regular exercise should be initiated or continued if appropriate. - Patient to have multiple sleep latency test to further assess degree of hypersomnolence.   Coralyn HellingVineet Symphanie Cederberg, MD, ABSM Diplomate, American Board of Sleep Medicine 05/27/2016, 11:56 AM  NPI: [id number]

## 2016-05-28 ENCOUNTER — Telehealth: Payer: Self-pay | Admitting: Pulmonary Disease

## 2016-05-28 NOTE — Telephone Encounter (Signed)
PSG 05/26/16 >> AHI 1.4, SpO2 low 92%. MSLT 05/27/16 >> mean sleep latency 2:24 min, 5 of 5 naps with sleep, 1 of 5 SOREMs  Will have my nurse schedule appt with me to review test results >> okay to double book visit if needed.

## 2016-05-28 NOTE — Procedures (Signed)
Patient Name: Lori Mann, Lori Mann Date: 05/27/2016 Gender: Female D.O.B: 1991/04/20 Age (years): 25 Referring Provider: Coralyn HellingVineet Kanita Delage MD, ABSM Height (inches): 66 Interpreting Physician: Coralyn HellingVineet Norelle Runnion MD, ABSM Weight (lbs): 210 RPSGT: [place], [place] BMI: 34 MRN: [id number] Neck Size: 15.00  CLINICAL INFORMATION Sleep Mann Type: MSLT   The patient was referred to the sleep center for evaluation of daytime sleepiness.  She had overnight polysomnogram from 05/26/16 which showed 398 minutes of sleep time   Epworth Sleepiness Score: 18  SLEEP Mann TECHNIQUE A Multiple Sleep Latency Test was performed after an overnight polysomnogram according to the AASM scoring manual v2.3 (April 2016) and clinical guidelines. Five nap opportunities occurred over the course of the test which followed an overnight polysomnogram. The channels recorded and monitored were frontal, central, and occipital electroencephalography (EEG), right and left electrooculogram (EOG), chin electromyography (EMG), and electrocardiogram (EKG).  MEDICATIONS Medications taken by the patient : advair, yaz, claritin, proair. Medications administered by patient during sleep Mann : No sleep medicine administered.  IMPRESSIONS - Total number of naps attempted: 5.00 . - Total number of naps with sleep attained: 5 out of 5. - The Mean Sleep Latency was 02:24 minutes. - There were sleep-onset REM periods: 1 out of 5 nap sessions.  The patient appears to have pathologic sleepiness, evidenced by a short mean sleep latency (8 minutes or less) on this MSLT.  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   Coralyn HellingVineet Amra Shukla, MD, ABSM Diplomate, American Board of Sleep Medicine 05/28/2016, 12:21 PM  NPI: [id number]

## 2016-06-03 NOTE — Telephone Encounter (Signed)
LM x 1 for pt NEEDS OV - can be added to 06/04/16 @ 415 or 430

## 2016-06-04 ENCOUNTER — Encounter: Payer: Self-pay | Admitting: Pulmonary Disease

## 2016-06-04 ENCOUNTER — Ambulatory Visit (INDEPENDENT_AMBULATORY_CARE_PROVIDER_SITE_OTHER): Payer: Managed Care, Other (non HMO) | Admitting: Pulmonary Disease

## 2016-06-04 VITALS — BP 118/80 | HR 102 | Ht 66.0 in | Wt 212.6 lb

## 2016-06-04 DIAGNOSIS — Z9989 Dependence on other enabling machines and devices: Secondary | ICD-10-CM

## 2016-06-04 DIAGNOSIS — G47419 Narcolepsy without cataplexy: Secondary | ICD-10-CM | POA: Diagnosis not present

## 2016-06-04 DIAGNOSIS — G4733 Obstructive sleep apnea (adult) (pediatric): Secondary | ICD-10-CM | POA: Diagnosis not present

## 2016-06-04 MED ORDER — ARMODAFINIL 150 MG PO TABS: 150.0000 mg | ORAL_TABLET | Freq: Every day | ORAL | Status: DC

## 2016-06-04 NOTE — Telephone Encounter (Signed)
Patient returning call about test results. [phone]

## 2016-06-04 NOTE — Telephone Encounter (Signed)
Pt scheduled for OV with VS at 415 today. Nothing further needed.

## 2016-06-04 NOTE — Progress Notes (Signed)
Current Outpatient Prescriptions on File Prior to Visit  Medication Sig  . acetaminophen (TYLENOL) 325 MG tablet Take 650 mg by mouth every 6 (six) hours as needed for pain.  Marland Kitchen dextromethorphan (DELSYM) 30 MG/5ML liquid Take by mouth as needed for cough.  . drospirenone-ethinyl estradiol (YAZ,GIANVI,LORYNA) 3-0.02 MG tablet Take 1 tablet by mouth daily.  Marland Kitchen escitalopram (LEXAPRO) 10 MG tablet Take 10 mg by mouth daily.  Marland Kitchen loperamide (IMODIUM A-D) 2 MG tablet Take 1 tablet (2 mg total) by mouth as needed for diarrhea or loose stools.  Marland Kitchen loratadine (CLARITIN) 10 MG tablet Take 10 mg by mouth daily.  . meloxicam (MOBIC) 15 MG tablet Take 1 tablet by mouth daily.   No current facility-administered medications on file prior to visit.     Chief Complaint  Patient presents with  . Follow-up    Review sleep study     Tests HST 09/24/15 >> AHI 12.4, SaO2 low 90%. Auto CPAP 03/23/16 to 04/21/16 >> used on 28 of 30 nights with average 5 hrs 54 minutes.  Average AHI 0.2 with median CPAP 5 and 95 th percentile CPAP 6 cm H2O PSG 05/26/16 >> AHI 1.4, SpO2 low 92%. MSLT 05/27/16 >> mean sleep latency 2:24 min, 5 of 5 naps with sleep, 1 of 5 SOREMs  Past medical hx Nephrolithiasis  Past surgical hx, Allergies, Family hx, Social hx all reviewed.  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  She is here to review her MSLT.  She had 5 of 5 naps with sleep and 1 SOREM >> of note is that she was still taking lexapro.  She does use CPAP and feels this helps, but she still has trouble staying awake during the day.  Physical Exam  General - No distress ENT - No sinus tenderness, no oral exudate, no LAN, MP 4, enlarged tongue Cardiac - s1s2 regular, no murmur Chest - No wheeze/rales/dullness Back - No  focal tenderness Abd - Soft, non-tender Ext - No edema Neuro - Normal strength Skin - No rashes Psych - normal mood, and behavior   Assessment/Plan  Narcolepsy w/o cataplexy. - will start nuvigil >> advised her to d/w her gynecologist about alternative birth control methods - discussed regular sleep pattern and allowing for scheduled naps during the day - will need to monitor for whether she has cataplexy emerge as she is taken off SSRI - discussed HLA test >> wouldn't change current management, so defer for now  Obstructive sleep apnea. - continue auto CPAP    Patient Instructions  Nuvigil 150 mg daily in the morning  Discuss with your gynecologist about options for birth control while on nuvigil  Follow up in 4 weeks with Dr. Halford Chessman or Nurse Practitioner     Chesley Mires, MD [place] Pager:  [phone]

## 2016-06-04 NOTE — Patient Instructions (Signed)
Nuvigil 150 mg daily in the morning  Discuss with your gynecologist about options for birth control while on nuvigil  Follow up in 4 weeks with Dr. Craige CottaSood or Nurse Practitioner

## 2016-06-05 NOTE — Procedures (Unsigned)
Note create by Epic in error.

## 2016-06-05 NOTE — Procedures (Unsigned)
Visit cancelled.

## 2016-06-10 ENCOUNTER — Telehealth: Payer: Self-pay | Admitting: Pulmonary Disease

## 2016-06-10 NOTE — Telephone Encounter (Signed)
Pt states that she has not been able to sleep since Friday night.  Pt started taking Nuvigil 150mg  on 06/04/16 Pt states that she is having severe restless legs and is unable to go to sleep. Pt is going on 4 days with limited sleep.  Is there a period of time that her body will have to get used to the medication or is she taking it at the wrong time. Pt is taking Nuvigil at 9am every morning.  Pt states that she did not take the medication this morning because she is so exhausted and wants to sleep tonight. Pt states that she was going to get some OTC sleep aids to help her sleep tonight if still having trouble. Please advise TP of any rec's as Dr Craige CottaSood is 11pm E-link today and is not available today. Thanks.

## 2016-06-10 NOTE — Telephone Encounter (Signed)
Go ahead and hold Nuvigil today as she has planned  May try melatonin tonight for sleep aid .  Do not forget to take naps during day if possible this may help with sleepiness.  Let us know if stopping Nuvigil helps we can discuss with Dr. Craige CottaSood  If he recommends  Alternative.  Please contact office for sooner follow up if symptoms do not improve or worsen or seek emergency care

## 2016-06-10 NOTE — Telephone Encounter (Signed)
Patient notified of Tammy Parrett's recommendations. Nothing further needed.

## 2016-06-10 NOTE — Telephone Encounter (Signed)
Patient returned call, CB is [phone]

## 2016-06-10 NOTE — Telephone Encounter (Signed)
LMTCB

## 2016-06-12 ENCOUNTER — Ambulatory Visit: Payer: Managed Care, Other (non HMO) | Admitting: Internal Medicine

## 2016-07-02 ENCOUNTER — Ambulatory Visit (INDEPENDENT_AMBULATORY_CARE_PROVIDER_SITE_OTHER): Payer: Managed Care, Other (non HMO) | Admitting: Adult Health

## 2016-07-02 ENCOUNTER — Encounter: Payer: Self-pay | Admitting: Adult Health

## 2016-07-02 VITALS — BP 120/78 | HR 82 | Temp 98.1°F | Ht 66.0 in | Wt 210.0 lb

## 2016-07-02 DIAGNOSIS — G47419 Narcolepsy without cataplexy: Secondary | ICD-10-CM | POA: Insufficient documentation

## 2016-07-02 MED ORDER — ARMODAFINIL 50 MG PO TABS
100.0000 mg | ORAL_TABLET | Freq: Every day | ORAL | Status: DC
Start: 2016-07-02 — End: 2016-07-02

## 2016-07-02 MED ORDER — ARMODAFINIL 50 MG PO TABS: 50.0000 mg | ORAL_TABLET | Freq: Every day | ORAL | Status: DC

## 2016-07-02 NOTE — Progress Notes (Signed)
Reviewed and agree with assessment/plan.  Alfonzia Woolum, MD [facility] 07/02/2016, 4:22 PM Pager:  [phone]  

## 2016-07-02 NOTE — Addendum Note (Signed)
Addended by: Karalee HeightOX, Miley Lindon P on: 07/02/2016 12:35 PM   Modules accepted: Orders

## 2016-07-02 NOTE — Progress Notes (Signed)
Subjective:    Patient ID: Lori Mann, female    DOB: February 05, 1991, 25 y.o.   MRN: [id number]  HPI 25 yo female seen for sleep consult 08/2015 with daytime sleepiness .   TEST  HST 09/24/15 >> AHI 12.4, SaO2 low 90%. Auto CPAP 03/23/16 to 04/21/16 >> used on 28 of 30 nights with average 5 hrs 54 minutes. Average AHI 0.2 with median CPAP 5 and 95 th percentile CPAP 6 cm H2O PSG 05/26/16 >> AHI 1.4, SpO2 low 92%. MSLT 05/27/16 >> mean sleep latency 2:24 min, 5 of 5 naps with sleep, 1 of 5 SOREMs  07/02/2016 Follow up : OSA and Nacrolepsy  Patient returns for a 3 weeks follow up  Recently seen for follow up after MSLT found to have nacrolepsy.  She was started on Nuvigil 150mg  .  She could not tolerate this dose due to inability to sleep at night.  SHe decreased dose to 75mg  which is better but still can not sleep.  Does feel it helps with daytime symptom of sleepiness.  Unable to nap due to job requirements.  No longer on CPAP as most recent sleep study did not OSA.    Past Medical History  Diagnosis Date  . H/O infectious mononucleosis   . Allergy   . Kidney stones   . Narcolepsy without cataplexy   . IBS (irritable bowel syndrome)    Current Outpatient Prescriptions on File Prior to Visit  Medication Sig Dispense Refill  . acetaminophen (TYLENOL) 325 MG tablet Take 650 mg by mouth every 6 (six) hours as needed for pain.    . drospirenone-ethinyl estradiol (YAZ,GIANVI,LORYNA) 3-0.02 MG tablet Take 1 tablet by mouth daily.    Marland Kitchen. loperamide (IMODIUM A-D) 2 MG tablet Take 1 tablet (2 mg total) by mouth as needed for diarrhea or loose stools. 30 tablet 0  . loratadine (CLARITIN) 10 MG tablet Take 10 mg by mouth daily.    . Armodafinil (NUVIGIL) 150 MG tablet Take 1 tablet (150 mg total) by mouth daily. (Patient not taking: Reported on 07/02/2016) 30 tablet 4  . dextromethorphan (DELSYM) 30 MG/5ML liquid Take by mouth as needed for cough. Reported on 07/02/2016    . escitalopram  (LEXAPRO) 10 MG tablet Take 10 mg by mouth daily. Reported on 07/02/2016  6   No current facility-administered medications on file prior to visit.      Review of Systems Constitutional:   No  weight loss, night sweats,  Fevers, chills,  +fatigue, or  lassitude.  HEENT:   No headaches,  Difficulty swallowing,  Tooth/dental problems, or  Sore throat,                No sneezing, itching, ear ache, nasal congestion, post nasal drip,   CV:  No chest pain,  Orthopnea, PND, swelling in lower extremities, anasarca, dizziness, palpitations, syncope.   GI  No heartburn, indigestion, abdominal pain, nausea, vomiting, diarrhea, change in bowel habits, loss of appetite, bloody stools.   Resp: No shortness of breath with exertion or at rest.  No excess mucus, no productive cough,  No non-productive cough,  No coughing up of blood.  No change in color of mucus.  No wheezing.  No chest wall deformity  Skin: no rash or lesions.  GU: no dysuria, change in color of urine, no urgency or frequency.  No flank pain, no hematuria   MS:  No joint pain or swelling.  No decreased range of motion.  No back  pain.  Psych:  No change in mood or affect. No depression or anxiety.  No memory loss.         Objective:   Physical Exam  Filed Vitals:   07/02/16 1208  BP: 120/78  Pulse: 82  Temp: 98.1 F (36.7 C)  TempSrc: Oral  Height:  (1.676 m)  Weight: 210 lb (95.255 kg)  SpO2: 98%    .GEN: A/Ox3; pleasant , NAD, obese   HEENT:  [place]/AT,  EACs-clear, TMs-wnl, NOSE-clear, THROAT-clear, no lesions, no postnasal drip or exudate noted. Class 2 MP airway   NECK:  Supple w/ fair ROM; no JVD; normal carotid impulses w/o bruits; no thyromegaly or nodules palpated; no lymphadenopathy.  RESP  Clear  P & A; w/o, wheezes/ rales/ or rhonchi.no accessory muscle use, no dullness to percussion  CARD:  RRR, no m/r/g  , no peripheral edema, pulses intact, no cyanosis or clubbing.  GI:   Soft & nt; nml bowel  sounds; no organomegaly or masses detected.  Musco: Warm bil, no deformities or joint swelling noted.   Neuro: alert, no focal deficits noted.    Skin: Warm, no lesions or rashes  Tammy Parrett NP-C  [facility]  07/02/2016

## 2016-07-02 NOTE — Patient Instructions (Signed)
Decrease Nuvigil to 50mg  daily, take in am.  Try to take frequent naps if possible Follow up with Dr. Craige CottaSood in 4 months

## 2016-07-02 NOTE — Assessment & Plan Note (Signed)
Improved on Nuvigil but needs lower dose.   Plan  Decrease Nuvigil to 50mg  daily, take in am.  Try to take frequent naps if possible Follow up with Dr. Craige CottaSood in 4 months

## 2016-09-05 ENCOUNTER — Telehealth: Payer: Self-pay | Admitting: Pulmonary Disease

## 2016-09-05 NOTE — Telephone Encounter (Signed)
Spoke w/ pt. She is scheduled to come in 9/25 at 9:15 for appt. Nothing further needed

## 2016-09-05 NOTE — Telephone Encounter (Signed)
Please schedule her for ROV with me next week >> okay to double book visit.

## 2016-09-05 NOTE — Telephone Encounter (Signed)
Pt called in. She reports she does not feel like the nuvigil is working for her. She is wanting to know if this can be changed to something else possibly? Please advise Dr. Craige CottaSood thanks

## 2016-09-15 ENCOUNTER — Ambulatory Visit (INDEPENDENT_AMBULATORY_CARE_PROVIDER_SITE_OTHER): Payer: Managed Care, Other (non HMO) | Admitting: Pulmonary Disease

## 2016-09-15 ENCOUNTER — Encounter: Payer: Self-pay | Admitting: Pulmonary Disease

## 2016-09-15 VITALS — BP 108/80 | HR 87 | Ht 65.0 in | Wt 206.0 lb

## 2016-09-15 DIAGNOSIS — G4733 Obstructive sleep apnea (adult) (pediatric): Secondary | ICD-10-CM | POA: Diagnosis not present

## 2016-09-15 DIAGNOSIS — G4710 Hypersomnia, unspecified: Secondary | ICD-10-CM

## 2016-09-15 DIAGNOSIS — G47419 Narcolepsy without cataplexy: Secondary | ICD-10-CM | POA: Diagnosis not present

## 2016-09-15 DIAGNOSIS — Z9989 Dependence on other enabling machines and devices: Secondary | ICD-10-CM

## 2016-09-15 MED ORDER — METHYLPHENIDATE HCL 5 MG PO TABS: 5.0000 mg | ORAL_TABLET | Freq: Two times a day (BID) | ORAL | 0 refills | Status: DC

## 2016-09-15 NOTE — Progress Notes (Signed)
Current Outpatient Prescriptions on File Prior to Visit  Medication Sig  . acetaminophen (TYLENOL) 325 MG tablet Take 650 mg by mouth every 6 (six) hours as needed for pain.  Marland Kitchen. dextromethorphan (DELSYM) 30 MG/5ML liquid Take by mouth as needed for cough. Reported on 07/02/2016  . drospirenone-ethinyl estradiol (YAZ,GIANVI,LORYNA) 3-0.02 MG tablet Take 1 tablet by mouth daily.  Marland Kitchen. loperamide (IMODIUM A-D) 2 MG tablet Take 1 tablet (2 mg total) by mouth as needed for diarrhea or loose stools.  Marland Kitchen. loratadine (CLARITIN) 10 MG tablet Take 10 mg by mouth daily.   No current facility-administered medications on file prior to visit.      Chief Complaint  Patient presents with  . Follow-up    Discuss changing Nuvigil 50mg  - 50mg  dose is not doing anything for the patient. States that all higher doses kept her awake for 24-72hrs. Pt states that she is excessively tired. Needs something that will not interact with birth control      Sleep tests HST 09/24/15 >> AHI 12.4, SaO2 low 90%. Auto CPAP 03/23/16 to 04/21/16 >> used on 28 of 30 nights with average 5 hrs 54 minutes.  Average AHI 0.2 with median CPAP 5 and 95 th percentile CPAP 6 cm H2O PSG 05/26/16 >> AHI 1.4, SpO2 low 92%. MSLT 05/27/16 >> mean sleep latency 2:24 min, 5 of 5 naps with sleep, 1 of 5 SOREMs  Past medical history Nephrolithiasis  Past surgical history, Family history, Social history, Allergies reviewed.  Vital Signs BP 108/80 (BP Location: Right Arm, Cuff Size: Normal)   Pulse 87   Ht 5\' 5"  (1.651 m)   Wt 206 lb (93.4 kg)   SpO2 95%   BMI 34.28 kg/m   History of Present Illness Lori Mann is a 25 y.o. female with narcolepsy w/o cataplexy and obstructive sleep apnea.  She was originally on nuvigil 100 mg daily.  This helped with her daytime alertness, but then she couldn't fall asleep at night.  She had dose decreased to 50 mg, but then she stayed to sleepy.  She also has to switch from IUD to alternative birth  control.  As a result she wants to try alternative stimulant medication.  She works at [employer].  She is okay as long as she stays active.  She has trouble staying awake once she gets home in the evening.  As a result she has trouble keeping up with what she wants to do with her personal time.  She goes to bed at 1130 pm and falls asleep instantly.  She sets her alarm for 745 am, but can't get out of bed for at least another 45 minutes.  Physical Exam  General - No distress ENT - No sinus tenderness, no oral exudate, no LAN, MP 4, enlarged tongue Cardiac - s1s2 regular, no murmur Chest - No wheeze/rales/dullness Back - No focal tenderness Abd - Soft, non-tender Ext - No edema Neuro - Normal strength Skin - No rashes Psych - normal mood, and behavior   Assessment/Plan  Narcolepsy w/o cataplexy. - will change her to methylphenidate 5 mg bid >> adjust does up as needed.  Discussed side effects to monitor for - d/c nuvigil - advised to try getting brief, schedule naps during the day  Obstructive sleep apnea. - continue auto CPAP   Patient Instructions  Stop using nuvigil  Methylphenidate 5 mg pill in first thing in the morning, and then second dose in afternoon but no later than 3 pm  Follow up with Dr. Craige Cotta in 4 to 6 weeks    Coralyn Helling, MD [facility] Pager:  [phone]

## 2016-09-15 NOTE — Patient Instructions (Signed)
Stop using nuvigil  Methylphenidate 5 mg pill in first thing in the morning, and then second dose in afternoon but no later than 3 pm  Follow up with Dr. Craige CottaSood in 4 to 6 weeks

## 2016-10-06 NOTE — Progress Notes (Deleted)
Created in error by Epic.

## 2016-10-13 ENCOUNTER — Ambulatory Visit (INDEPENDENT_AMBULATORY_CARE_PROVIDER_SITE_OTHER): Payer: Managed Care, Other (non HMO) | Admitting: Pulmonary Disease

## 2016-10-13 ENCOUNTER — Encounter: Payer: Self-pay | Admitting: Pulmonary Disease

## 2016-10-13 VITALS — BP 112/62 | HR 99 | Ht 66.0 in | Wt 201.2 lb

## 2016-10-13 DIAGNOSIS — Z9989 Dependence on other enabling machines and devices: Secondary | ICD-10-CM | POA: Diagnosis not present

## 2016-10-13 DIAGNOSIS — G4733 Obstructive sleep apnea (adult) (pediatric): Secondary | ICD-10-CM | POA: Diagnosis not present

## 2016-10-13 DIAGNOSIS — G47419 Narcolepsy without cataplexy: Secondary | ICD-10-CM | POA: Diagnosis not present

## 2016-10-13 MED ORDER — METHYLPHENIDATE HCL 5 MG PO TABS: 5.0000 mg | ORAL_TABLET | Freq: Two times a day (BID) | ORAL | 0 refills | Status: DC

## 2016-10-13 NOTE — Patient Instructions (Signed)
Follow up in 6 months 

## 2016-10-13 NOTE — Progress Notes (Signed)
Current Outpatient Prescriptions on File Prior to Visit  Medication Sig  . acetaminophen (TYLENOL) 325 MG tablet Take 650 mg by mouth every 6 (six) hours as needed for pain.  Marland Kitchen. dextromethorphan (DELSYM) 30 MG/5ML liquid Take by mouth as needed for cough. Reported on 07/02/2016  . drospirenone-ethinyl estradiol (YAZ,GIANVI,LORYNA) 3-0.02 MG tablet Take 1 tablet by mouth daily.  Marland Kitchen. loperamide (IMODIUM A-D) 2 MG tablet Take 1 tablet (2 mg total) by mouth as needed for diarrhea or loose stools.  Marland Kitchen. loratadine (CLARITIN) 10 MG tablet Take 10 mg by mouth daily.   No current facility-administered medications on file prior to visit.      Chief Complaint  Patient presents with  . Follow-up    Pt states that she has been doing well since last OV on new medication. Pt unable to take Ritalin BID d/t it causing HA. Pt states that she has been taking 1 tablet at lunch time - seems to work well. Pt states that she sleeps okay at night. Daytime drowsiness is better than before but not 100%     Sleep tests HST 09/24/15 >> AHI 12.4, SaO2 low 90%. Auto CPAP 03/23/16 to 04/21/16 >> used on 28 of 30 nights with average 5 hrs 54 minutes.  Average AHI 0.2 with median CPAP 5 and 95 th percentile CPAP 6 cm H2O PSG 05/26/16 >> AHI 1.4, SpO2 low 92%. MSLT 05/27/16 >> mean sleep latency 2:24 min, 5 of 5 naps with sleep, 1 of 5 SOREMs  Past medical history Nephrolithiasis  Past surgical history, Family history, Social history, Allergies reviewed.  Vital Signs BP 112/62 (BP Location: Left Arm, Cuff Size: Normal)   Pulse 99   Ht 5\' 6"  (1.676 m)   Wt 201 lb 3.2 oz (91.3 kg)   SpO2 96%   BMI 32.47 kg/m   History of Present Illness Lori Mann is a 25 y.o. female with narcolepsy w/o cataplexy and obstructive sleep apnea.  She got headache when taking methylphenidate twice per day.  She is taking 5 mg daily around lunch time now.  This regimen works best.  She denies palpitations, tremors, chest pain, nausea,  sweats.  Headaches better.  Physical Exam  General - No distress ENT - No sinus tenderness, no oral exudate, no LAN, MP 4, enlarged tongue Cardiac - s1s2 regular, no murmur Chest - No wheeze/rales/dullness Back - No focal tenderness Abd - Soft, non-tender Ext - No edema Neuro - Normal strength Skin - No rashes Psych - normal mood, and behavior   Assessment/Plan  Narcolepsy w/o cataplexy. - continue methylphenidate 5 mg bid as needed  - advised to try getting brief, schedule naps during the day - proper sleep hygiene discussed  Obstructive sleep apnea. - continue auto CPAP   Patient Instructions  Follow up in 6 months    Coralyn HellingVineet Yalissa Fink, MD [facility] Pager:  [phone]

## 2016-11-05 ENCOUNTER — Ambulatory Visit: Payer: Managed Care, Other (non HMO) | Admitting: Pulmonary Disease

## 2017-01-29 ENCOUNTER — Encounter: Payer: Self-pay | Admitting: Internal Medicine

## 2017-01-29 ENCOUNTER — Ambulatory Visit (INDEPENDENT_AMBULATORY_CARE_PROVIDER_SITE_OTHER): Payer: Managed Care, Other (non HMO) | Admitting: Internal Medicine

## 2017-01-29 VITALS — BP 136/78 | HR 95 | Temp 98.2°F | Ht 65.0 in | Wt 192.0 lb

## 2017-01-29 DIAGNOSIS — Z0000 Encounter for general adult medical examination without abnormal findings: Secondary | ICD-10-CM | POA: Diagnosis not present

## 2017-01-29 LAB — COMPREHENSIVE METABOLIC PANEL
ALBUMIN: 3.7 g/dL (ref 3.5–5.2)
ALT: 21 U/L (ref 0–35)
AST: 16 U/L (ref 0–37)
Alkaline Phosphatase: 40 U/L (ref 39–117)
BUN: 13 mg/dL (ref 6–23)
CHLORIDE: 107 meq/L (ref 96–112)
CO2: 26 mEq/L (ref 19–32)
CREATININE: 0.88 mg/dL (ref 0.40–1.20)
Calcium: 9.3 mg/dL (ref 8.4–10.5)
GFR: 81.4 mL/min (ref >60.00)
Glucose, Bld: 89 mg/dL (ref 70–99)
Potassium: 4.2 mEq/L (ref 3.5–5.1)
SODIUM: 140 meq/L (ref 135–145)
TOTAL PROTEIN: 6.8 g/dL (ref 6.0–8.3)
Total Bilirubin: 0.4 mg/dL (ref 0.2–1.2)

## 2017-01-29 LAB — CBC WITH DIFFERENTIAL/PLATELET
BASOS PCT: 0.4 % (ref 0.0–3.0)
Basophils Absolute: 0 10*3/uL (ref 0.0–0.1)
EOS ABS: 0.4 10*3/uL (ref 0.0–0.7)
Eosinophils Relative: 5.4 % — ABNORMAL HIGH (ref 0.0–5.0)
HCT: 37.6 % (ref 36.0–46.0)
Hemoglobin: 12.6 g/dL (ref 12.0–15.0)
LYMPHS ABS: 3 10*3/uL (ref 0.7–4.0)
Lymphocytes Relative: 39.4 % (ref 12.0–46.0)
MCHC: 33.7 g/dL (ref 30.0–36.0)
MCV: 89.7 fl (ref 78.0–100.0)
MONO ABS: 0.5 10*3/uL (ref 0.1–1.0)
Monocytes Relative: 6.1 % (ref 3.0–12.0)
NEUTROS PCT: 48.7 % (ref 43.0–77.0)
Neutro Abs: 3.7 10*3/uL (ref 1.4–7.7)
PLATELETS: 353 10*3/uL (ref 150.0–400.0)
RBC: 4.19 Mil/uL (ref 3.87–5.11)
RDW: 12.6 % (ref 11.5–15.5)
WBC: 7.7 10*3/uL (ref 4.0–10.5)

## 2017-01-29 LAB — TSH: TSH: 3.06 u[IU]/mL (ref 0.35–4.50)

## 2017-01-29 NOTE — Progress Notes (Signed)
Subjective:    Patient ID: Lori Mann, female    DOB: [birth year], 26 y.o.   MRN: [id number]  HPI  26 year old patient who is seen today for a wellness exam. She is followed by pulmonary medicine for narcolepsy and also has regular OB GYN follow-ups  She has done quite well on low-dose Ritalin for narcolepsy.  She has been quite sensitive to stimulants.  Past Medical History:  Diagnosis Date  . Allergy   . H/O infectious mononucleosis   . IBS (irritable bowel syndrome)   . Kidney stones   . Narcolepsy without cataplexy(347.00)      Social History   Social History  . Marital status: Single    Spouse name: N/A  . Number of children: N/A  . Years of education: N/A   Occupational History  . Not on file.   Social History Main Topics  . Smoking status: Never Smoker  . Smokeless tobacco: Never Used  . Alcohol use 0.0 oz/week  . Drug use: No  . Sexual activity: Yes    Birth control/ protection: Pill   Other Topics Concern  . Not on file   Social History Narrative  . No narrative on file    Past Surgical History:  Procedure Laterality Date  . CYSTOSCOPY WITH RETROGRADE PYELOGRAM, URETEROSCOPY AND STENT PLACEMENT Right 10/11/2015   Procedure: CYSTOSCOPY WITH RETROGRADE PYELOGRAM, URETEROSCOPY,STONE EXTRACTIONAND STENT PLACEMENT;  Surgeon: Marcine MatarStephen Dahlstedt, MD;  [facility];  Service: Urology;  Laterality: Right;  . HOLMIUM LASER APPLICATION Right 10/11/2015   Procedure: HOLMIUM LASER APPLICATION;  Surgeon: Marcine MatarStephen Dahlstedt, MD;  [facility] [place];  Service: Urology;  Laterality: Right;  . NASAL SEPTUM SURGERY    . TONSILLECTOMY      Family History  Problem Relation Age of Onset  . Cancer Mother     breast ca  . Arthritis Father   . Hyperlipidemia Father   . Irritable bowel syndrome Sister     No Known Allergies  Current Outpatient Prescriptions on File Prior to Visit  Medication Sig Dispense Refill  .  acetaminophen (TYLENOL) 325 MG tablet Take 650 mg by mouth every 6 (six) hours as needed for pain.    Marland Kitchen. dextromethorphan (DELSYM) 30 MG/5ML liquid Take by mouth as needed for cough. Reported on 07/02/2016    . drospirenone-ethinyl estradiol (YAZ,GIANVI,LORYNA) 3-0.02 MG tablet Take 1 tablet by mouth daily.    Marland Kitchen. loperamide (IMODIUM A-D) 2 MG tablet Take 1 tablet (2 mg total) by mouth as needed for diarrhea or loose stools. 30 tablet 0  . loratadine (CLARITIN) 10 MG tablet Take 10 mg by mouth daily.    . methylphenidate (RITALIN) 5 MG tablet Take 1 tablet (5 mg total) by mouth 2 (two) times daily. 60 tablet 0   No current facility-administered medications on file prior to visit.     BP 136/78 (BP Location: Left Arm, Patient Position: Sitting, Cuff Size: Normal)   Pulse 95   Temp 98.2 F (36.8 C) (Oral)   Ht 5\' 5"  (1.651 m)   Wt 192 lb (87.1 kg)   LMP 01/12/2017 (Approximate)   SpO2 98%   BMI 31.95 kg/m     Review of Systems  Constitutional: Negative.   HENT: Negative for congestion, dental problem, hearing loss, rhinorrhea, sinus pressure, sore throat and tinnitus.   Eyes: Negative for pain, discharge and visual disturbance.  Respiratory: Negative for cough and shortness of breath.   Cardiovascular: Negative for chest pain, palpitations  and leg swelling.  Gastrointestinal: Negative for abdominal distention, abdominal pain, blood in stool, constipation, diarrhea, nausea and vomiting.  Genitourinary: Negative for difficulty urinating, dysuria, flank pain, frequency, hematuria, pelvic pain, urgency, vaginal bleeding, vaginal discharge and vaginal pain.  Musculoskeletal: Negative for arthralgias, gait problem and joint swelling.  Skin: Negative for rash.  Neurological: Negative for dizziness, syncope, speech difficulty, weakness, numbness and headaches.  Hematological: Negative for adenopathy.  Psychiatric/Behavioral: Negative for agitation, behavioral problems and dysphoric mood. The  patient is not nervous/anxious.        Objective:   Physical Exam  Constitutional: She is oriented to person, place, and time. She appears well-developed and well-nourished.  Overweight Blood pressure 130/78  HENT:  Head: Normocephalic.  Right Ear: External ear normal.  Left Ear: External ear normal.  Mouth/Throat: Oropharynx is clear and moist.  Eyes: Conjunctivae and EOM are normal. Pupils are equal, round, and reactive to light.  Neck: Normal range of motion. Neck supple. No thyromegaly present.  Cardiovascular: Normal rate, regular rhythm, normal heart sounds and intact distal pulses.   Pulmonary/Chest: Effort normal and breath sounds normal.  Abdominal: Soft. Bowel sounds are normal. She exhibits no mass. There is no tenderness.  Musculoskeletal: Normal range of motion.  Lymphadenopathy:    She has no cervical adenopathy.  Neurological: She is alert and oriented to person, place, and time.  Reflexes brisk  Skin: Skin is warm and dry. No rash noted.  Psychiatric: She has a normal mood and affect. Her behavior is normal.          Assessment & Plan:   Preventive health exam Narcolepsy.  Continue Ritalin) pulmonary follow-up Obesity.  Weight loss.  More regular exercise.  All encouraged Nephrolithiasis, stable  OB/GYN.  Follow-up as scheduled  Rogelia Boga

## 2017-01-29 NOTE — Progress Notes (Signed)
Pre visit review using our clinic review tool, if applicable. No additional management support is needed unless otherwise documented below in the visit note. 

## 2017-01-29 NOTE — Patient Instructions (Addendum)
It is important that you exercise regularly, at least 20 minutes 3 to 4 times per week.  If you develop chest pain or shortness of breath seek  medical attention.  You need to lose weight.  Consider a lower calorie diet and regular exercise.  Health Maintenance, Female Introduction Adopting a healthy lifestyle and getting preventive care can go a long way to promote health and wellness. Talk with your health care provider about what schedule of regular examinations is right for you. This is a good chance for you to check in with your provider about disease prevention and staying healthy. In between checkups, there are plenty of things you can do on your own. Experts have done a lot of research about which lifestyle changes and preventive measures are most likely to keep you healthy. Ask your health care provider for more information. Weight and diet Eat a healthy diet  Be sure to include plenty of vegetables, fruits, low-fat dairy products, and lean protein.  Do not eat a lot of foods high in solid fats, added sugars, or salt.  Get regular exercise. This is one of the most important things you can do for your health.  Most adults should exercise for at least 150 minutes each week. The exercise should increase your heart rate and make you sweat (moderate-intensity exercise).  Most adults should also do strengthening exercises at least twice a week. This is in addition to the moderate-intensity exercise. Maintain a healthy weight  Body mass index (BMI) is a measurement that can be used to identify possible weight problems. It estimates body fat based on height and weight. Your health care provider can help determine your BMI and help you achieve or maintain a healthy weight.  For females 33 years of age and older:  A BMI below 18.5 is considered underweight.  A BMI of 18.5 to 24.9 is normal.  A BMI of 25 to 29.9 is considered overweight.  A BMI of 30 and above is considered  obese. Watch levels of cholesterol and blood lipids  You should start having your blood tested for lipids and cholesterol at 28 years of age, then have this test every 5 years.  You may need to have your cholesterol levels checked more often if:  Your lipid or cholesterol levels are high.  You are older than 28 years of age.  You are at high risk for heart disease. Cancer screening Lung Cancer  Lung cancer screening is recommended for adults 63-28 years old who are at high risk for lung cancer because of a history of smoking.  A yearly low-dose CT scan of the lungs is recommended for people who:  Currently smoke.  Have quit within the past 15 years.  Have at least a 30-pack-year history of smoking. A pack year is smoking an average of one pack of cigarettes a day for 1 year.  Yearly screening should continue until it has been 15 years since you quit.  Yearly screening should stop if you develop a health problem that would prevent you from having lung cancer treatment. Breast Cancer  Practice breast self-awareness. This means understanding how your breasts normally appear and feel.  It also means doing regular breast self-exams. Let your health care provider know about any changes, no matter how small.  If you are in your 20s or 30s, you should have a clinical breast exam (CBE) by a health care provider every 1-3 years as part of a regular health exam.  If you  are 78 or older, have a CBE every year. Also consider having a breast X-ray (mammogram) every year.  If you have a family history of breast cancer, talk to your health care provider about genetic screening.  If you are at high risk for breast cancer, talk to your health care provider about having an MRI and a mammogram every year.  Breast cancer gene (BRCA) assessment is recommended for women who have family members with BRCA-related cancers. BRCA-related cancers include:  Breast.  Ovarian.  Tubal.  Peritoneal  cancers.  Results of the assessment will determine the need for genetic counseling and BRCA1 and BRCA2 testing. Cervical Cancer  Your health care provider may recommend that you be screened regularly for cancer of the pelvic organs (ovaries, uterus, and vagina). This screening involves a pelvic examination, including checking for microscopic changes to the surface of your cervix (Pap test). You may be encouraged to have this screening done every 3 years, beginning at age 54.  For women ages 36-65, health care providers may recommend pelvic exams and Pap testing every 3 years, or they may recommend the Pap and pelvic exam, combined with testing for human papilloma virus (HPV), every 5 years. Some types of HPV increase your risk of cervical cancer. Testing for HPV may also be done on women of any age with unclear Pap test results.  Other health care providers may not recommend any screening for nonpregnant women who are considered low risk for pelvic cancer and who do not have symptoms. Ask your health care provider if a screening pelvic exam is right for you.  If you have had past treatment for cervical cancer or a condition that could lead to cancer, you need Pap tests and screening for cancer for at least 20 years after your treatment. If Pap tests have been discontinued, your risk factors (such as having a new sexual partner) need to be reassessed to determine if screening should resume. Some women have medical problems that increase the chance of getting cervical cancer. In these cases, your health care provider may recommend more frequent screening and Pap tests. Colorectal Cancer  This type of cancer can be detected and often prevented.  Routine colorectal cancer screening usually begins at 28 years of age and continues through 28 years of age.  Your health care provider may recommend screening at an earlier age if you have risk factors for colon cancer.  Your health care provider may also  recommend using home test kits to check for hidden blood in the stool.  A small camera at the end of a tube can be used to examine your colon directly (sigmoidoscopy or colonoscopy). This is done to check for the earliest forms of colorectal cancer.  Routine screening usually begins at age 43.  Direct examination of the colon should be repeated every 5-10 years through 28 years of age. However, you may need to be screened more often if early forms of precancerous polyps or small growths are found. Skin Cancer  Check your skin from head to toe regularly.  Tell your health care provider about any new moles or changes in moles, especially if there is a change in a mole's shape or color.  Also tell your health care provider if you have a mole that is larger than the size of a pencil eraser.  Always use sunscreen. Apply sunscreen liberally and repeatedly throughout the day.  Protect yourself by wearing long sleeves, pants, a wide-brimmed hat, and sunglasses whenever you are  outside. Heart disease, diabetes, and high blood pressure  High blood pressure causes heart disease and increases the risk of stroke. High blood pressure is more likely to develop in:  People who have blood pressure in the high end of the normal range (130-139/85-89 mm Hg).  People who are overweight or obese.  People who are African American.  If you are 61-54 years of age, have your blood pressure checked every 3-5 years. If you are 73 years of age or older, have your blood pressure checked every year. You should have your blood pressure measured twice-once when you are at a hospital or clinic, and once when you are not at a hospital or clinic. Record the average of the two measurements. To check your blood pressure when you are not at a hospital or clinic, you can use:  An automated blood pressure machine at a pharmacy.  A home blood pressure monitor.  If you are between 53 years and 82 years old, ask your health  care provider if you should take aspirin to prevent strokes.  Have regular diabetes screenings. This involves taking a blood sample to check your fasting blood sugar level.  If you are at a normal weight and have a low risk for diabetes, have this test once every three years after 28 years of age.  If you are overweight and have a high risk for diabetes, consider being tested at a younger age or more often. Preventing infection Hepatitis B  If you have a higher risk for hepatitis B, you should be screened for this virus. You are considered at high risk for hepatitis B if:  You were born in a country where hepatitis B is common. Ask your health care provider which countries are considered high risk.  Your parents were born in a high-risk country, and you have not been immunized against hepatitis B (hepatitis B vaccine).  You have HIV or AIDS.  You use needles to inject street drugs.  You live with someone who has hepatitis B.  You have had sex with someone who has hepatitis B.  You get hemodialysis treatment.  You take certain medicines for conditions, including cancer, organ transplantation, and autoimmune conditions. Hepatitis C  Blood testing is recommended for:  Everyone born from [birth year] through 1967.  Anyone with known risk factors for hepatitis C. Sexually transmitted infections (STIs)  You should be screened for sexually transmitted infections (STIs) including gonorrhea and chlamydia if:  You are sexually active and are younger than 28 years of age.  You are older than 28 years of age and your health care provider tells you that you are at risk for this type of infection.  Your sexual activity has changed since you were last screened and you are at an increased risk for chlamydia or gonorrhea. Ask your health care provider if you are at risk.  If you do not have HIV, but are at risk, it may be recommended that you take a prescription medicine daily to prevent HIV  infection. This is called pre-exposure prophylaxis (PrEP). You are considered at risk if:  You are sexually active and do not regularly use condoms or know the HIV status of your partner(s).  You take drugs by injection.  You are sexually active with a partner who has HIV. Talk with your health care provider about whether you are at high risk of being infected with HIV. If you choose to begin PrEP, you should first be tested for HIV. You should  then be tested every 3 months for as long as you are taking PrEP. Pregnancy  If you are premenopausal and you may become pregnant, ask your health care provider about preconception counseling.  If you may become pregnant, take 400 to 800 micrograms (mcg) of folic acid every day.  If you want to prevent pregnancy, talk to your health care provider about birth control (contraception). Osteoporosis and menopause  Osteoporosis is a disease in which the bones lose minerals and strength with aging. This can result in serious bone fractures. Your risk for osteoporosis can be identified using a bone density scan.  If you are 73 years of age or older, or if you are at risk for osteoporosis and fractures, ask your health care provider if you should be screened.  Ask your health care provider whether you should take a calcium or vitamin D supplement to lower your risk for osteoporosis.  Menopause may have certain physical symptoms and risks.  Hormone replacement therapy may reduce some of these symptoms and risks. Talk to your health care provider about whether hormone replacement therapy is right for you. Follow these instructions at home:  Schedule regular health, dental, and eye exams.  Stay current with your immunizations.  Do not use any tobacco products including cigarettes, chewing tobacco, or electronic cigarettes.  If you are pregnant, do not drink alcohol.  If you are breastfeeding, limit how much and how often you drink alcohol.  Limit  alcohol intake to no more than 1 drink per day for nonpregnant women. One drink equals 12 ounces of beer, 5 ounces of wine, or 1 ounces of hard liquor.  Do not use street drugs.  Do not share needles.  Ask your health care provider for help if you need support or information about quitting drugs.  Tell your health care provider if you often feel depressed.  Tell your health care provider if you have ever been abused or do not feel safe at home. This information is not intended to replace advice given to you by your health care provider. Make sure you discuss any questions you have with your health care provider. Document Released: 06/23/2011 Document Revised: 05/15/2016 Document Reviewed: 09/11/2015  2017 [employer]

## 2017-05-10 NOTE — Telephone Encounter (Signed)
No note needed 

## 2017-09-10 ENCOUNTER — Encounter: Payer: Self-pay | Admitting: Internal Medicine

## 2017-09-25 ENCOUNTER — Ambulatory Visit (INDEPENDENT_AMBULATORY_CARE_PROVIDER_SITE_OTHER): Payer: Managed Care, Other (non HMO) | Admitting: Podiatry

## 2017-09-25 ENCOUNTER — Ambulatory Visit (INDEPENDENT_AMBULATORY_CARE_PROVIDER_SITE_OTHER): Payer: Managed Care, Other (non HMO)

## 2017-09-25 ENCOUNTER — Encounter: Payer: Self-pay | Admitting: Podiatry

## 2017-09-25 VITALS — BP 100/64 | HR 76 | Resp 16

## 2017-09-25 DIAGNOSIS — M722 Plantar fascial fibromatosis: Secondary | ICD-10-CM | POA: Diagnosis not present

## 2017-09-25 MED ORDER — DICLOFENAC SODIUM 75 MG PO TBEC: 75.0000 mg | DELAYED_RELEASE_TABLET | Freq: Two times a day (BID) | ORAL | 2 refills | Status: DC

## 2017-09-25 MED ORDER — TRIAMCINOLONE ACETONIDE 10 MG/ML IJ SUSP
10.0000 mg | Freq: Once | INTRAMUSCULAR | Status: AC
  Administered 2017-09-25: 10 mg

## 2017-09-25 NOTE — Progress Notes (Signed)
Subjective:    Patient ID: Lori Mann, female   DOB: 26 y.o.   MRN: [id number]   HPI patient states she's about a lot of pain underneath her left heel as she is gotten more active and it's been hurting her for at least several months and does not remember specifically what she may have done. Patient does not smoke    Review of Systems  All other systems reviewed and are negative.       Objective:  Physical Exam  Constitutional: She appears well-developed and well-nourished.  Cardiovascular: Intact distal pulses.   Pulmonary/Chest: Effort normal.  Neurological: She is alert.  Skin: Skin is warm.  Nursing note and vitals reviewed.  neurovascular status found to be intact muscle strength adequate range of motion within normal limits with exquisite discomfort plantar aspect left heel with no proximal pain or no calcaneal pain. There is moderate swelling around this area     Assessment:    Acute plantar fasciitis left     Plan:    H&P condition reviewed and careful plantar injections administered 3 Milligan Kenalog 5 mill grams Xylocaine and applied fascial brace to placed on diclofenac 75 mg twice a day. Reappoint to recheck  X-rays are negative for signs of stress fracture with minimal spur formation

## 2017-09-25 NOTE — Patient Instructions (Signed)

## 2017-10-16 ENCOUNTER — Ambulatory Visit: Payer: Managed Care, Other (non HMO) | Admitting: Podiatry

## 2017-12-24 ENCOUNTER — Telehealth: Payer: Self-pay | Admitting: *Deleted

## 2017-12-24 NOTE — Telephone Encounter (Signed)
Request for refill Diclofenac for #90 Days. Dr. Charlsie Merlesegal states pt needs an appt if continuing to have pain. Return fax denying.

## 2018-06-21 ENCOUNTER — Emergency Department (HOSPITAL_COMMUNITY): Payer: Managed Care, Other (non HMO)

## 2018-06-21 ENCOUNTER — Ambulatory Visit (HOSPITAL_COMMUNITY)
Admission: EM | Admit: 2018-06-21 | Discharge: 2018-06-21 | Discharge status: Against Medical Advice | Payer: Managed Care, Other (non HMO)

## 2018-06-21 ENCOUNTER — Emergency Department (HOSPITAL_COMMUNITY)
Admission: EM | Admit: 2018-06-21 | Discharge: 2018-06-21 | Disposition: A | Discharge status: Home / Self Care | Payer: Managed Care, Other (non HMO) | Attending: Emergency Medicine | Admitting: Emergency Medicine

## 2018-06-21 ENCOUNTER — Encounter (HOSPITAL_COMMUNITY): Payer: Self-pay | Admitting: Emergency Medicine

## 2018-06-21 ENCOUNTER — Encounter: Payer: Self-pay | Admitting: *Deleted

## 2018-06-21 ENCOUNTER — Other Ambulatory Visit: Payer: Self-pay

## 2018-06-21 ENCOUNTER — Ambulatory Visit: Payer: Self-pay | Admitting: Internal Medicine

## 2018-06-21 DIAGNOSIS — R4586 Emotional lability: Secondary | ICD-10-CM | POA: Insufficient documentation

## 2018-06-21 DIAGNOSIS — F419 Anxiety disorder, unspecified: Secondary | ICD-10-CM | POA: Insufficient documentation

## 2018-06-21 DIAGNOSIS — Z79899 Other long term (current) drug therapy: Secondary | ICD-10-CM | POA: Diagnosis not present

## 2018-06-21 DIAGNOSIS — R0789 Other chest pain: Secondary | ICD-10-CM | POA: Diagnosis not present

## 2018-06-21 LAB — I-STAT BETA HCG BLOOD, ED (MC, WL, AP ONLY): I-stat hCG, quantitative: <5 m[IU]/mL (ref <5)

## 2018-06-21 LAB — BASIC METABOLIC PANEL WITH GFR
Anion gap: 8 (ref 5–15)
BUN: 8 mg/dL (ref 6–20)
CO2: 26 mmol/L (ref 22–32)
Calcium: 9.2 mg/dL (ref 8.9–10.3)
Chloride: 103 mmol/L (ref 98–111)
Creatinine, Ser: 0.77 mg/dL (ref 0.44–1.00)
GFR calc Af Amer: >60 mL/min (ref >60)
GFR calc non Af Amer: >60 mL/min (ref >60)
Glucose, Bld: 117 mg/dL — ABNORMAL HIGH (ref 70–99)
Potassium: 4.4 mmol/L (ref 3.5–5.1)
Sodium: 137 mmol/L (ref 135–145)

## 2018-06-21 LAB — CBC
HCT: 40.3 % (ref 36.0–46.0)
Hemoglobin: 13.2 g/dL (ref 12.0–15.0)
MCH: 29.7 pg (ref 26.0–34.0)
MCHC: 32.8 g/dL (ref 30.0–36.0)
MCV: 90.6 fL (ref 78.0–100.0)
Platelets: 359 K/uL (ref 150–400)
RBC: 4.45 MIL/uL (ref 3.87–5.11)
RDW: 12 % (ref 11.5–15.5)
WBC: 8 K/uL (ref 4.0–10.5)

## 2018-06-21 LAB — I-STAT TROPONIN, ED: Troponin i, poc: 0 ng/mL (ref 0.00–0.08)

## 2018-06-21 LAB — D-DIMER, QUANTITATIVE: D-Dimer, Quant: 0.27 ug/mL-FEU (ref 0.00–0.50)

## 2018-06-21 NOTE — Telephone Encounter (Signed)
Dr. K - Please advise. Thanks! 

## 2018-06-21 NOTE — ED Notes (Signed)
Pt verbalized understanding discharge instructions and denies any further needs or questions at this time. VS stable, ambulatory and steady gait.   

## 2018-06-21 NOTE — ED Triage Notes (Signed)
Pt reports left sided cp that started around 1100 today. Pt reports increased 8/10 pain on inspiration and with movement. Pt reports contacting primary dr and they sent her here to rule out a PE. Denies sob. Pt reports no recent travel.

## 2018-06-21 NOTE — Telephone Encounter (Signed)
She called in c/o chest pain in left side with deep breaths or with bending over since 11:00am this morning.   Not been sick with URI recently; no cardiac history.   No other symptoms other than sweating.   It's a sharp pain she is having with the deep breath or bending over.  Denies radiation of pain.   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   She has occasional coughing spells as a result of these allergies.   She had a coughing spell last night but no chest pain with it.   "This is normal for me to cough due to the cat allergy".  She is on birth control pills since she was 27 years old.  [facility] and they are going to check with Dr. Amador CunasKwiatkowski and call the pt back.  He will decide if she could go to the ED or if he is willing to see her in the office.   I let the pt know someone would be calling her back.    She was agreeable to this plan.    I let her know to go to the ED if her symptoms become worse.   She verbalized understanding of these instructions.  I routed my notes to Dr. Amador CunasKwiatkowski.    Reason for Disposition . Taking a deep breath makes pain worse  Answer Assessment - Initial Assessment Questions 1. LOCATION: "Where does it hurt?"       Pain in left side of my chest since 11:00am.   It's getting worse with a deep breath or when I bend over.    2. RADIATION: "Does the pain go anywhere else?" (e.g., into neck, jaw, arms, back)     No pain either arm, back, jaw or neck.    3. ONSET: "When did the chest pain begin?" (Minutes, hours or days)      11:00am.    I was walking into my office from running errands.   I walked up the stairs and the deep breath is a sharp pain that catches me.     I have really bad allergies especially to cats.   I had a coughing fit last night due to my allergy to cats.   This is normal for me. 4. PATTERN "Does the pain come and go, or has it been constant since it started?"   "Does it get worse with exertion?"      Deep breath, bending over makes it hurt bad.  5. DURATION: "How long does it last" (e.g., seconds, minutes, hours)     While doing the deep breath or bending over it really hurts but then it's just a dull pain. 6. SEVERITY: "How bad is the pain?"  (e.g., Scale 1-10; mild, moderate, or severe)    - MILD (1-3): doesn't interfere with normal activities     - MODERATE (4-7): interferes with normal activities or awakens from sleep    - SEVERE (8-10): excruciating pain, unable to do any normal activities       8.    I've had kidney stones and it's not as bad as that but it really catches me when it happens. 7. CARDIAC RISK FACTORS: "Do you have any history of heart problems or risk factors for heart disease?" (e.g., prior heart attack, angina; high blood pressure, diabetes, being overweight, high cholesterol, smoking, or strong family history of heart disease)  None of the above.   No family history. 8. PULMONARY RISK FACTORS: "Do you have any history of lung disease?"  (e.g., blood clots in lung, asthma, emphysema, birth control pills)     Take birth control pills.   Take for 3 weeks and have the 1 week break.  Been on since I was 27 years old. 9. CAUSE: "What do you think is causing the chest pain?"     I have no clue.    I wasn't doing anything out of the ordinary.   Just got back from a weekend at the beach. 10. OTHER SYMPTOMS: "Do you have any other symptoms?" (e.g., dizziness, nausea, vomiting, sweating, fever, difficulty breathing, cough)       I've been sweating and usually I'm cold in my office.  No difficulty breathing or coughing.   Not been sick with any URI.      I've been getting getting allergy shots for a long time.   I just had an allergy shot today.   I don't know if that could have anything to do with it. 11. PREGNANCY: "Is there any chance you are pregnant?" "When was your last menstrual period?"       No.  Protocols used: CHEST  PAIN-A-AH

## 2018-06-21 NOTE — ED Provider Notes (Signed)
[facility] EMERGENCY DEPARTMENT Provider Note   CSN: [id number] Arrival date & time: 06/21/18  1705     History   Chief Complaint Chief Complaint  Patient presents with  . Chest Pain    HPI Lori Mann is a 27 y.o. female.  HPI   27 year old female presents today with complaints of chest pain.  Patient notes chest pain this morning around 11 AM sharp in nature worse with inspiration, worse with movement or palpation over the left anterior upper chest wall.  Patient denies any fever chills nausea or vomiting, denies any cough or shortness of breath.  Patient denies any risk factors for DVT or PE, denies any lower extremity swelling or edema or orthopnea.  No cardiac history.  She was seen by primary care who recommended she come to the emergency room for PE rule out.    Past Medical History:  Diagnosis Date  . Allergy   . H/O infectious mononucleosis   . IBS (irritable bowel syndrome)   . Kidney stones   . Narcolepsy without cataplexy(347.00)     Patient Active Problem List   Diagnosis Date Noted  . Anxiety 06/21/2018  . Mood swings 06/21/2018  . Narcolepsy 07/02/2016  . Hypersomnia 05/27/2016  . Morbid obesity (HCC) 01/17/2016    Past Surgical History:  Procedure Laterality Date  . CYSTOSCOPY WITH RETROGRADE PYELOGRAM, URETEROSCOPY AND STENT PLACEMENT Right 10/11/2015   Procedure: CYSTOSCOPY WITH RETROGRADE PYELOGRAM, URETEROSCOPY,STONE EXTRACTIONAND STENT PLACEMENT;  Surgeon: Marcine Matar, MD;  Location: [facility];  Service: Urology;  Laterality: Right;  . HOLMIUM LASER APPLICATION Right 10/11/2015   Procedure: HOLMIUM LASER APPLICATION;  Surgeon: Marcine Matar, MD;  Location: [facility];  Service: Urology;  Laterality: Right;  . NASAL SEPTUM SURGERY    . TONSILLECTOMY       OB History   None      Home Medications    Prior to Admission medications   Medication Sig Start Date End Date Taking?  Authorizing Provider  acetaminophen (TYLENOL) 325 MG tablet Take 650 mg by mouth every 6 (six) hours as needed for pain.   Yes [provider]  desloratadine (CLARINEX) 5 MG tablet Take 5 mg by mouth at bedtime.   Yes [provider]  dextromethorphan (DELSYM) 30 MG/5ML liquid Take by mouth as needed for cough. Reported on 07/02/2016   Yes [provider]  drospirenone-ethinyl estradiol (YAZ,GIANVI,LORYNA) 3-0.02 MG tablet Take 1 tablet by mouth daily.   Yes [provider]  EPINEPHrine (EPIPEN 2-PAK) 0.3 mg/0.3 mL IJ SOAJ injection Inject 0.3 mg into the muscle once as needed (allergic reaction).    Yes [provider]  levocetirizine (XYZAL) 5 MG tablet Take 5 mg by mouth daily.   Yes [provider]  loperamide (IMODIUM A-D) 2 MG tablet Take 1 tablet (2 mg total) by mouth as needed for diarrhea or loose stools. 04/30/16  Yes Hilarie Fredrickson, MD  methylphenidate (RITALIN) 5 MG tablet Take 1 tablet (5 mg total) by mouth 2 (two) times daily. Patient taking differently: Take 5 mg by mouth 2 (two) times daily as needed (Narcolepsy).  10/13/16  Yes Coralyn Helling, MD  diclofenac (VOLTAREN) 75 MG EC tablet Take 1 tablet (75 mg total) by mouth 2 (two) times daily. Patient not taking: Reported on 06/21/2018 09/25/17   Lenn Sink, DPM    Family History Family History  Problem Relation Age of Onset  . Cancer Mother  breast ca  . Arthritis Father   . Hyperlipidemia Father   . Irritable bowel syndrome Sister     Social History Social History   Tobacco Use  . Smoking status: Never Smoker  . Smokeless tobacco: Never Used  Substance Use Topics  . Alcohol use: Yes    Alcohol/week: 0.0 oz  . Drug use: No     Allergies   Nickel and Benzoyl peroxide   Review of Systems Review of Systems  All other systems reviewed and are negative.   Physical Exam Updated Vital Signs BP 132/80   Pulse 85   Temp 98.8 F (37.1 C) (Oral)   Resp  15   Ht 5\' 6"  (1.676 m)   Wt 81.6 kg (180 lb)   LMP 06/13/2018   SpO2 100%   BMI 29.05 kg/m   Physical Exam  Constitutional: She is oriented to person, place, and time. She appears well-developed and well-nourished.  HENT:  Head: Normocephalic and atraumatic.  Eyes: Pupils are equal, round, and reactive to light. Conjunctivae are normal. Right eye exhibits no discharge. Left eye exhibits no discharge. No scleral icterus.  Neck: Normal range of motion. No JVD present. No tracheal deviation present.  Cardiovascular: Normal rate, regular rhythm, normal heart sounds and intact distal pulses.  Pulmonary/Chest: Effort normal. No stridor. No respiratory distress. She has no wheezes. She has no rales. She exhibits no tenderness.  Chest wall with minimal tenderness palpation at the right upper wall and superior breast, no masses felt, lungs clear throughout  Neurological: She is alert and oriented to person, place, and time. Coordination normal.  Psychiatric: She has a normal mood and affect. Her behavior is normal. Judgment and thought content normal.  Nursing note and vitals reviewed.    ED Treatments / Results  Labs (all labs ordered are listed, but only abnormal results are displayed) Labs Reviewed  BASIC METABOLIC PANEL - Abnormal; Notable for the following components:      Result Value   Glucose, Bld 117 (*)    All other components within normal limits  CBC  D-DIMER, QUANTITATIVE (NOT AT [facility])  I-STAT TROPONIN, ED  I-STAT BETA HCG BLOOD, ED ([facility], [facility], [facility] ONLY)    EKG None  Radiology Dg Chest 2 View  Result Date: 06/21/2018 CLINICAL DATA:  Acute LEFT chest pain. EXAM: CHEST - 2 VIEW COMPARISON:  None. FINDINGS: Mild cardiomegaly noted. There is no evidence of focal airspace disease, pulmonary edema, suspicious pulmonary nodule/mass, pleural effusion, or pneumothorax. No acute bony abnormalities are identified. IMPRESSION: Mild cardiomegaly without evidence of acute cardiopulmonary  disease. Electronically Signed   By: Harmon Pier M.D.   On: 06/21/2018 18:04    Procedures Procedures (including critical care time)  Medications Ordered in ED Medications - No data to display   Initial Impression / Assessment and Plan / ED Course  I have reviewed the triage vital signs and the nursing notes.  Pertinent labs & imaging results that were available during my care of the patient were reviewed by me and considered in my medical decision making (see chart for details).     26 year old female presents today with likely chest wall pain.  Low suspicion for PE, d-dimer normal, remainder of exam reassuring.  Given the location with some breast tissue involvement, I recommended outpatient follow-up with primary care if symptoms persist for imaging studies.  Patient did have mild cardiomegaly with no other acute findings, I was unable to review any previous chest x-rays, encouraged to follow-up  with primary care for reevaluation.  Patient is given strict return precautions, verbalized understanding and agreement to today's plan had no further questions concerns at time discharge.  Final Clinical Impressions(s) / ED Diagnoses   Final diagnoses:  Chest wall pain    ED Discharge Orders    None       Rosalio LoudHedges, Lucia Mccreadie, PA-C 06/21/18 2211    Bethann BerkshireZammit, Joseph, MD 06/24/18 1008

## 2018-06-21 NOTE — Discharge Instructions (Addendum)
Please read attached information. If you experience any new or worsening signs or symptoms please return to the emergency room for evaluation. Please follow-up with your primary care provider or specialist as discussed.  °

## 2018-06-21 NOTE — Telephone Encounter (Signed)
Patient is currently at [facility] ED.. 

## 2018-06-21 NOTE — ED Notes (Signed)
Pt reports pain gets worse with movement and becomes sharp.

## 2018-06-21 NOTE — Telephone Encounter (Signed)
Pt calling back again.

## 2018-06-21 NOTE — Telephone Encounter (Signed)
Pt [facility]  Pt advised per Dr Kirtland BouchardK to rule out PE Pt is going to UC most likely Will hold for follow up.

## 2018-10-06 ENCOUNTER — Ambulatory Visit: Payer: Managed Care, Other (non HMO) | Admitting: Family Medicine

## 2018-10-06 ENCOUNTER — Encounter: Payer: Self-pay | Admitting: Family Medicine

## 2018-10-06 VITALS — BP 100/72 | HR 112 | Temp 98.8°F | Wt 198.0 lb

## 2018-10-06 DIAGNOSIS — M545 Low back pain, unspecified: Secondary | ICD-10-CM

## 2018-10-06 MED ORDER — MELOXICAM 7.5 MG PO TABS: 7.5000 mg | ORAL_TABLET | Freq: Every day | ORAL | 0 refills | Status: DC

## 2018-10-06 MED ORDER — KETOROLAC TROMETHAMINE 60 MG/2ML IM SOLN
60.0000 mg | Freq: Once | INTRAMUSCULAR | Status: AC
  Administered 2018-10-06: 60 mg via INTRAMUSCULAR

## 2018-10-06 MED ORDER — CYCLOBENZAPRINE HCL 5 MG PO TABS: 5.0000 mg | ORAL_TABLET | Freq: Three times a day (TID) | ORAL | 1 refills | Status: DC | PRN

## 2018-10-06 NOTE — Patient Instructions (Signed)
Back Pain, Adult Many adults have back pain from time to time. Common causes of back pain include:  A strained muscle or ligament.  Wear and tear (degeneration) of the spinal disks.  Arthritis.  A hit to the back.  Back pain can be short-lived (acute) or last a long time (chronic). A physical exam, lab tests, and imaging studies may be done to find the cause of your pain. Follow these instructions at home: Managing pain and stiffness  Take over-the-counter and prescription medicines only as told by your health care provider.  If directed, apply heat to the affected area as often as told by your health care provider. Use the heat source that your health care provider recommends, such as a moist heat pack or a heating pad. ? Place a towel between your skin and the heat source. ? Leave the heat on for 20-30 minutes. ? Remove the heat if your skin turns bright red. This is especially important if you are unable to feel pain, heat, or cold. You have a greater risk of getting burned.  If directed, apply ice to the injured area: ? Put ice in a plastic bag. ? Place a towel between your skin and the bag. ? Leave the ice on for 20 minutes, 2-3 times a day for the first 2-3 days. Activity  Do not stay in bed. Resting more than 1-2 days can delay your recovery.  Take short walks on even surfaces as soon as you are able. Try to increase the length of time you walk each day.  Do not sit, drive, or stand in one place for more than 30 minutes at a time. Sitting or standing for long periods of time can put stress on your back.  Use proper lifting techniques. When you bend and lift, use positions that put less stress on your back: ? Beecher your knees. ? Keep the load close to your body. ? Avoid twisting.  Exercise regularly as told by your health care provider. Exercising will help your back heal faster. This also helps prevent back injuries by keeping muscles strong and flexible.  Your health  care provider may recommend that you see a physical therapist. This person can help you come up with a safe exercise program. Do any exercises as told by your physical therapist. Lifestyle  Maintain a healthy weight. Extra weight puts stress on your back and makes it difficult to have good posture.  Avoid activities or situations that make you feel anxious or stressed. Learn ways to manage anxiety and stress. One way to manage stress is through exercise. Stress and anxiety increase muscle tension and can make back pain worse. General instructions  Sleep on a firm mattress in a comfortable position. Try lying on your side with your knees slightly bent. If you lie on your back, put a pillow under your knees.  Follow your treatment plan as told by your health care provider. This may include: ? Cognitive or behavioral therapy. ? Acupuncture or massage therapy. ? Meditation or yoga. Contact a health care provider if:  You have pain that is not relieved with rest or medicine.  You have increasing pain going down into your legs or buttocks.  Your pain does not improve in 2 weeks.  You have pain at night.  You lose weight.  You have a fever or chills. Get help right away if:  You develop new bowel or bladder control problems.  You have unusual weakness or numbness in your arms  or legs.  You develop nausea or vomiting.  You develop abdominal pain.  You feel faint. Summary  Many adults have back pain from time to time. A physical exam, lab tests, and imaging studies may be done to find the cause of your pain.  Use proper lifting techniques. When you bend and lift, use positions that put less stress on your back.  Take over-the-counter and prescription medicines and apply heat or ice as directed by your health care provider. This information is not intended to replace advice given to you by your health care provider. Make sure you discuss any questions you have with your health care  provider. Document Released: 12/08/2005 Document Revised: 01/12/2017 Document Reviewed: 01/12/2017 Elsevier Interactive Patient Education  2018 [employer] Injury Prevention Back injuries can be very painful. They can also be difficult to heal. After having one back injury, you are more likely to injure your back again. It is important to learn how to avoid injuring or re-injuring your back. The following tips can help you to prevent a back injury. What should I know about physical fitness?  Exercise for 30 minutes per day on most days of the week or as told by your doctor. Make sure to: ? Do aerobic exercises, such as walking, jogging, biking, or swimming. ? Do exercises that increase balance and strength, such as tai chi and yoga. ? Do stretching exercises. This helps with flexibility. ? Try to develop strong belly (abdominal) muscles. Your belly muscles help to support your back.  Stay at a healthy weight. This helps to decrease your risk of a back injury. What should I know about my diet?  Talk with your doctor about your overall diet. Take supplements and vitamins only as told by your doctor.  Talk with your doctor about how much calcium and vitamin D you need each day. These nutrients help to prevent weakening of the bones (osteoporosis).  Include good sources of calcium in your diet, such as: ? Dairy products. ? Green leafy vegetables. ? Products that have had calcium added to them (fortified).  Include good sources of vitamin D in your diet, such as: ? Milk. ? Foods that have had vitamin D added to them. What should I know about my posture?  Sit up straight and stand up straight. Avoid leaning forward when you sit or hunching over when you stand.  Choose chairs that have good low-back (lumbar) support.  If you work at a desk, sit close to it so you do not need to lean over. Keep your chin tucked in. Keep your neck drawn back. Keep your elbows bent so your arms look  like the letter "L" (right angle).  Sit high and close to the steering wheel when you drive. Add a low-back support to your car seat, if needed.  Avoid sitting or standing in one position for very long. Take breaks to get up, stretch, and walk around at least one time every hour. Take breaks every hour if you are driving for long periods of time.  Sleep on your side with your knees slightly bent, or sleep on your back with a pillow under your knees. Do not lie on the front of your body to sleep. What should I know about lifting, twisting, and reaching? Lifting and Heavy Lifting   Avoid heavy lifting, especially lifting over and over again. If you must do heavy lifting: ? Stretch before lifting. ? Work slowly. ? Rest between lifts. ? Use a  tool such as a cart or a dolly to move objects if one is available. ? Make several small trips instead of carrying one heavy load. ? Ask for help when you need it, especially when moving big objects.  Follow these steps when lifting: ? Stand with your feet shoulder-width apart. ? Get as close to the object as you can. Do not pick up a heavy object that is far from your body. ? Use handles or lifting straps if they are available. ? Bend at your knees. Squat down, but keep your heels off the floor. ? Keep your shoulders back. Keep your chin tucked in. Keep your back straight. ? Lift the object slowly while you tighten the muscles in your legs, belly, and butt. Keep the object as close to the center of your body as possible.  Follow these steps when putting down a heavy load: ? Stand with your feet shoulder-width apart. ? Lower the object slowly while you tighten the muscles in your legs, belly, and butt. Keep the object as close to the center of your body as possible. ? Keep your shoulders back. Keep your chin tucked in. Keep your back straight. ? Bend at your knees. Squat down, but keep your heels off the floor. ? Use handles or lifting straps if they  are available. Twisting and Reaching  Avoid lifting heavy objects above your waist.  Do not twist at your waist while you are lifting or carrying a load. If you need to turn, move your feet.  Do not bend over without bending at your knees.  Avoid reaching over your head, across a table, or for an object on a high surface. What are some other tips?  Avoid wet floors and icy ground. Keep sidewalks clear of ice to prevent falls.  Do not sleep on a mattress that is too soft or too hard.  Keep items that you use often within easy reach.  Put heavier objects on shelves at waist level, and put lighter objects on lower or higher shelves.  Find ways to lower your stress, such as: ? Exercise. ? Massage. ? Relaxation techniques.  Talk with your doctor if you feel anxious or depressed. These conditions can make back pain worse.  Wear flat heel shoes with cushioned soles.  Avoid making quick (sudden) movements.  Use both shoulder straps when carrying a backpack.  Do not use any tobacco products, including cigarettes, chewing tobacco, or electronic cigarettes. If you need help quitting, ask your doctor. This information is not intended to replace advice given to you by your health care provider. Make sure you discuss any questions you have with your health care provider. Document Released: 05/26/2008 Document Revised: 05/15/2016 Document Reviewed: 12/12/2014 Elsevier Interactive Patient Education  [employer].

## 2018-10-06 NOTE — Progress Notes (Signed)
Subjective:    Patient ID: Lori Mann, female    DOB: October 13, 1991, 27 y.o.   MRN: [id number]  No chief complaint on file. Patient accompanied by her mother Debroah Loop, a pt of this provider  HPI Patient was seen today for acute concern.  Pt endorses bending over drying off in the shower when she felt her spine moves to the the right before going back into place.  Pt experienced excruciating pain.  She was seen by her chiropractor for an adjustment which did not help.  Pt endorses a stabbing pain in her low back that occasionally moves to the front of her groin/thigh.  Also endorses feeling of spasms in her back in a jolt of pain.  Pt endorses being unable to sit or stand for prolonged periods.  Laying down is okay for a while.  Past Medical History:  Diagnosis Date  . Allergy   . H/O infectious mononucleosis   . IBS (irritable bowel syndrome)   . Kidney stones   . Narcolepsy without cataplexy(347.00)     Allergies  Allergen Reactions  . Nickel Rash    Burning rash  . Benzoyl Peroxide Rash    ROS General: Denies fever, chills, night sweats, changes in weight, changes in appetite HEENT: Denies headaches, ear pain, changes in vision, rhinorrhea, sore throat CV: Denies CP, palpitations, SOB, orthopnea Pulm: Denies SOB, cough, wheezing GI: Denies abdominal pain, nausea, vomiting, diarrhea, constipation GU: Denies dysuria, hematuria, frequency, vaginal discharge Msk: Denies muscle cramps, joint pains  +low back pain and spasms Neuro: Denies weakness, numbness, tingling Skin: Denies rashes, bruising Psych: Denies depression, anxiety, hallucinations     Objective:    Blood pressure 100/72, pulse (!) 112, temperature 98.8 F (37.1 C), temperature source Oral, weight 198 lb (89.8 kg), SpO2 97 %.   Gen. Pleasant, well-nourished, in mild distress standing, normal affect   Lungs: no accessory muscle use Cardiovascular: RRR, no peripheral edema Musculoskeletal:  No TTP of spine,  paraspinal muscles, or sciatic nerves.  Limited ROM, unable to flex or extend spine without pain.  Lateral movement of spine also causes pain.  No deformities, no cyanosis or clubbing, normal tone Neuro:  A&Ox3, CN II-XII intact, ambulates slowly to exam room.  Wt Readings from Last 3 Encounters:  10/06/18 198 lb (89.8 kg)  06/21/18 180 lb (81.6 kg)  01/29/17 192 lb (87.1 kg)    Lab Results  Component Value Date   WBC 8.0 06/21/2018   HGB 13.2 06/21/2018   HCT 40.3 06/21/2018   PLT 359 06/21/2018   GLUCOSE 117 (H) 06/21/2018   CHOL 157 06/26/2015   TRIG 183.0 (H) 06/26/2015   HDL 46.60 06/26/2015   LDLCALC 74 06/26/2015   ALT 21 01/29/2017   AST 16 01/29/2017   NA 137 06/21/2018   K 4.4 06/21/2018   CL 103 06/21/2018   CREATININE 0.77 06/21/2018   BUN 8 06/21/2018   CO2 26 06/21/2018   TSH 3.06 01/29/2017    Assessment/Plan:  Lumbar back pain  -given Toradol IM injection this visit which helped with pain -discussed heat, massage, NSAIDs, stretching when able. -Reviewed various stretching exercises for patient to try when able. -We will try muscle relaxer and Mobic -X-ray ordered, pt wishes to wait to have this done - Plan: cyclobenzaprine (FLEXERIL) 5 MG tablet, meloxicam (MOBIC) 7.5 MG tablet, DG Lumbar Spine Complete, ketorolac (TORADOL) injection 60 mg  F/u prn   More than 50% of over 20 minutes spent in total face-to-face  with the patient, counseling and/or coordinating care.   Abbe Amsterdam, MD

## 2018-11-03 ENCOUNTER — Encounter: Payer: Self-pay | Admitting: Family Medicine

## 2018-11-03 ENCOUNTER — Ambulatory Visit: Payer: Self-pay

## 2018-11-03 ENCOUNTER — Ambulatory Visit: Payer: Managed Care, Other (non HMO) | Admitting: Family Medicine

## 2018-11-03 VITALS — BP 125/77 | HR 94 | Temp 98.4°F | Resp 12 | Ht 66.0 in | Wt 202.4 lb

## 2018-11-03 DIAGNOSIS — J069 Acute upper respiratory infection, unspecified: Secondary | ICD-10-CM | POA: Diagnosis not present

## 2018-11-03 DIAGNOSIS — J309 Allergic rhinitis, unspecified: Secondary | ICD-10-CM

## 2018-11-03 DIAGNOSIS — R110 Nausea: Secondary | ICD-10-CM | POA: Diagnosis not present

## 2018-11-03 DIAGNOSIS — R519 Headache, unspecified: Secondary | ICD-10-CM

## 2018-11-03 DIAGNOSIS — R51 Headache: Secondary | ICD-10-CM | POA: Diagnosis not present

## 2018-11-03 MED ORDER — AZELASTINE HCL 0.1 % NA SOLN: 1.0000 | Freq: Two times a day (BID) | NASAL | 1 refills | Status: DC

## 2018-11-03 MED ORDER — ONDANSETRON HCL 4 MG PO TABS: 4.0000 mg | ORAL_TABLET | Freq: Three times a day (TID) | ORAL | 0 refills | Status: AC | PRN

## 2018-11-03 MED ORDER — KETOROLAC TROMETHAMINE 60 MG/2ML IM SOLN
60.0000 mg | Freq: Once | INTRAMUSCULAR | Status: AC
  Administered 2018-11-03: 60 mg via INTRAMUSCULAR

## 2018-11-03 NOTE — Progress Notes (Signed)
ACUTE VISIT  HPI:  Chief Complaint  Patient presents with  . Dizziness    sx started Saturday  . Headache  . Nausea  . Nasal Congestion    Lori Mann is a 27 y.o.female here today complaining of 4 days of respiratory symptoms. 4 days of bitemporal headache, pressure-like, 6/10.  It seems to be exacerbated by working in the computer. No associated visual changes. She has history of migraines, usually associated with nausea and photophobia and happen once per year. + Nausea, no vomiting. No abdominal pain or diarrhea. LMP 4 weeks ago, she denies any possibility of pregnancy (on OCP).  3 days of nonproductive cough, nasal congestion, and rhinorrhea  URI   This is a new problem. The current episode started in the past 7 days. The problem has been unchanged. There has been no fever. Associated symptoms include congestion, coughing, headaches, nausea, a plugged ear sensation, rhinorrhea and a sore throat. Pertinent negatives include no abdominal pain, diarrhea, ear pain, neck pain, rash, sneezing, swollen glands, vomiting or wheezing. She has tried acetaminophen for the symptoms. The treatment provided mild relief.    [place] some people were coughing in the airplane but she is not sure about sick exposure. No known insect bite.  Allergic rhinitis, currently she is on Xyzal 5 mg daily, Clarinex 5 mg daily. Flonase nasal spray causes nosebleed.   OTC medications for this problem: Mucinex, Tylenol, and  Excedrin migraines.  She completed oral steroid treatment a few days ago, prescribed to treat lower back pain.   Review of Systems  Constitutional: Positive for fatigue. Negative for activity change, appetite change and fever.  HENT: Positive for congestion, postnasal drip, rhinorrhea and sore throat. Negative for ear pain, mouth sores, sinus pressure, sneezing, trouble swallowing and voice change.   Eyes: Negative for discharge, redness and  itching.  Respiratory: Positive for cough. Negative for shortness of breath and wheezing.   Cardiovascular: Negative.   Gastrointestinal: Positive for nausea. Negative for abdominal pain, diarrhea and vomiting.  Musculoskeletal: Negative for back pain, joint swelling, myalgias and neck pain.  Skin: Negative for rash.  Allergic/Immunologic: Positive for environmental allergies.  Neurological: Positive for dizziness and headaches. Negative for syncope, weakness and numbness.  Hematological: Negative for adenopathy. Does not bruise/bleed easily.  Psychiatric/Behavioral: The patient is nervous/anxious.       Current Outpatient Medications on File Prior to Visit  Medication Sig Dispense Refill  . acetaminophen (TYLENOL) 325 MG tablet Take 650 mg by mouth every 6 (six) hours as needed for pain.    Marland Kitchen. desloratadine (CLARINEX) 5 MG tablet Take 5 mg by mouth at bedtime.    Marland Kitchen. dextromethorphan (DELSYM) 30 MG/5ML liquid Take by mouth as needed for cough. Reported on 07/02/2016    . drospirenone-ethinyl estradiol (YAZ,GIANVI,LORYNA) 3-0.02 MG tablet Take 1 tablet by mouth daily.    Marland Kitchen. EPINEPHrine (EPIPEN 2-PAK) 0.3 mg/0.3 mL IJ SOAJ injection Inject 0.3 mg into the muscle once as needed (allergic reaction).     Marland Kitchen. levocetirizine (XYZAL) 5 MG tablet Take 5 mg by mouth daily.    . methylphenidate (RITALIN) 5 MG tablet Take 1 tablet (5 mg total) by mouth 2 (two) times daily. (Patient taking differently: Take 5 mg by mouth 2 (two) times daily as needed (Narcolepsy). ) 60 tablet 0  . Olopatadine HCl 0.2 % SOLN olopatadine 0.2 % eye drops  INSTILL ONE DROP INTO BOTH EYES EVERY DAY  No current facility-administered medications on file prior to visit.      Past Medical History:  Diagnosis Date  . Allergy   . H/O infectious mononucleosis   . IBS (irritable bowel syndrome)   . Kidney stones   . Narcolepsy without cataplexy(347.00)    Allergies  Allergen Reactions  . Nickel Rash    Burning rash  .  Benzoyl Peroxide Rash    Social History   Socioeconomic History  . Marital status: Single    Spouse name: Not on file  . Number of children: Not on file  . Years of education: Not on file  . Highest education level: Not on file  Occupational History  . Not on file  Social Needs  . Financial resource strain: Not on file  . Food insecurity:    Worry: Not on file    Inability: Not on file  . Transportation needs:    Medical: Not on file    Non-medical: Not on file  Tobacco Use  . Smoking status: Never Smoker  . Smokeless tobacco: Never Used  Substance and Sexual Activity  . Alcohol use: Yes    Alcohol/week: 0.0 standard drinks  . Drug use: No  . Sexual activity: Yes    Birth control/protection: Pill  Lifestyle  . Physical activity:    Days per week: Not on file    Minutes per session: Not on file  . Stress: Not on file  Relationships  . Social connections:    Talks on phone: Not on file    Gets together: Not on file    Attends religious service: Not on file    Active member of club or organization: Not on file    Attends meetings of clubs or organizations: Not on file    Relationship status: Not on file  Other Topics Concern  . Not on file  Social History Narrative  . Not on file    Vitals:   11/03/18 1620  BP: 125/77  Pulse: 94  Resp: 12  Temp: 98.4 F (36.9 C)  SpO2: 97%   Body mass index is 32.66 kg/m.   Physical Exam  Constitutional: She is oriented to person, place, and time. She appears well-developed. She does not appear ill. No distress.  HENT:  Head: Atraumatic.  Right Ear: Tympanic membrane, external ear and ear canal normal.  Left Ear: Tympanic membrane, external ear and ear canal normal.  Nose: Rhinorrhea present. Right sinus exhibits no maxillary sinus tenderness and no frontal sinus tenderness. Left sinus exhibits no maxillary sinus tenderness and no frontal sinus tenderness.  Mouth/Throat: Uvula is midline, oropharynx is clear and moist  and mucous membranes are normal.  Normal sinus transillumination. Postnasal drainage.  Eyes: Conjunctivae are normal.  Neck: No Brudzinski's sign and no Kernig's sign noted.  Cardiovascular: Normal rate and regular rhythm.  No murmur heard. Respiratory: Effort normal and breath sounds normal. No stridor. No respiratory distress.  GI: Soft. She exhibits no mass. There is no tenderness.  Lymphadenopathy:       Head (right side): No submandibular adenopathy present.       Head (left side): No submandibular adenopathy present.    She has cervical adenopathy (< 1 cm).       Right cervical: Posterior cervical adenopathy present.       Left cervical: Posterior cervical adenopathy present.  Neurological: She is alert and oriented to person, place, and time. She has normal strength. Gait abnormal.  Skin: Skin is warm. No  rash noted. No erythema.  Psychiatric: Her mood appears anxious.  Well groomed, good eye contact.    ASSESSMENT AND PLAN:   Ms. Jewel was seen today for dizziness, headache, nausea and nasal congestion.  Diagnoses and all orders for this visit:  URI, acute Most likely viral etiology, so symptomatic treatment were recommended. Adequate hydration. Explained that cough and congestion can last a few days and even weeks. Instructed to monitor for new onset of fever or worsening symptoms. Follow-up as needed.  -     ketorolac (TORADOL) injection 60 mg  Headache, unspecified headache type Most likely related to viral illness. I do not think head imaging is needed today. Here in the office and after verbal consent she received Toradol 60 mg IM. Recommend continuing with acetaminophen 500 mg 3-4 times per day. Clearly instructed about warning signs.  -     ketorolac (TORADOL) injection 60 mg  Nausea without vomiting Symptomatic treatment with Zofran 4 mg 3 times daily as needed recommended. Small and frequent sips of clear fluids. Follow-up as needed.  -      ondansetron (ZOFRAN) 4 MG tablet; Take 1 tablet (4 mg total) by mouth every 8 (eight) hours as needed for up to 5 days for nausea or vomiting.  Allergic rhinitis, unspecified seasonality, unspecified trigger This problem could be contributing to URI symptoms. No changes in Clarinex or Xyzal. Astelin nasal spray added today. Recommend saline nasal irrigations several times per day. Follow-up as needed with PCP.  -     azelastine (ASTELIN) 0.1 % nasal spray; Place 1 spray into both nostrils 2 (two) times daily. Use in each nostril as directed      Little Winton G. [place], MD  [facility]. [place] office.

## 2018-11-03 NOTE — Telephone Encounter (Signed)
Noted  

## 2018-11-03 NOTE — Patient Instructions (Addendum)
  [name] I have seen you today for an acute visit.  A few things to remember from today's visit:   URI, acute  Headache, unspecified headache type  Nausea without vomiting - Plan: ondansetron (ZOFRAN) 4 MG tablet  Allergic rhinitis, unspecified seasonality, unspecified trigger - Plan: azelastine (ASTELIN) 0.1 % nasal spray  I think the headache is part of the viral syndrome.  Here in the office you received Toradol 60 mg injection. You can continue with Tylenol 500 mg 3-4 times per day as needed or Excedrin headache.  Please seek immediate medical attention if headache becomes worse suddenly. Plenty of nasal saline irrigations through the day. Astelin nasal spray may help with symptoms.  If medications prescribed today, they will not be refill upon request, a follow up appointment with PCP will be necessary to discuss continuation of of treatment if appropriate.   viral infections are self-limited and we treat each symptom depending of severity.  Over the counter medications as decongestants and cold medications usually help, they need to be taken with caution if there is a history of high blood pressure or palpitations. Tylenol and/or Ibuprofen also helps with most symptoms (headache, muscle aching, fever,etc) Plenty of fluids. Honey helps with cough. Steam inhalations helps with runny nose, nasal congestion, and may prevent sinus infections. Cough and nasal congestion could last a few days and sometimes weeks. Please follow in not any better in 1-2 weeks or if symptoms get worse.     In general please monitor for signs of worsening symptoms and seek immediate medical attention if any concerning.  If symptoms are not resolved in 1-2 weeks you should schedule a follow up appointment with your doctor, before if needed.  I hope you get better soon!

## 2018-11-03 NOTE — Telephone Encounter (Signed)
Ret'd. call to pt.  Reported headache, dizziness, and nausea since Saturday.  Reported she was prescribed a Prednisone Taper for lower back pain, that she took Sunday through Friday.   Questioned if the Prednisone has caused dizziness?  Last dose of Prednisone was on Friday.  Described dizziness as "room is spinning, and feeling that her eyes keep shifting."  Stated her symptoms are continuous. Rated headache at 6/10.  Stated the dizziness is mild, affecting her ability to work at computer.  C/o nausea with feeling she could vomit, but has not vomited. HR is 102/ regular.  Denied fever/ chills.  reported has feeling that she has to open up her shoulder blades to take a deep breath.  Denied chest pain.  The pt. reported she feels like she is catching a cold; c/o chest congestion and sore throat.  Reported she took a Mucinex this AM.  Has an appt. With PCP today.  Care advice given per protocol.  Verb. Understanding.  Advised to go to ER if her symptoms worsen.  Agreed with plan.         Reason for Disposition . [1] MODERATE dizziness (e.g., interferes with normal activities) AND [2] has NOT been evaluated by physician for this  (Exception: dizziness caused by heat exposure, sudden standing, or poor fluid intake)  Answer Assessment - Initial Assessment Questions 1. DESCRIPTION: "Describe your dizziness."     Room I spinning and eyes keep shifting 2. LIGHTHEADED: "Do you feel lightheaded?" (e.g., somewhat faint, woozy, weak upon standing)     Intermittent woozy feeling and feels faint 3. VERTIGO: "Do you feel like either you or the room is spinning or tilting?" (i.e. vertigo)    Room is spinning 4. SEVERITY: "How bad is it?"  "Do you feel like you are going to faint?" "Can you stand and walk?"   - MILD - walking normally   - MODERATE - interferes with normal activities (e.g., work, school)    - SEVERE - unable to stand, requires support to walk, feels like passing out now.      Mild; difficulty looking  at computer; doesn't affect walking/driving 5. ONSET:  "When did the dizziness begin?"     Saturday, 11/9 6. AGGRAVATING FACTORS: "Does anything make it worse?" (e.g., standing, change in head position)     Quick position change  7. HEART RATE: "Can you tell me your heart rate?" "How many beats in 15 seconds?"  (Note: not all patients can do this)       Pulse rate 102 8. CAUSE: "What do you think is causing the dizziness?"    Took Prednisone for 6 days; thought it could cause this 9. RECURRENT SYMPTOM: "Have you had dizziness before?" If so, ask: "When was the last time?" "What happened that time?"     Denied 10. OTHER SYMPTOMS: "Do you have any other symptoms?" (e.g., fever, chest pain, vomiting, diarrhea, bleeding)       Described some difficulty taking a deep breath; feels like she has to open up her shoulder blades to get a deep breath.; denied shortness of breath ; c/o headache @ 6/10; took Tylenol and Excedrin for headache with relief; denied fever/ chills or body aches. C/o nausea, felt like vomiting, but has not vomited; nausea @ 5/10  11. PREGNANCY: "Is there any chance you are pregnant?" "When was your last menstrual period?"       LMP 4 weeks ago; to start today  Protocols used: DIZZINESS [facility]

## 2018-11-26 ENCOUNTER — Other Ambulatory Visit: Payer: Self-pay | Admitting: Family Medicine

## 2018-11-26 DIAGNOSIS — J309 Allergic rhinitis, unspecified: Secondary | ICD-10-CM

## 2018-12-03 DIAGNOSIS — M545 Low back pain, unspecified: Secondary | ICD-10-CM | POA: Insufficient documentation

## 2018-12-14 DIAGNOSIS — M5126 Other intervertebral disc displacement, lumbar region: Secondary | ICD-10-CM | POA: Insufficient documentation

## 2018-12-24 ENCOUNTER — Other Ambulatory Visit: Payer: Self-pay | Admitting: Student

## 2018-12-24 DIAGNOSIS — M5416 Radiculopathy, lumbar region: Secondary | ICD-10-CM

## 2018-12-28 ENCOUNTER — Ambulatory Visit: Payer: Managed Care, Other (non HMO) | Admitting: Family Medicine

## 2018-12-28 ENCOUNTER — Encounter: Payer: Self-pay | Admitting: Family Medicine

## 2018-12-28 VITALS — BP 108/70 | HR 94 | Temp 98.6°F | Wt 205.0 lb

## 2018-12-28 DIAGNOSIS — M5126 Other intervertebral disc displacement, lumbar region: Secondary | ICD-10-CM

## 2018-12-28 DIAGNOSIS — Z9109 Other allergy status, other than to drugs and biological substances: Secondary | ICD-10-CM | POA: Diagnosis not present

## 2018-12-28 DIAGNOSIS — G47419 Narcolepsy without cataplexy: Secondary | ICD-10-CM | POA: Diagnosis not present
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
## 2018-12-28 NOTE — Patient Instructions (Addendum)
Narcolepsy  Narcolepsy is a neurological disorder that causes you to fall asleep suddenly, and without control, during the daytime (sleep attacks). Narcolepsy is a lifelong (chronic) disorder.  Normally, sleep follows a regular cycle over the course of the night. After about 90 minutes of light sleep, your sleep should become deeper. When your sleep becomes deeper, your body moves less and you start dreaming. This type of deep sleep is called rapid eye movement (REM) sleep. When you have narcolepsy, your REM sleep is not well-regulated. This disrupts your sleep cycle, which causes daytime sleepiness.  What are the causes?  The cause of narcolepsy is not fully understood, but it may be related to:  Low levels of hypocretin, a chemical (neurotransmitter) in the brain that controls sleep and wake cycles. Hypocretin imbalance may be caused by:  Abnormal genes that are passed from parent to child (inherited).  The body's defense system (immune system) attacking hypocretin brain cells (autoimmune disease).  Infection, tumor, or injury in the area of the brain that controls sleep.  Exposure to poisons (toxins), such as heavy metals, pesticides, and secondhand smoke.  What are the signs or symptoms?  Symptoms of this condition include:  Excessive daytime sleepiness. This is the most common symptom and is usually the first symptom you will notice. This may affect your performance at work or school.  Sleep attacks. This means falling asleep suddenly and without control. You may fall asleep in the middle of an activity, especially low-energy activities like reading or watching TV.  Feeling like you cannot think clearly.  Trouble focusing or remembering things.  Feeling depressed.  Sudden muscle weakness (cataplexy). When this occurs, your speech may become slurred, or your knees may buckle. Cataplexy is usually triggered by surprise, anger, fear, or laughter.  Loss of the ability to speak or move (sleep paralysis). This may  occur just as you start to fall asleep or wake up. You will be aware of the paralysis. It usually lasts for just a few seconds or minutes.  Seeing, hearing, tasting, smelling, or feeling things that are not real (hallucinations). Hallucinations may occur with sleep paralysis. They can happen when you are falling asleep, waking up, or dozing.  Trouble staying asleep at night (insomnia).  Restless sleep.  How is this diagnosed?  This condition may be diagnosed based on:  A physical exam to rule out any other problems that may be causing your symptoms. You may be asked to write down your sleeping patterns for several weeks in a sleep diary. This will help your health care provider make a diagnosis.  Sleep studies that measure how well your REM sleep is regulated. These tests also measure your heart rate, breathing, movement, and brain waves. These tests include:  An overnight sleep study (polysomnogram).  A daytime sleep study that is done while you take several naps during the day (multiple sleep latency test, MSLT). This test measures how quickly you fall asleep and how quickly you enter REM sleep.  Removal of spinal fluid to measure hypocretin levels.  How is this treated?  There is no cure for this condition, but treatment can help relieve symptoms. Treatment may include:  Lifestyle and sleeping strategies to help you cope with the condition, such as:  Exercising regularly.  Maintaining a regular sleep schedule.  Avoiding caffeine and large meals before bed.  Medicines. These may include:  Medicines that help keep you awake and alert (stimulants) to fight daytime sleepiness.  Medicines that treat depression (antidepressants).   is a strong medicine to help you relax (sedative) that you may take at night. It can help control daytime sleepiness and cataplexy. Follow these instructions at home: Sleeping  habits   Get about 8 hours of sleep every night.  Go to sleep and get up at about the same time every day.  Keep your bedroom dark, quiet, and comfortable.  When you feel very tired, take short naps. Schedule naps so that you take them at about the same time every day.  Tell your employer or teachers that you have narcolepsy. You may be able to adjust your schedule to include time for naps.  Before bedtime: ? Avoid bright lights and screens. ? Relax. Try activities like reading or taking a warm bath. Activity  Get at least 20 minutes of exercise every day. This will help you sleep better at night and reduce daytime sleepiness.  Avoid exercising within 3 hours of bedtime.  If you are sleepy, do not drive or use heavy machinery.  If possible, take a nap before driving.  Do not swim or go out on the water without a life jacket. Eating and drinking  Do not drink alcohol or caffeinated beverages within 4-5 hours of bedtime.  Do not eat a lot of food before bedtime. Eat meals at about the same times every day. General instructions   Take over-the-counter and prescription medicines only as told by your health care provider.  If directed, keep a sleep diary.  Tell your employer or teachers that you have narcolepsy. You may be able to adjust your schedule to include time for naps.  Do not use any products that contain nicotine or tobacco, such as cigarettes and e-cigarettes. If you need help quitting, ask your health care provider.  Keep all follow-up visits as told by your health care provider. This is important. Contact a health care provider if:  Your symptoms are not getting better.  You have increasingly high blood pressure (hypertension).  You have changes in your heart rhythm.  You are having a hard time determining what is real and what is not (psychosis). Get help right away if:  You hurt yourself during a sleep attack or an attack of cataplexy.  You have  chest pain.  You have trouble breathing. This information is not intended to replace advice given to you by your health care provider. Make sure you discuss any questions you have with your health care provider. Document Released: 11/28/2002 Document Revised: 12/01/2016 Document Reviewed: 12/01/2016 Elsevier Interactive Patient Education  2019 [employer]  Herniated Disk  A herniated disk, also called a ruptured disk or slipped disk, occurs when a disk in the spine bulges out too far. Between the bones in the spine (vertebrae), there are oval disks that are made of a soft, spongy center that is surrounded by a tough outer ring. The disks connect your vertebrae, help your spine move, and absorb shocks from your movement. When you have a herniated disk, the spongy center of the disk bulges out or breaks through the outer ring. It can press on a nerve between the vertebrae and cause pain. This can occur anywhere in the back or neck area, but the lower back is most commonly affected. What are the causes? This condition may be caused by:  Age-related wear and tear. The spongy centers of spinal disks tend to shrink and dry out with age, which makes them more likely to herniate.  Sudden injury, such as a strain or sprain.  What increases the risk? Aging is the main risk factor for a herniated disk. Other risk factors include:  Being a man who is 43-26 years old.  Frequently doing activities that involve heavy lifting, bending, or twisting.  Frequently driving for long hours at a time.  Not getting enough exercise.  Being overweight.  Smoking.  Having a family history of back problems or herniated disks.  Being pregnant or giving birth.  Having poor nutrition.  Being tall. What are the signs or symptoms? Symptoms may vary depending on where your herniated disk is located.  A herniated disk in the lower back may cause sharp pain in: ? Part of the arm, leg, hip, or buttocks. ? The  back of the lower leg (calf). ? The lower back, spreading down through the leg into the foot (sciatica).  A herniated disk in the neck may cause dizziness and vertigo. It may also cause pain or weakness in: ? The neck. ? The shoulder blades. ? Upper arm, forearm, or fingers.  You may also have muscle weakness. It may be difficult to: ? Lift your leg or arm. ? Stand on your toes. ? Squeeze tightly with one of your hands.  Other symptoms may include: ? Numbness or tingling in the affected areas of the hands, arms, feet, or legs. ? Inability to control when you urinate or when you have bowel movements. This is a rare but serious sign of a severe herniated disk in the lower back. How is this diagnosed? This condition may be diagnosed based on:  Your symptoms.  Your medical history.  A physical exam. The exam may include: ? Straight-leg test. You will lie on your back while your health care provider lifts your leg, keeping your knee straight. If you feel pain, you likely have a herniated disk. ? Neurological tests. This includes checking for numbness, reflexes, muscle strength, and posture.  Imaging tests, such as: ? X-rays. ? MRI. ? CT scan. ? Electromyogram (EMG) to check the nerves that control muscles. This test may be used to determine which nerves are affected by your herniated disk. How is this treated? Treatment for this condition may include:  A short period of rest. This is usually the first treatment. ? You may be on bed rest for up to 2 days, or you may be instructed to stay home and avoid physical activity. ? If you have a herniated disk in your lower back, avoid sitting as much as possible. Sitting increases pressure on the disk.  Medicines. These may include: ? NSAIDs to help reduce pain and swelling. ? Muscle relaxants to prevent sudden tightening of the back muscles (back spasms). ? Prescription pain medicines, if you have severe pain.  Steroid injections in  the area of the herniated disk. This can help reduce pain and swelling.  Physical therapy to strengthen your back muscles. In many cases, symptoms go away with treatment over a period of days or weeks. You will most likely be free of symptoms after 3-4 months. If other treatments do not help to relieve your symptoms, you may need surgery. Follow these instructions at home: Medicines  Take over-the-counter and prescription medicines only as told by your health care provider.  Do not drive or use heavy machinery while taking prescription pain medicine. Activity  Rest as directed.  After your rest period: ? Return to your normal activities and gradually begin exercising as told by your health care provider. Ask your health care provider what activities and  exercises are safe for you. ? Use good posture. ? Avoid movements that cause pain. ? Do not lift anything that is heavier than 10 lb (4.5 kg) until your health care provider says this is safe. ? Do not sit or stand for long periods of time without changing positions. ? Do not sit for long periods of time without getting up and moving around.  If physical therapy was prescribed, do exercises as instructed.  Aim to strengthen muscles in your back and abdomen with exercises like crunches, swimming, or walking. General instructions  Do not use any products that contain nicotine or tobacco, such as cigarettes and e-cigarettes. These products can delay healing. If you need help quitting, ask your health care provider.  Do not wear high-heeled shoes.  Do not sleep on your belly.  If you are overweight, work with your health care provider to lose weight safely.  To prevent or treat constipation while you are taking prescription pain medicine, your health care provider may recommend that you: ? Drink enough fluid to keep your urine clear or pale yellow. ? Take over-the-counter or prescription medicines. ? Eat foods that are high in fiber,  such as fresh fruits and vegetables, whole grains, and beans. ? Limit foods that are high in fat and processed sugars, such as fried and sweet foods.  Keep all follow-up visits as told by your health care provider. This is important. How is this prevented?   Maintain a healthy weight.  Try to avoid stressful situations.  Maintain physical fitness. Do at least 150 minutes of moderate-intensity exercise each week, such as brisk walking or water aerobics.  When lifting objects: ? Keep your feet at least shoulder-width apart and tighten your abdominal muscles. ? Keep your spine neutral as you bend your knees and hips. It is important to lift using the strength of your legs, not your back. Do not lock your knees straight out. ? Always ask for help to lift heavy or awkward objects. Contact a health care provider if:  You have back pain or neck pain that does not get better after 6 weeks.  You have severe pain in your back, neck, legs, or arms.  You develop numbness, tingling, or weakness in any part of your body. Get help right away if:  You cannot move your arms or legs.  You cannot control when you urinate or have bowel movements.  You feel dizzy or you faint.  You have shortness of breath. This information is not intended to replace advice given to you by your health care provider. Make sure you discuss any questions you have with your health care provider. Document Released: 12/05/2000 Document Revised: 08/04/2016 Document Reviewed: 08/04/2016 Elsevier Interactive Patient Education  [employer].

## 2018-12-30 ENCOUNTER — Ambulatory Visit
Admission: RE | Admit: 2018-12-30 | Discharge: 2018-12-30 | Disposition: A | Discharge status: Home / Self Care | Payer: Managed Care, Other (non HMO) | Source: Ambulatory Visit | Attending: Student | Admitting: Student

## 2018-12-30 DIAGNOSIS — M5416 Radiculopathy, lumbar region: Secondary | ICD-10-CM

## 2018-12-30 MED ORDER — METHYLPREDNISOLONE ACETATE 40 MG/ML INJ SUSP (RADIOLOG
120.0000 mg | Freq: Once | INTRAMUSCULAR | Status: AC
  Administered 2018-12-30: 120 mg via EPIDURAL

## 2018-12-30 MED ORDER — IOPAMIDOL (ISOVUE-M 200) INJECTION 41%
1.0000 mL | Freq: Once | INTRAMUSCULAR | Status: AC
  Administered 2018-12-30: 1 mL via EPIDURAL

## 2018-12-30 NOTE — Discharge Instructions (Signed)

## 2019-01-03 ENCOUNTER — Other Ambulatory Visit: Payer: Managed Care, Other (non HMO)

## 2019-02-09 ENCOUNTER — Ambulatory Visit: Payer: Managed Care, Other (non HMO) | Admitting: Family Medicine

## 2019-02-09 ENCOUNTER — Encounter: Payer: Self-pay | Admitting: Family Medicine

## 2019-02-09 VITALS — BP 110/64 | HR 101 | Temp 98.4°F | Wt 202.5 lb

## 2019-02-09 DIAGNOSIS — R05 Cough: Secondary | ICD-10-CM | POA: Diagnosis not present

## 2019-02-09 DIAGNOSIS — R059 Cough, unspecified: Secondary | ICD-10-CM

## 2019-02-09 LAB — POC INFLUENZA A&B (BINAX/QUICKVUE)
INFLUENZA B, POC: NEGATIVE
Influenza A, POC: NEGATIVE

## 2019-02-09 MED ORDER — METHYLPREDNISOLONE ACETATE 80 MG/ML IJ SUSP
80.0000 mg | Freq: Once | INTRAMUSCULAR | Status: AC
  Administered 2019-02-09: 80 mg via INTRAMUSCULAR

## 2019-02-09 MED ORDER — DOXYCYCLINE HYCLATE 100 MG PO TABS: 100.0000 mg | ORAL_TABLET | Freq: Two times a day (BID) | ORAL | 0 refills | Status: AC

## 2019-02-09 MED ORDER — E-Z SPACER DEVI: 2 refills | Status: DC

## 2019-02-09 NOTE — Progress Notes (Signed)
Lori Mann DOB: July 22, 1991 Encounter date: 02/09/2019  This is a 28 y.o. female who presents with Chief Complaint  Patient presents with  . Sore Throat    x 2 week, started with sore throat, no voice last week, dry cough, head congestion, body aches this am,     History of present illness:  Started over 2 weeks ago - sore throat, itchy throat. Hard to get rid of throat sx. Coughing through weekend. Lost voice Monday through Monday. Had a little congestion. Just really run down. Then past weekend started back with itch in throat, congestion, ears feel full. Still with dry cough. Now just hurts to cough. Sore in neck and upper chest.   Just feels miserable.   Husband had flu last week.    No asthma hx but did have inhaler after pneumonia in past. Does not typically get sinus infections.   (of note; back pain is still present. Getting injections at this point but not noted improvement in pain sx yet; following with specialist)  Allergies  Allergen Reactions  . Nickel Rash    Burning rash  . Benzoyl Peroxide Rash   Current Meds  Medication Sig  . desloratadine (CLARINEX) 5 MG tablet Take 5 mg by mouth at bedtime.  . drospirenone-ethinyl estradiol (YAZ,GIANVI,LORYNA) 3-0.02 MG tablet Take 1 tablet by mouth daily.  Marland Kitchen EPINEPHrine (EPIPEN 2-PAK) 0.3 mg/0.3 mL IJ SOAJ injection Inject 0.3 mg into the muscle once as needed (allergic reaction).   Marland Kitchen levocetirizine (XYZAL) 5 MG tablet Take 5 mg by mouth daily.  . methylphenidate (RITALIN) 5 MG tablet Take 1 tablet (5 mg total) by mouth 2 (two) times daily. (Patient taking differently: Take 5 mg by mouth 2 (two) times daily as needed (Narcolepsy). )  . Olopatadine HCl 0.2 % SOLN olopatadine 0.2 % eye drops  INSTILL ONE DROP INTO BOTH EYES EVERY DAY    Review of Systems  Constitutional: Positive for appetite change (decreased), chills and fatigue.  HENT: Positive for congestion, sinus pressure and sore throat. Negative for trouble  swallowing.   Respiratory: Positive for cough. Negative for wheezing. Shortness of breath: not getting full breath.   Musculoskeletal: Positive for arthralgias and myalgias.    Objective:  BP 110/64 (BP Location: Left Arm, Patient Position: Sitting, Cuff Size: Normal)   Pulse (!) 101   Temp 98.4 F (36.9 C) (Oral)   Wt 202 lb 8 oz (91.9 kg)   SpO2 98%   BMI 32.68 kg/m   Weight: 202 lb 8 oz (91.9 kg)   BP Readings from Last 3 Encounters:  02/09/19 110/64  12/30/18 120/69  12/28/18 108/70   Wt Readings from Last 3 Encounters:  02/09/19 202 lb 8 oz (91.9 kg)  12/28/18 205 lb (93 kg)  11/03/18 202 lb 6 oz (91.8 kg)    Physical Exam Constitutional:      General: She is not in acute distress.    Appearance: She is well-developed.  HENT:     Head: Normocephalic and atraumatic.     Right Ear: Tympanic membrane, ear canal and external ear normal.     Left Ear: Tympanic membrane, ear canal and external ear normal.     Nose: Mucosal edema present.     Right Sinus: Maxillary sinus tenderness and frontal sinus tenderness present.     Left Sinus: Maxillary sinus tenderness and frontal sinus tenderness present.     Mouth/Throat:     Mouth: Mucous membranes are moist.     Pharynx:  Uvula midline. Posterior oropharyngeal erythema present. No oropharyngeal exudate.  Cardiovascular:     Rate and Rhythm: Normal rate and regular rhythm.     Heart sounds: Murmur present. Systolic murmur present with a grade of 2/6.  Pulmonary:     Effort: Pulmonary effort is normal. No respiratory distress.     Breath sounds: Decreased breath sounds (at bases bilat) present. No wheezing, rhonchi or rales.     Assessment/Plan 1. Cough Suspect some post infectious cough but also with worsening congestion and sinus tenderness following 2 weeks of sick symptoms suspect secondary bacterial sinus infection. Encouraged albuterol BID for next few days. Let us know if not improving by end of week or if any  worsening.  Flu testing negative today, but if fever spikes would consider treatment due to exposure from husband. - methylPREDNISolone acetate (DEPO-MEDROL) injection 80 mg - Spacer/Aero-Holding Chambers (E-Z SPACER) inhaler; Use as instructed  Dispense: 1 each; Refill: 2 - doxycycline (VIBRA-TABS) 100 MG tablet; Take 1 tablet (100 mg total) by mouth 2 (two) times daily for 7 days.  Dispense: 14 tablet; Refill: 0 - POC Influenza A&B(BINAX/QUICKVUE)   Return if symptoms worsen or fail to improve.    Theodis Shove, MD

## 2019-10-25 ENCOUNTER — Other Ambulatory Visit: Payer: Self-pay

## 2019-10-25 DIAGNOSIS — Z20822 Contact with and (suspected) exposure to covid-19: Secondary | ICD-10-CM

## 2019-10-27 LAB — NOVEL CORONAVIRUS, NAA: SARS-CoV-2, NAA: NOT DETECTED

## 2019-11-07 ENCOUNTER — Other Ambulatory Visit: Payer: Self-pay | Admitting: Physical Medicine and Rehabilitation

## 2019-11-07 DIAGNOSIS — M5136 Other intervertebral disc degeneration, lumbar region: Secondary | ICD-10-CM

## 2019-11-23 ENCOUNTER — Other Ambulatory Visit: Payer: Managed Care, Other (non HMO)

## 2019-11-23 ENCOUNTER — Other Ambulatory Visit: Payer: Self-pay

## 2019-11-24 ENCOUNTER — Ambulatory Visit: Payer: Managed Care, Other (non HMO) | Admitting: Family Medicine

## 2019-11-24 ENCOUNTER — Encounter: Payer: Self-pay | Admitting: Family Medicine

## 2019-11-24 ENCOUNTER — Other Ambulatory Visit: Payer: Self-pay

## 2019-11-24 VITALS — BP 110/70 | HR 94 | Temp 97.6°F | Wt 212.0 lb

## 2019-11-24 DIAGNOSIS — L03032 Cellulitis of left toe: Secondary | ICD-10-CM | POA: Diagnosis not present

## 2019-11-24 DIAGNOSIS — L600 Ingrowing nail: Secondary | ICD-10-CM

## 2019-11-24 DIAGNOSIS — F329 Major depressive disorder, single episode, unspecified: Secondary | ICD-10-CM

## 2019-11-24 DIAGNOSIS — F419 Anxiety disorder, unspecified: Secondary | ICD-10-CM | POA: Diagnosis not present

## 2019-11-24 MED ORDER — SULFAMETHOXAZOLE-TRIMETHOPRIM 800-160 MG PO TABS: 1.0000 | ORAL_TABLET | Freq: Two times a day (BID) | ORAL | 0 refills | Status: AC

## 2019-11-24 MED ORDER — SERTRALINE HCL 25 MG PO TABS: 25.0000 mg | ORAL_TABLET | Freq: Every day | ORAL | 1 refills | Status: DC

## 2019-11-24 NOTE — Patient Instructions (Signed)
Ingrown Toenail An ingrown toenail occurs when the corner or sides of a toenail grow into the surrounding skin. This causes discomfort and pain. The big toe is most commonly affected, but any of the toes can be affected. If an ingrown toenail is not treated, it can become infected. What are the causes? This condition may be caused by:  Wearing shoes that are too small or tight.  An injury, such as stubbing your toe or having your toe stepped on.  Improper cutting or care of your toenails.  Having nail or foot abnormalities that were present from birth (congenital abnormalities), such as having a nail that is too big for your toe. What increases the risk? The following factors may make you more likely to develop ingrown toenails:  Age. Nails tend to get thicker with age, so ingrown nails are more common among older people.  Cutting your toenails incorrectly, such as cutting them very short or cutting them unevenly. An ingrown toenail is more likely to get infected if you have:  Diabetes.  Blood flow (circulation) problems. What are the signs or symptoms? Symptoms of an ingrown toenail may include:  Pain, soreness, or tenderness.  Redness.  Swelling.  Hardening of the skin that surrounds the toenail. Signs that an ingrown toenail may be infected include:  Fluid or pus.  Symptoms that get worse instead of better. How is this diagnosed? An ingrown toenail may be diagnosed based on your medical history, your symptoms, and a physical exam. If you have fluid or blood coming from your toenail, a sample may be collected to test for the specific type of bacteria that is causing the infection. How is this treated? Treatment depends on how severe your ingrown toenail is. You may be able to care for your toenail at home.  If you have an infection, you may be prescribed antibiotic medicines.  If you have fluid or pus draining from your toenail, your health care provider may drain it.   If you have trouble walking, you may be given crutches to use.  If you have a severe or infected ingrown toenail, you may need a procedure to remove part or all of the nail. Follow these instructions at home: Foot care   Do not pick at your toenail or try to remove it yourself.  Soak your foot in warm, soapy water. Do this for 20 minutes, 3 times a day, or as often as told by your health care provider. This helps to keep your toe clean and keep your skin soft.  Wear shoes that fit well and are not too tight. Your health care provider may recommend that you wear open-toed shoes while you heal.  Trim your toenails regularly and carefully. Cut your toenails straight across to prevent injury to the skin at the corners of the toenail. Do not cut your nails in a curved shape.  Keep your feet clean and dry to help prevent infection. Medicines  Take over-the-counter and prescription medicines only as told by your health care provider.  If you were prescribed an antibiotic, take it as told by your health care provider. Do not stop taking the antibiotic even if you start to feel better. Activity  Return to your normal activities as told by your health care provider. Ask your health care provider what activities are safe for you.  Avoid activities that cause pain. General instructions  If your health care provider told you to use crutches to help you move around, use them   as instructed.  Keep all follow-up visits as told by your health care provider. This is important. Contact a health care provider if:  You have more redness, swelling, pain, or other symptoms that do not improve with treatment.  You have fluid, blood, or pus coming from your toenail. Get help right away if:  You have a red streak on your skin that starts at your foot and spreads up your leg.  You have a fever. Summary  An ingrown toenail occurs when the corner or sides of a toenail grow into the surrounding skin.  This causes discomfort and pain. The big toe is most commonly affected, but any of the toes can be affected.  If an ingrown toenail is not treated, it can become infected.  Fluid or pus draining from your toenail is a sign of infection. Your health care provider may need to drain it. You may be given antibiotics to treat the infection.  Trimming your toenails regularly and properly can help you prevent an ingrown toenail. This information is not intended to replace advice given to you by your health care provider. Make sure you discuss any questions you have with your health care provider. Document Released: 12/05/2000 Document Revised: 04/01/2019 Document Reviewed: 08/26/2017 Elsevier Patient Education  2020 [employer]  Cellulitis, Adult  Cellulitis is a skin infection. The infected area is often warm, red, swollen, and sore. It occurs most often in the arms and lower legs. It is very important to get treated for this condition. What are the causes? This condition is caused by bacteria. The bacteria enter through a break in the skin, such as a cut, burn, insect bite, open sore, or crack. What increases the risk? This condition is more likely to occur in people who:  Have a weak body defense system (immune system).  Have open cuts, burns, bites, or scrapes on the skin.  Are older than 30 years of age.  Have a blood sugar problem (diabetes).  Have a long-lasting (chronic) liver disease (cirrhosis) or kidney disease.  Are very overweight (obese).  Have a skin problem, such as: ? Itchy rash (eczema). ? Slow movement of blood in the veins (venous stasis). ? Fluid buildup below the skin (edema).  Have been treated with high-energy rays (radiation).  Use IV drugs. What are the signs or symptoms? Symptoms of this condition include:  Skin that is: ? Red. ? Streaking. ? Spotting. ? Swollen. ? Sore or painful when you touch it. ? Warm.  A fever.  Chills.  Blisters.  How is this diagnosed? This condition is diagnosed based on:  Medical history.  Physical exam.  Blood tests.  Imaging tests. How is this treated? Treatment for this condition may include:  Medicines to treat infections or allergies.  Home care, such as: ? Rest. ? Placing cold or warm cloths (compresses) on the skin.  Hospital care, if the condition is very bad. Follow these instructions at home: Medicines  Take over-the-counter and prescription medicines only as told by your doctor.  If you were prescribed an antibiotic medicine, take it as told by your doctor. Do not stop taking it even if you start to feel better. General instructions   Drink enough fluid to keep your pee (urine) pale yellow.  Do not touch or rub the infected area.  Raise (elevate) the infected area above the level of your heart while you are sitting or lying down.  Place cold or warm cloths on the area as told by  your doctor.  Keep all follow-up visits as told by your doctor. This is important. Contact a doctor if:  You have a fever.  You do not start to get better after 1-2 days of treatment.  Your bone or joint under the infected area starts to hurt after the skin has healed.  Your infection comes back. This can happen in the same area or another area.  You have a swollen bump in the area.  You have new symptoms.  You feel ill and have muscle aches and pains. Get help right away if:  Your symptoms get worse.  You feel very sleepy.  You throw up (vomit) or have watery poop (diarrhea) for a long time.  You see red streaks coming from the area.  Your red area gets larger.  Your red area turns dark in color. These symptoms may represent a serious problem that is an emergency. Do not wait to see if the symptoms will go away. Get medical help right away. Call your local emergency services (911 in the [place] Do not drive yourself to the hospital. Summary  Cellulitis is a skin  infection. The area is often warm, red, swollen, and sore.  This condition is treated with medicines, rest, and cold and warm cloths.  Take all medicines only as told by your doctor.  Tell your doctor if symptoms do not start to get better after 1-2 days of treatment. This information is not intended to replace advice given to you by your health care provider. Make sure you discuss any questions you have with your health care provider. Document Released: 05/26/2008 Document Revised: 04/29/2018 Document Reviewed: 04/29/2018 Elsevier Patient Education  2020 [employer]  Living With Anxiety  After being diagnosed with an anxiety disorder, you may be relieved to know why you have felt or behaved a certain way. It is natural to also feel overwhelmed about the treatment ahead and what it will mean for your life. With care and support, you can manage this condition and recover from it. How to cope with anxiety Dealing with stress Stress is your body's reaction to life changes and events, both good and bad. Stress can last just a few hours or it can be ongoing. Stress can play a major role in anxiety, so it is important to learn both how to cope with stress and how to think about it differently. Talk with your health care provider or a counselor to learn more about stress reduction. He or she may suggest some stress reduction techniques, such as:  Music therapy. This can include creating or listening to music that you enjoy and that inspires you.  Mindfulness-based meditation. This involves being aware of your normal breaths, rather than trying to control your breathing. It can be done while sitting or walking.  Centering prayer. This is a kind of meditation that involves focusing on a word, phrase, or sacred image that is meaningful to you and that brings you peace.  Deep breathing. To do this, expand your stomach and inhale slowly through your nose. Hold your breath for 3-5 seconds. Then exhale  slowly, allowing your stomach muscles to relax.  Self-talk. This is a skill where you identify thought patterns that lead to anxiety reactions and correct those thoughts.  Muscle relaxation. This involves tensing muscles then relaxing them. Choose a stress reduction technique that fits your lifestyle and personality. Stress reduction techniques take time and practice. Set aside 5-15 minutes a day to do them. Therapists can offer  training in these techniques. The training may be covered by some insurance plans. Other things you can do to manage stress include:  Keeping a stress diary. This can help you learn what triggers your stress and ways to control your response.  Thinking about how you respond to certain situations. You may not be able to control everything, but you can control your reaction.  Making time for activities that help you relax, and not feeling guilty about spending your time in this way. Therapy combined with coping and stress-reduction skills provides the best chance for successful treatment. Medicines Medicines can help ease symptoms. Medicines for anxiety include:  Anti-anxiety drugs.  Antidepressants.  Beta-blockers. Medicines may be used as the main treatment for anxiety disorder, along with therapy, or if other treatments are not working. Medicines should be prescribed by a health care provider. Relationships Relationships can play a big part in helping you recover. Try to spend more time connecting with trusted friends and family members. Consider going to couples counseling, taking family education classes, or going to family therapy. Therapy can help you and others better understand the condition. How to recognize changes in your condition Everyone has a different response to treatment for anxiety. Recovery from anxiety happens when symptoms decrease and stop interfering with your daily activities at home or work. This may mean that you will start to:  Have  better concentration and focus.  Sleep better.  Be less irritable.  Have more energy.  Have improved memory. It is important to recognize when your condition is getting worse. Contact your health care provider if your symptoms interfere with home or work and you do not feel like your condition is improving. Where to find help and support: You can get help and support from these sources:  Self-help groups.  Online and [employer].  A trusted spiritual leader.  Couples counseling.  Family education classes.  Family therapy. Follow these instructions at home:  Eat a healthy diet that includes plenty of vegetables, fruits, whole grains, low-fat dairy products, and lean protein. Do not eat a lot of foods that are high in solid fats, added sugars, or salt.  Exercise. Most adults should do the following: ? Exercise for at least 150 minutes each week. The exercise should increase your heart rate and make you sweat (moderate-intensity exercise). ? Strengthening exercises at least twice a week.  Cut down on caffeine, tobacco, alcohol, and other potentially harmful substances.  Get the right amount and quality of sleep. Most adults need 7-9 hours of sleep each night.  Make choices that simplify your life.  Take over-the-counter and prescription medicines only as told by your health care provider.  Avoid caffeine, alcohol, and certain over-the-counter cold medicines. These may make you feel worse. Ask your pharmacist which medicines to avoid.  Keep all follow-up visits as told by your health care provider. This is important. Questions to ask your health care provider  Would I benefit from therapy?  How often should I follow up with a health care provider?  How long do I need to take medicine?  Are there any long-term side effects of my medicine?  Are there any alternatives to taking medicine? Contact a health care provider if:  You have a hard time staying  focused or finishing daily tasks.  You spend many hours a day feeling worried about everyday life.  You become exhausted by worry.  You start to have headaches, feel tense, or have nausea.  You urinate more  than normal.  You have diarrhea. Get help right away if:  You have a racing heart and shortness of breath.  You have thoughts of hurting yourself or others. If you ever feel like you may hurt yourself or others, or have thoughts about taking your own life, get help right away. You can go to your nearest emergency department or call:  Your local emergency services (911 in the [place]  A suicide crisis helpline, such as the [facility] at (573) 175-6222. This is open 24-hours a day. Summary  Taking steps to deal with stress can help calm you.  Medicines cannot cure anxiety disorders, but they can help ease symptoms.  Family, friends, and partners can play a big part in helping you recover from an anxiety disorder. This information is not intended to replace advice given to you by your health care provider. Make sure you discuss any questions you have with your health care provider. Document Released: 12/02/2016 Document Revised: 11/20/2017 Document Reviewed: 12/02/2016 Elsevier Patient Education  2020 [place] With Depression Everyone experiences occasional disappointment, sadness, and loss in their lives. When you are feeling down, blue, or sad for at least 2 weeks in a row, it may mean that you have depression. Depression can affect your thoughts and feelings, relationships, daily activities, and physical health. It is caused by changes in the way your brain functions. If you receive a diagnosis of depression, your health care provider will tell you which type of depression you have and what treatment options are available to you. If you are living with depression, there are ways to help you recover from it and also ways to prevent it from  coming back. How to cope with lifestyle changes Coping with stress     Stress is your body's reaction to life changes and events, both good and bad. Stressful situations may include:  Getting married.  The death of a spouse.  Losing a job.  Retiring.  Having a baby. Stress can last just a few hours or it can be ongoing. Stress can play a major role in depression, so it is important to learn both how to cope with stress and how to think about it differently. Talk with your health care provider or a counselor if you would like to learn more about stress reduction. He or she may suggest some stress reduction techniques, such as:  Music therapy. This can include creating music or listening to music. Choose music that you enjoy and that inspires you.  Mindfulness-based meditation. This kind of meditation can be done while sitting or walking. It involves being aware of your normal breaths, rather than trying to control your breathing.  Centering prayer. This is a kind of meditation that involves focusing on a spiritual word or phrase. Choose a word, phrase, or sacred image that is meaningful to you and that brings you peace.  Deep breathing. To do this, expand your stomach and inhale slowly through your nose. Hold your breath for 3-5 seconds, then exhale slowly, allowing your stomach muscles to relax.  Muscle relaxation. This involves intentionally tensing muscles then relaxing them. Choose a stress reduction technique that fits your lifestyle and personality. Stress reduction techniques take time and practice to develop. Set aside 5-15 minutes a day to do them. Therapists can offer training in these techniques. The training may be covered by some insurance plans. Other things you can do to manage stress include:  Keeping a stress diary. This can help  you learn what triggers your stress and ways to control your response.  Understanding what your limits are and saying no to requests or  events that lead to a schedule that is too full.  Thinking about how you respond to certain situations. You may not be able to control everything, but you can control how you react.  Adding humor to your life by watching funny films or TV shows.  Making time for activities that help you relax and not feeling guilty about spending your time this way.  Medicines Your health care provider may suggest certain medicines if he or she feels that they will help improve your condition. Avoid using alcohol and other substances that may prevent your medicines from working properly (may interact). It is also important to:  Talk with your pharmacist or health care provider about all the medicines that you take, their possible side effects, and what medicines are safe to take together.  Make it your goal to take part in all treatment decisions (shared decision-making). This includes giving input on the side effects of medicines. It is best if shared decision-making with your health care provider is part of your total treatment plan. If your health care provider prescribes a medicine, you may not notice the full benefits of it for 4-8 weeks. Most people who are treated for depression need to be on medicine for at least 6-12 months after they feel better. If you are taking medicines as part of your treatment, do not stop taking medicines without first talking to your health care provider. You may need to have the medicine slowly decreased (tapered) over time to decrease the risk of harmful side effects. Relationships Your health care provider may suggest family therapy along with individual therapy and drug therapy. While there may not be family problems that are causing you to feel depressed, it is still important to make sure your family learns as much as they can about your mental health. Having your family's support can help make your treatment successful. How to recognize changes in your condition Everyone  has a different response to treatment for depression. Recovery from major depression happens when you have not had signs of major depression for two months. This may mean that you will start to:  Have more interest in doing activities.  Feel less hopeless than you did 2 months ago.  Have more energy.  Overeat less often, or have better or improving appetite.  Have better concentration. Your health care provider will work with you to decide the next steps in your recovery. It is also important to recognize when your condition is getting worse. Watch for these signs:  Having fatigue or low energy.  Eating too much or too little.  Sleeping too much or too little.  Feeling restless, agitated, or hopeless.  Having trouble concentrating or making decisions.  Having unexplained physical complaints.  Feeling irritable, angry, or aggressive. Get help as soon as you or your family members notice these symptoms coming back. How to get support and help from others How to talk with friends and family members about your condition  Talking to friends and family members about your condition can provide you with one way to get support and guidance. Reach out to trusted friends or family members, explain your symptoms to them, and let them know that you are working with a health care provider to treat your depression. Financial resources Not all insurance plans cover mental health care, so it is important to  check with your insurance carrier. If paying for co-pays or counseling services is a problem, search for a local or county mental health care center. They may be able to offer public mental health care services at low or no cost when you are not able to see a private health care provider. If you are taking medicine for depression, you may be able to get the generic form, which may be less expensive. Some makers of prescription medicines also offer help to patients who cannot afford the medicines  they need. Follow these instructions at home:   Get the right amount and quality of sleep.  Cut down on using caffeine, tobacco, alcohol, and other potentially harmful substances.  Try to exercise, such as walking or lifting small weights.  Take over-the-counter and prescription medicines only as told by your health care provider.  Eat a healthy diet that includes plenty of vegetables, fruits, whole grains, low-fat dairy products, and lean protein. Do not eat a lot of foods that are high in solid fats, added sugars, or salt.  Keep all follow-up visits as told by your health care provider. This is important. Contact a health care provider if:  You stop taking your antidepressant medicines, and you have any of these symptoms: ? Nausea. ? Headache. ? Feeling lightheaded. ? Chills and body aches. ? Not being able to sleep (insomnia).  You or your friends and family think your depression is getting worse. Get help right away if:  You have thoughts of hurting yourself or others. If you ever feel like you may hurt yourself or others, or have thoughts about taking your own life, get help right away. You can go to your nearest emergency department or call:  Your local emergency services (911 in the [place]  A suicide crisis helpline, such as the National Suicide Prevention Lifeline at (470)036-6376. This is open 24-hours a day. Summary  If you are living with depression, there are ways to help you recover from it and also ways to prevent it from coming back.  Work with your health care team to create a management plan that includes counseling, stress management techniques, and healthy lifestyle habits. This information is not intended to replace advice given to you by your health care provider. Make sure you discuss any questions you have with your health care provider. Document Released: 11/10/2016 Document Revised: 04/01/2019 Document Reviewed: 11/10/2016 Elsevier Patient Education   2020 [employer].

## 2019-11-24 NOTE — Progress Notes (Signed)
Subjective:    Patient ID: Lori Mann, female    DOB: Apr 24, 1991, 28 y.o.   MRN: [id number]  No chief complaint on file.   HPI Patient was seen today for f/u.  Pt notes L great toe pain with purulent drainage x a few wks.  Erythema on side of toe has not spread.  Denies fever, n/v, induration, edema.  Pt cut the edge of her toenail after noticing the pain.  Concerned it may be an fungal infection s/p pedicure.  Pt also notes increased anxiety/worrying.  In counseling.  Medication suggested.  In the past was on lexapro 10 mg x 1 yr.  Not sure if helped.  Endorses difficulty sleeping, decreased energy, feeling down, difficulty relaxing, and irritable.  Denies SI/HI.  Pt has family members on 86.  Past Medical History:  Diagnosis Date  . Allergy   . H/O infectious mononucleosis   . IBS (irritable bowel syndrome)   . Kidney stones   . Lumbar herniated disc   . Narcolepsy without cataplexy(347.00)     Allergies  Allergen Reactions  . Nickel Rash    Burning rash  . Benzoyl Peroxide Rash    ROS General: Denies fever, chills, night sweats, changes in weight, changes in appetite HEENT: Denies headaches, ear pain, changes in vision, rhinorrhea, sore throat CV: Denies CP, palpitations, SOB, orthopnea Pulm: Denies SOB, cough, wheezing GI: Denies abdominal pain, nausea, vomiting, diarrhea, constipation GU: Denies dysuria, hematuria, frequency, vaginal discharge Msk: Denies muscle cramps, joint pains  +L great toe pain Neuro: Denies weakness, numbness, tingling Skin: Denies rashes, bruising Psych: Denies hallucinations  +depression and anxiety    Objective:    Blood pressure 110/70, pulse 94, temperature 97.6 F (36.4 C), temperature source Temporal, weight 212 lb (96.2 kg), SpO2 97 %.  Gen. Pleasant, well-nourished, in no distress, normal affect   HEENT: [place]/AT, face symmetric, no scleral icterus, PERRLA, nares patent without drainage. Lungs: no accessory muscle use, CTAB, no  wheezes or rales Cardiovascular: RRR, no m/r/g, no peripheral edema Neuro:  A&Ox3, CN II-XII intact, normal gait Skin:  Warm, no lesions/ rash.  Hallux nail of L great toe with erythema along medial edge of lateral nail fold.  No purulent drainage, increased warmth, or induration noted.  Wt Readings from Last 3 Encounters:  11/24/19 212 lb (96.2 kg)  02/09/19 202 lb 8 oz (91.9 kg)  12/28/18 205 lb (93 kg)    Lab Results  Component Value Date   WBC 8.0 06/21/2018   HGB 13.2 06/21/2018   HCT 40.3 06/21/2018   PLT 359 06/21/2018   GLUCOSE 117 (H) 06/21/2018   CHOL 157 06/26/2015   TRIG 183.0 (H) 06/26/2015   HDL 46.60 06/26/2015   LDLCALC 74 06/26/2015   ALT 21 01/29/2017   AST 16 01/29/2017   NA 137 06/21/2018   K 4.4 06/21/2018   CL 103 06/21/2018   CREATININE 0.77 06/21/2018   BUN 8 06/21/2018   CO2 26 06/21/2018   TSH 3.06 01/29/2017    Assessment/Plan:  Ingrown toenail -discussed options to relieve discomfort.  Including epsom salt, etc -discussed removal for continued symptoms -given handout  Cellulitis of toe of left foot  -given handout - Plan: sulfamethoxazole-trimethoprim (BACTRIM DS) 800-160 MG tablet  Anxiety and depression  -PHQ 9 score 12 -GAD 7 score 17 -encouraged to continue counseling  -discussed possible s/e -given precautions -f/u in 4-6 wks - Plan: sertraline (ZOLOFT) 25 MG tablet  F/u in 4-6 wks, sooner if needed.  Johnathon Mittal, MD 

## 2019-11-27 ENCOUNTER — Encounter: Payer: Self-pay | Admitting: Family Medicine

## 2019-12-14 ENCOUNTER — Ambulatory Visit
Admission: RE | Admit: 2019-12-14 | Discharge: 2019-12-14 | Disposition: A | Discharge status: Home / Self Care | Payer: Managed Care, Other (non HMO) | Source: Ambulatory Visit | Attending: Physical Medicine and Rehabilitation | Admitting: Physical Medicine and Rehabilitation

## 2019-12-14 DIAGNOSIS — M5136 Other intervertebral disc degeneration, lumbar region: Secondary | ICD-10-CM

## 2019-12-17 ENCOUNTER — Other Ambulatory Visit: Payer: Self-pay | Admitting: Family Medicine

## 2019-12-17 DIAGNOSIS — F419 Anxiety disorder, unspecified: Secondary | ICD-10-CM

## 2019-12-17 DIAGNOSIS — F329 Major depressive disorder, single episode, unspecified: Secondary | ICD-10-CM

## 2020-01-04 ENCOUNTER — Other Ambulatory Visit: Payer: Self-pay

## 2020-01-05 ENCOUNTER — Ambulatory Visit: Payer: Managed Care, Other (non HMO) | Admitting: Family Medicine

## 2020-01-05 ENCOUNTER — Encounter: Payer: Self-pay | Admitting: Family Medicine

## 2020-01-05 VITALS — BP 110/80 | HR 88 | Temp 97.8°F | Wt 209.0 lb

## 2020-01-05 DIAGNOSIS — M545 Low back pain, unspecified: Secondary | ICD-10-CM

## 2020-01-05 DIAGNOSIS — M5126 Other intervertebral disc displacement, lumbar region: Secondary | ICD-10-CM | POA: Diagnosis not present

## 2020-01-05 DIAGNOSIS — F419 Anxiety disorder, unspecified: Secondary | ICD-10-CM

## 2020-01-05 DIAGNOSIS — F329 Major depressive disorder, single episode, unspecified: Secondary | ICD-10-CM

## 2020-01-05 DIAGNOSIS — M5136 Other intervertebral disc degeneration, lumbar region: Secondary | ICD-10-CM

## 2020-01-05 DIAGNOSIS — G8929 Other chronic pain: Secondary | ICD-10-CM

## 2020-01-05 MED ORDER — LIDO-CAPSAICIN-MEN-METHYL SAL 0.5-0.035-5-20 % EX PTCH: 1.0000 | MEDICATED_PATCH | Freq: Every day | CUTANEOUS | 1 refills | Status: DC | PRN

## 2020-01-05 MED ORDER — CELECOXIB 100 MG PO CAPS: 100.0000 mg | ORAL_CAPSULE | Freq: Two times a day (BID) | ORAL | 1 refills | Status: DC

## 2020-01-09 ENCOUNTER — Encounter: Payer: Self-pay | Admitting: Family Medicine

## 2020-01-09 NOTE — Progress Notes (Signed)
Subjective:    Patient ID: Lori Mann, female    DOB: 28-Aug-1991, 29 y.o.   MRN: [id number]  No chief complaint on file.   HPI Patient was seen today for f/u.  Since last OFV, pt had a discogram at [facility].  Advised to have a spinal fusion. Pt does not wish to have surgery if there is another option.  She has tried injections, PT, chiropractor, tramadol, yoga, gabapentin.  Currently taking gabapentin 300 mg nightly.  Pain at baseline 5/10 will increase to 85/46 with certain movements.  Pt started on zoloft 25 mg daily for anxiety and depression at last [facility] 11/24/19.  Notes some improvement in symptoms.  Current issues with back not helping.  In counseling.  In the past was on lexapro 10 mg, unsure it helped.     Past Medical History:  Diagnosis Date  . Allergy   . H/O infectious mononucleosis   . IBS (irritable bowel syndrome)   . Kidney stones   . Lumbar herniated disc   . Narcolepsy without cataplexy(347.00)     Allergies  Allergen Reactions  . Nickel Rash    Burning rash  . Benzoyl Peroxide Rash    ROS General: Denies fever, chills, night sweats, changes in weight, changes in appetite HEENT: Denies headaches, ear pain, changes in vision, rhinorrhea, sore throat CV: Denies CP, palpitations, SOB, orthopnea Pulm: Denies SOB, cough, wheezing GI: Denies abdominal pain, nausea, vomiting, diarrhea, constipation GU: Denies dysuria, hematuria, frequency, vaginal discharge Msk: Denies muscle cramps, joint pains  +Low back pain Neuro: Denies weakness, numbness, tingling Skin: Denies rashes, bruising Psych: Denies hallucinations  +anxiety and depression    Objective:    Blood pressure 110/80, pulse 88, temperature 97.8 F (36.6 C), temperature source Temporal, weight 209 lb (94.8 kg), SpO2 97 %.  Gen. Pleasant, well-nourished, in no distress, normal affect   HEENT: [place]/AT, face symmetric, no scleral icterus, PERRLA, nares patent without  drainage Lungs: no accessory muscle use, CTAB, no wheezes or rales Cardiovascular: RRR, no m/r/g, no peripheral edema Musculoskeletal: No deformities, no cyanosis or clubbing, normal tone Neuro:  A&Ox3, CN II-XII intact, normal gait Skin:  Warm, no lesions/ rash  Wt Readings from Last 3 Encounters:  01/05/20 209 lb (94.8 kg)  11/24/19 212 lb (96.2 kg)  02/09/19 202 lb 8 oz (91.9 kg)    Lab Results  Component Value Date   WBC 8.0 06/21/2018   HGB 13.2 06/21/2018   HCT 40.3 06/21/2018   PLT 359 06/21/2018   GLUCOSE 117 (H) 06/21/2018   CHOL 157 06/26/2015   TRIG 183.0 (H) 06/26/2015   HDL 46.60 06/26/2015   LDLCALC 74 06/26/2015   ALT 21 01/29/2017   AST 16 01/29/2017   NA 137 06/21/2018   K 4.4 06/21/2018   CL 103 06/21/2018   CREATININE 0.77 06/21/2018   BUN 8 06/21/2018   CO2 26 06/21/2018   TSH 3.06 01/29/2017    Assessment/Plan:  Anxiety and depression -stable -PHQ 9 score 8.  Was 12 on 11/24/19 -GAD 7 score 14.  Was 17 on 11/24/19 -continue zoloft 25 mg. -continue counseling. -will continue to monitor.  Chronic midline low back pain without sciatica  -initial injury 10/06/2018 immediate pain after bending down -disc herniation L4-5 per past MRI -discussed remaining options for pain relief.  Pt does not want to be on opioid pain meds. -will try lidocaine patches and celecoxib.   -Given precautions -will place referral to Neurosurg for 2nd  opinion. - Plan: celecoxib (CELEBREX) 100 MG capsule, Lido-Capsaicin-Men-Methyl Sal 0.5-0.035-5-20 % PTCH, Ambulatory referral to Neurosurgery  Bulging lumbar disc  -initial injury 10/06/18 - Plan: Ambulatory referral to Neurosurgery  F/u in 1-2 months, sooner if needed  Abbe Amsterdam, MD

## 2020-01-23 ENCOUNTER — Other Ambulatory Visit: Payer: Self-pay

## 2020-01-23 ENCOUNTER — Ambulatory Visit (INDEPENDENT_AMBULATORY_CARE_PROVIDER_SITE_OTHER): Payer: Managed Care, Other (non HMO) | Admitting: Otolaryngology

## 2020-01-23 ENCOUNTER — Encounter (INDEPENDENT_AMBULATORY_CARE_PROVIDER_SITE_OTHER): Payer: Self-pay | Admitting: Otolaryngology

## 2020-01-23 VITALS — Temp 97.3°F

## 2020-01-23 DIAGNOSIS — H6981 Other specified disorders of Eustachian tube, right ear: Secondary | ICD-10-CM

## 2020-01-23 NOTE — Progress Notes (Signed)
HPI: Lori Mann is a 29 y.o. female who presents for evaluation of fullness in her right ear that she has had for several weeks.  She has been using Flonase intermittently.  Apparently this caused nosebleeds in the past.  She is status post septoplasty and turbinate reductions performed in 2015 by myself.  She also had tonsillectomy at that time. She is not complaining of hearing problems but more just fullness in the right ear.  She feels like it has to pop.  Past Medical History:  Diagnosis Date  . Allergy   . H/O infectious mononucleosis   . IBS (irritable bowel syndrome)   . Kidney stones   . Lumbar herniated disc   . Narcolepsy without cataplexy(347.00)    Past Surgical History:  Procedure Laterality Date  . CYSTOSCOPY WITH RETROGRADE PYELOGRAM, URETEROSCOPY AND STENT PLACEMENT Right 10/11/2015   Procedure: CYSTOSCOPY WITH RETROGRADE PYELOGRAM, URETEROSCOPY,STONE EXTRACTIONAND STENT PLACEMENT;  Surgeon: Franchot Gallo, MD;  Location: [facility];  Service: Urology;  Laterality: Right;  . HOLMIUM LASER APPLICATION Right 60/63/0160   Procedure: HOLMIUM LASER APPLICATION;  Surgeon: Franchot Gallo, MD;  Location: [facility];  Service: Urology;  Laterality: Right;  . NASAL SEPTUM SURGERY    . TONSILLECTOMY     Social History   Socioeconomic History  . Marital status: Single    Spouse name: Not on file  . Number of children: Not on file  . Years of education: Not on file  . Highest education level: Not on file  Occupational History  . Not on file  Tobacco Use  . Smoking status: Never Smoker  . Smokeless tobacco: Never Used  Substance and Sexual Activity  . Alcohol use: Yes    Alcohol/week: 0.0 standard drinks  . Drug use: No  . Sexual activity: Yes    Birth control/protection: Pill  Other Topics Concern  . Not on file  Social History Narrative  . Not on file   Social Determinants of Health   Financial Resource Strain:   .  Difficulty of Paying Living Expenses: Not on file  Food Insecurity:   . Worried About Charity fundraiser in the Last Year: Not on file  . Ran Out of Food in the Last Year: Not on file  Transportation Needs:   . Lack of Transportation (Medical): Not on file  . Lack of Transportation (Non-Medical): Not on file  Physical Activity:   . Days of Exercise per Week: Not on file  . Minutes of Exercise per Session: Not on file  Stress:   . Feeling of Stress : Not on file  Social Connections:   . Frequency of Communication with Friends and Family: Not on file  . Frequency of Social Gatherings with Friends and Family: Not on file  . Attends Religious Services: Not on file  . Active Member of Clubs or Organizations: Not on file  . Attends Archivist Meetings: Not on file  . Marital Status: Not on file   Family History  Problem Relation Age of Onset  . Cancer Mother        breast ca  . Arthritis Father   . Hyperlipidemia Father   . Irritable bowel syndrome Sister    Allergies  Allergen Reactions  . Nickel Rash    Burning rash  . Benzoyl Peroxide Rash   Prior to Admission medications   Medication Sig Start Date End Date Taking? Authorizing Provider  celecoxib (CELEBREX) 100 MG capsule Take 1  capsule (100 mg total) by mouth 2 (two) times daily. 01/05/20  Yes Deeann Saint, MD  desloratadine (CLARINEX) 5 MG tablet Take 5 mg by mouth at bedtime.   Yes [provider]  drospirenone-ethinyl estradiol (YAZ,GIANVI,LORYNA) 3-0.02 MG tablet Take 1 tablet by mouth daily.   Yes [provider]  EPINEPHrine (EPIPEN 2-PAK) 0.3 mg/0.3 mL IJ SOAJ injection Inject 0.3 mg into the muscle once as needed (allergic reaction).    Yes [provider]  levocetirizine (XYZAL) 5 MG tablet Take 5 mg by mouth daily.   Yes [provider]  Lido-Capsaicin-Men-Methyl Sal 0.5-0.035-5-20 % PTCH Apply 1 patch topically daily as needed. 01/05/20  Yes Deeann Saint, MD   methylphenidate (RITALIN) 5 MG tablet Take 1 tablet (5 mg total) by mouth 2 (two) times daily. Patient taking differently: Take 5 mg by mouth 2 (two) times daily as needed (Narcolepsy).  10/13/16  Yes Sood, Laurier Nancy, MD  Olopatadine HCl 0.2 % SOLN olopatadine 0.2 % eye drops  INSTILL ONE DROP INTO BOTH EYES EVERY DAY   Yes [provider]  sertraline (ZOLOFT) 25 MG tablet TAKE 1 TABLET BY MOUTH EVERY DAY 12/19/19  Yes Deeann Saint, MD  Spacer/Aero-Holding Chambers (E-Z SPACER) inhaler Use as instructed 02/09/19  Yes Koberlein, Junell C, MD     Positive ROS: Otherwise negative  All other systems have been reviewed and were otherwise negative with the exception of those mentioned in the HPI and as above.  Physical Exam: Constitutional: Alert, well-appearing, no acute distress Ears: External ears without lesions or tenderness. Ear canals are clear bilaterally with intact, clear TMs bilaterally.  Specifically the right ear is clear with good mobility pneumatic otoscopy.  On hearing screening with a tuning fork she heard about the same in both ears with AC > BC bilaterally.  I was able to insufflate air behind the right TM in the office today without difficulty. Nasal: External nose without lesions. Septum relatively midline with mild rhinitis.. Clear nasal passages otherwise. Oral: Lips and gums without lesions. Tongue and palate mucosa without lesions. Posterior oropharynx clear.  Patient status post tonsillectomy. Neck: No palpable adenopathy or masses.  No TMJ abnormality noted on palpation. Respiratory: Breathing comfortably  Skin: No facial/neck lesions or rash noted.  Procedures  Assessment: Questionable eustachian tube dysfunction  Plan: Recommended regular use of nasal steroid spray and prescribed Nasacort 2 sprays each nostril at night.  If symptoms persist gave a prescription for Augmentin 875 mg twice daily for 10 days. She will follow-up if she notes any hearing  problems.  Narda Bonds, MD

## 2020-03-05 ENCOUNTER — Other Ambulatory Visit: Payer: Self-pay | Admitting: Family Medicine

## 2020-03-05 DIAGNOSIS — G8929 Other chronic pain: Secondary | ICD-10-CM

## 2020-03-05 NOTE — Telephone Encounter (Signed)
Pt is scheduled for a virtual visit for med refill on 03/07/2019 at  4 pm with Dr Salomon Fick

## 2020-03-06 ENCOUNTER — Telehealth: Payer: Managed Care, Other (non HMO) | Admitting: Family Medicine

## 2020-03-07 ENCOUNTER — Telehealth (INDEPENDENT_AMBULATORY_CARE_PROVIDER_SITE_OTHER): Payer: Managed Care, Other (non HMO) | Admitting: Family Medicine

## 2020-03-07 DIAGNOSIS — M47816 Spondylosis without myelopathy or radiculopathy, lumbar region: Secondary | ICD-10-CM

## 2020-03-07 DIAGNOSIS — M5136 Other intervertebral disc degeneration, lumbar region: Secondary | ICD-10-CM | POA: Diagnosis not present

## 2020-03-07 DIAGNOSIS — Z7189 Other specified counseling: Secondary | ICD-10-CM | POA: Diagnosis not present

## 2020-03-07 DIAGNOSIS — M545 Low back pain, unspecified: Secondary | ICD-10-CM

## 2020-03-07 DIAGNOSIS — G8929 Other chronic pain: Secondary | ICD-10-CM

## 2020-03-07 DIAGNOSIS — M51369 Other intervertebral disc degeneration, lumbar region without mention of lumbar back pain or lower extremity pain: Secondary | ICD-10-CM

## 2020-03-07 MED ORDER — GABAPENTIN 300 MG PO CAPS: 300.0000 mg | ORAL_CAPSULE | Freq: Every day | ORAL | 3 refills | Status: DC

## 2020-03-07 MED ORDER — CELECOXIB 100 MG PO CAPS: 100.0000 mg | ORAL_CAPSULE | Freq: Every day | ORAL | 3 refills | Status: DC

## 2020-03-07 NOTE — Progress Notes (Signed)
Virtual Visit via Video Note  I connected with Lori Mann on 03/07/20 at  4:30 PM EDT by a video enabled telemedicine application 2/2 [id number] pandemic and verified that I am speaking with the correct person using two identifiers.  Location patient: home Location provider:work or home office Persons participating in the virtual visit: patient, provider  I discussed the limitations of evaluation and management by telemedicine and the availability of in person appointments. The patient expressed understanding and agreed to proceed.   HPI: Pt had 2nd opinion for back pain/degenerative lumbar disc with Dr. Lavell Anchors.  Pt advised spinal fusion surgery not indicated at this time.  It was felt that the pain signals from her brain to her back could be blocked via a neuromodulator type device.  Pt taking gabapentin 300 mg qhs and Celebrex 100 mg qhs.  Gabapentin was rx'd by [facility].  Pt expresses concerns about long term celebrex use.  In the past she has tried yoga, tramadol, PT, chiropractor.    Pt had her 1st COVID shot on Monday.  She developed fever, chills, fatigue.  Still recovering.  ROS: See pertinent positives and negatives per HPI.  Past Medical History:  Diagnosis Date  . Allergy   . H/O infectious mononucleosis   . IBS (irritable bowel syndrome)   . Kidney stones   . Lumbar herniated disc   . Narcolepsy without cataplexy(347.00)     Past Surgical History:  Procedure Laterality Date  . CYSTOSCOPY WITH RETROGRADE PYELOGRAM, URETEROSCOPY AND STENT PLACEMENT Right 10/11/2015   Procedure: CYSTOSCOPY WITH RETROGRADE PYELOGRAM, URETEROSCOPY,STONE EXTRACTIONAND STENT PLACEMENT;  Surgeon: Franchot Gallo, MD;  Location[facility];  Service: Urology;  Laterality: Right;  . HOLMIUM LASER APPLICATION Right 10/62/6948   Procedure: HOLMIUM LASER APPLICATION;  Surgeon: Franchot Gallo, MD;  Location: [facility];  Service: Urology;  Laterality: Right;   . NASAL SEPTUM SURGERY    . TONSILLECTOMY      Family History  Problem Relation Age of Onset  . Cancer Mother        breast ca  . Arthritis Father   . Hyperlipidemia Father   . Irritable bowel syndrome Sister      Current Outpatient Medications:  .  celecoxib (CELEBREX) 100 MG capsule, Take 1 capsule (100 mg total) by mouth 2 (two) times daily., Disp: 60 capsule, Rfl: 1 .  desloratadine (CLARINEX) 5 MG tablet, Take 5 mg by mouth at bedtime., Disp: , Rfl:  .  drospirenone-ethinyl estradiol (YAZ,GIANVI,LORYNA) 3-0.02 MG tablet, Take 1 tablet by mouth daily., Disp: , Rfl:  .  EPINEPHrine (EPIPEN 2-PAK) 0.3 mg/0.3 mL IJ SOAJ injection, Inject 0.3 mg into the muscle once as needed (allergic reaction). , Disp: , Rfl:  .  levocetirizine (XYZAL) 5 MG tablet, Take 5 mg by mouth daily., Disp: , Rfl:  .  Lido-Capsaicin-Men-Methyl Sal 0.5-0.035-5-20 % PTCH, Apply 1 patch topically daily as needed., Disp: 30 patch, Rfl: 1 .  methylphenidate (RITALIN) 5 MG tablet, Take 1 tablet (5 mg total) by mouth 2 (two) times daily. (Patient taking differently: Take 5 mg by mouth 2 (two) times daily as needed (Narcolepsy). ), Disp: 60 tablet, Rfl: 0 .  Olopatadine HCl 0.2 % SOLN, olopatadine 0.2 % eye drops  INSTILL ONE DROP INTO BOTH EYES EVERY DAY, Disp: , Rfl:  .  sertraline (ZOLOFT) 25 MG tablet, TAKE 1 TABLET BY MOUTH EVERY DAY, Disp: 90 tablet, Rfl: 0 .  Spacer/Aero-Holding Chambers (E-Z SPACER) inhaler, Use as instructed, Disp:  1 each, Rfl: 2  EXAM:  VITALS per patient if applicable:  RR between 12-20 bpm  GENERAL: alert, oriented, appears well and in no acute distress  HEENT: atraumatic, conjunctiva clear, no obvious abnormalities on inspection of external nose and ears  NECK: normal movements of the head and neck  LUNGS: on inspection no signs of respiratory distress, breathing rate appears normal, no obvious gross SOB, gasping or wheezing  CV: no obvious cyanosis  MS: moves all visible  extremities without noticeable abnormality  PSYCH/NEURO: pleasant and cooperative, no obvious depression or anxiety, speech and thought processing grossly intact  ASSESSMENT AND PLAN:  Discussed the following assessment and plan:  Disc degeneration, lumbar -spinal fusion surgery not indicated at this time  - Plan: celecoxib (CELEBREX) 100 MG capsule  Chronic midline low back pain without sciatica  -discussed water aerobics and acupuncture as options - Plan: gabapentin (NEURONTIN) 300 MG capsule, celecoxib (CELEBREX) 100 MG capsule  Lumbar spondylosis  Educated about COVID-19 virus infection -developed symptoms s/p 1st COVID shot -discussed supportive care -continue to monitor  F/u prn   I discussed the assessment and treatment plan with the patient. The patient was provided an opportunity to ask questions and all were answered. The patient agreed with the plan and demonstrated an understanding of the instructions.   The patient was advised to call back or seek an in-person evaluation if the symptoms worsen or if the condition fails to improve as anticipated.   Deeann Saint, MD

## 2020-03-13 ENCOUNTER — Other Ambulatory Visit: Payer: Self-pay | Admitting: Family Medicine

## 2020-03-13 DIAGNOSIS — F419 Anxiety disorder, unspecified: Secondary | ICD-10-CM

## 2020-03-13 DIAGNOSIS — F329 Major depressive disorder, single episode, unspecified: Secondary | ICD-10-CM

## 2020-05-18 ENCOUNTER — Other Ambulatory Visit: Payer: Self-pay | Admitting: Family Medicine

## 2020-05-18 DIAGNOSIS — F419 Anxiety disorder, unspecified: Secondary | ICD-10-CM

## 2020-05-18 DIAGNOSIS — F32A Depression, unspecified: Secondary | ICD-10-CM

## 2020-05-18 NOTE — Telephone Encounter (Signed)
Pt LOV was 03/07/2020 and Last refill was on 03/13/2020 for 90 tablets, please advise if ok to send refills

## 2020-12-05 ENCOUNTER — Other Ambulatory Visit: Payer: Self-pay | Admitting: Family Medicine

## 2020-12-05 DIAGNOSIS — F32A Depression, unspecified: Secondary | ICD-10-CM

## 2020-12-05 DIAGNOSIS — F419 Anxiety disorder, unspecified: Secondary | ICD-10-CM

## 2020-12-11 NOTE — Telephone Encounter (Signed)
Refill request for   Sertraline 25 mg LR 05/19/20, #90. 1 rf LOV 03/07/20  FOV  None scheduled.  Please review and advise.  Thanks.  Dm/cma

## 2021-01-14 DIAGNOSIS — H6991 Unspecified Eustachian tube disorder, right ear: Secondary | ICD-10-CM | POA: Insufficient documentation

## 2021-03-09 ENCOUNTER — Other Ambulatory Visit: Payer: Self-pay | Admitting: Family Medicine

## 2021-03-09 DIAGNOSIS — M545 Low back pain, unspecified: Secondary | ICD-10-CM

## 2021-03-09 DIAGNOSIS — G8929 Other chronic pain: Secondary | ICD-10-CM

## 2021-03-09 DIAGNOSIS — M5136 Other intervertebral disc degeneration, lumbar region: Secondary | ICD-10-CM

## 2021-04-20 ENCOUNTER — Other Ambulatory Visit: Payer: Self-pay | Admitting: Family Medicine

## 2021-04-20 DIAGNOSIS — G8929 Other chronic pain: Secondary | ICD-10-CM

## 2021-04-20 DIAGNOSIS — M5136 Other intervertebral disc degeneration, lumbar region: Secondary | ICD-10-CM

## 2021-05-29 ENCOUNTER — Ambulatory Visit (INDEPENDENT_AMBULATORY_CARE_PROVIDER_SITE_OTHER): Payer: Managed Care, Other (non HMO) | Admitting: Pulmonary Disease

## 2021-05-29 ENCOUNTER — Encounter: Payer: Self-pay | Admitting: Pulmonary Disease

## 2021-05-29 ENCOUNTER — Other Ambulatory Visit: Payer: Self-pay

## 2021-05-29 VITALS — BP 116/80 | HR 107 | Temp 98.2°F | Ht 65.5 in | Wt 209.4 lb

## 2021-05-29 DIAGNOSIS — G4719 Other hypersomnia: Secondary | ICD-10-CM | POA: Diagnosis not present

## 2021-05-29 MED ORDER — METHYLPHENIDATE HCL 5 MG PO TABS: 5.0000 mg | ORAL_TABLET | Freq: Two times a day (BID) | ORAL | 0 refills | Status: DC

## 2021-05-29 NOTE — Progress Notes (Signed)
Lori Mann    [id number]    March 08, 1991  Primary Care Physician:Banks, Bettey Mare, MD  Referring Physician: Deeann Saint, MD [address] [place],  [place] [address]  Chief complaint:   Patient with a history of idiopathic hypersomnolence  HPI:  Idiopathic hypersomnolence Was on Ritalin in the past-this worked well  Was off Ritalin the last couple of years because she is been working from home  Started going back to work and noticed significant fatigue and tiredness, sleepiness during the day  Previous study revealed mild obstructive sleep apnea MSLT showed idiopathic hypersomnolence with 1 out of 5 sleep onset REM periods  Tried different medications but Ridling was the only 1 she was able to tolerate and worked well  No significant changes in her health Usually goes to bed between 10 and 11 PM Takes up to 50 minutes to fall asleep Usually sleeps through the night Usually wakes up about 8:45 AM Weight is up about 20 pounds-attributes this to being on Zoloft-recently  Outpatient Encounter Medications as of 05/29/2021  Medication Sig  . albuterol (VENTOLIN HFA) 108 (90 Base) MCG/ACT inhaler 2 PUFFS INHALATION EVERY 4-6 HRS AS NEEDED COUGH/WHEEZE/1/2 HOUR PRIOR TO EXERCISE 90 DAYS  . celecoxib (CELEBREX) 100 MG capsule TAKE 1 CAPSULE BY MOUTH EVERY DAY  . desloratadine (CLARINEX) 5 MG tablet Take 5 mg by mouth at bedtime.  . drospirenone-ethinyl estradiol (YAZ,GIANVI,LORYNA) 3-0.02 MG tablet Take 1 tablet by mouth daily.  Marland Kitchen EPINEPHrine 0.3 mg/0.3 mL IJ SOAJ injection Inject 0.3 mg into the muscle once as needed (allergic reaction).   Marland Kitchen levocetirizine (XYZAL) 5 MG tablet Take 5 mg by mouth daily.  . Olopatadine HCl 0.2 % SOLN olopatadine 0.2 % eye drops  INSTILL ONE DROP INTO BOTH EYES EVERY DAY  . sertraline (ZOLOFT) 25 MG tablet TAKE 1 TABLET BY MOUTH EVERY DAY  . Spacer/Aero-Holding Chambers (E-Z SPACER) inhaler Use as instructed  . gabapentin  (NEURONTIN) 300 MG capsule Take 1 capsule (300 mg total) by mouth at bedtime. (Patient not taking: Reported on 05/29/2021)  . Lido-Capsaicin-Men-Methyl Sal 0.5-0.035-5-20 % PTCH Apply 1 patch topically daily as needed. (Patient not taking: Reported on 05/29/2021)  . methylphenidate (RITALIN) 5 MG tablet Take 1 tablet (5 mg total) by mouth 2 (two) times daily. (Patient not taking: Reported on 05/29/2021)   No facility-administered encounter medications on file as of 05/29/2021.    Allergies as of 05/29/2021 - Review Complete 05/29/2021  Allergen Reaction Noted  . Nickel Rash 06/21/2018  . Benzoyl peroxide Rash 06/21/2018    Past Medical History:  Diagnosis Date  . Allergic rhinitis   . Allergy   . Asthma   . H/O infectious mononucleosis   . IBS (irritable bowel syndrome)   . Kidney stones   . Lumbar herniated disc   . Narcolepsy without cataplexy(347.00)     Past Surgical History:  Procedure Laterality Date  . CYSTOSCOPY WITH RETROGRADE PYELOGRAM, URETEROSCOPY AND STENT PLACEMENT Right 10/11/2015   Procedure: CYSTOSCOPY WITH RETROGRADE PYELOGRAM, URETEROSCOPY,STONE EXTRACTIONAND STENT PLACEMENT;  Surgeon: Marcine Matar, MD;  Location: [facility];  Service: Urology;  Laterality: Right;  . HOLMIUM LASER APPLICATION Right 10/11/2015   Procedure: HOLMIUM LASER APPLICATION;  Surgeon: Marcine Matar, MD;  Location: [facility];  Service: Urology;  Laterality: Right;  . NASAL SEPTUM SURGERY    . TONSILLECTOMY      Family History  Problem Relation Age of Onset  .  Cancer Mother        breast ca  . Arthritis Father   . Hyperlipidemia Father   . Irritable bowel syndrome Sister     Social History   Socioeconomic History  . Marital status: Married    Spouse name: Not on file  . Number of children: Not on file  . Years of education: Not on file  . Highest education level: Not on file  Occupational History  . Occupation: Chief Financial Officer  Tobacco Use  .  Smoking status: Never Smoker  . Smokeless tobacco: Never Used  Vaping Use  . Vaping Use: Never used  Substance and Sexual Activity  . Alcohol use: Yes    Alcohol/week: 0.0 standard drinks  . Drug use: No  . Sexual activity: Yes    Birth control/protection: Pill  Other Topics Concern  . Not on file  Social History Narrative   Lives with husband   Social Determinants of Health   Financial Resource Strain: Not on file  Food Insecurity: Not on file  Transportation Needs: Not on file  Physical Activity: Not on file  Stress: Not on file  Social Connections: Not on file  Intimate Partner Violence: Not on file    Review of Systems  Constitutional: Negative for fatigue.  Respiratory: Negative for cough and shortness of breath.   Psychiatric/Behavioral: Negative for sleep disturbance.    Vitals:   05/29/21 1143  BP: 116/80  Pulse: (!) 107  Temp: 98.2 F (36.8 C)  SpO2: 98%   Physical Exam Constitutional:      Appearance: She is obese.  HENT:     Head: Normocephalic and atraumatic.     Nose: No congestion or rhinorrhea.     Mouth/Throat:     Mouth: Mucous membranes are moist.  Eyes:     Pupils: Pupils are equal, round, and reactive to light.  Cardiovascular:     Rate and Rhythm: Normal rate and regular rhythm.     Heart sounds: No murmur heard. No friction rub.  Pulmonary:     Effort: No respiratory distress.     Breath sounds: No stridor. No wheezing or rhonchi.  Musculoskeletal:     Cervical back: No rigidity or tenderness.  Neurological:     Mental Status: She is alert.  Psychiatric:        Mood and Affect: Mood normal.   Epworth Sleepiness Scale of 13  Results of the Epworth flowsheet 05/29/2021  Sitting and reading 3  Watching TV 2  Sitting, inactive in a public place (e.g. a theatre or a meeting) 0  As a passenger in a car for an hour without a break 3  Lying down to rest in the afternoon when circumstances permit 3  Sitting and talking to someone 0   Sitting quietly after a lunch without alcohol 2  In a car, while stopped for a few minutes in traffic 0  Total score 13    Data Reviewed: Sleep study from 05/28/2016 reviewed  Previous notes from Dr. Craige Cotta reviewed  Assessment:  Idiopathic hypersomnolence  Daytime sleepiness  Obesity  Plan/Recommendations: Reinitiate Ritalin  Encouraged weight loss measures  Risk of worsening sleep disordered breathing discussed with the patient if weight continues to climb   Virl Diamond MD  Pulmonary and Critical Care 05/29/2021, 12:03 PM  CC: Deeann Saint, MD

## 2021-05-29 NOTE — Patient Instructions (Signed)
Idiopathic hypersomnolence  Prescription for Ritalin 5 mg twice a day sent to pharmacy for you -Make sure you call before you run out as we cannot prescribe with refills  Follow-up in 6 months

## 2021-06-10 ENCOUNTER — Ambulatory Visit (INDEPENDENT_AMBULATORY_CARE_PROVIDER_SITE_OTHER): Payer: Managed Care, Other (non HMO) | Admitting: Family Medicine

## 2021-06-10 ENCOUNTER — Other Ambulatory Visit: Payer: Self-pay

## 2021-06-10 ENCOUNTER — Encounter: Payer: Self-pay | Admitting: Family Medicine

## 2021-06-10 VITALS — BP 118/80 | HR 93 | Temp 98.4°F | Ht 66.0 in | Wt 210.4 lb

## 2021-06-10 DIAGNOSIS — E6609 Other obesity due to excess calories: Secondary | ICD-10-CM

## 2021-06-10 DIAGNOSIS — F419 Anxiety disorder, unspecified: Secondary | ICD-10-CM

## 2021-06-10 DIAGNOSIS — Z6833 Body mass index (BMI) 33.0-33.9, adult: Secondary | ICD-10-CM | POA: Diagnosis not present

## 2021-06-10 DIAGNOSIS — Z13220 Encounter for screening for lipoid disorders: Secondary | ICD-10-CM

## 2021-06-10 DIAGNOSIS — Z0000 Encounter for general adult medical examination without abnormal findings: Secondary | ICD-10-CM | POA: Diagnosis not present

## 2021-06-10 DIAGNOSIS — R635 Abnormal weight gain: Secondary | ICD-10-CM

## 2021-06-10 DIAGNOSIS — Z1159 Encounter for screening for other viral diseases: Secondary | ICD-10-CM

## 2021-06-10 LAB — CBC WITH DIFFERENTIAL/PLATELET
Basophils Absolute: 0.1 10*3/uL (ref 0.0–0.1)
Basophils Relative: 0.7 % (ref 0.0–3.0)
Eosinophils Absolute: 0.6 10*3/uL (ref 0.0–0.7)
Eosinophils Relative: 7.2 % — ABNORMAL HIGH (ref 0.0–5.0)
HCT: 38.2 % (ref 36.0–46.0)
Hemoglobin: 13.1 g/dL (ref 12.0–15.0)
Lymphocytes Relative: 40.5 % (ref 12.0–46.0)
Lymphs Abs: 3.1 10*3/uL (ref 0.7–4.0)
MCHC: 34.3 g/dL (ref 30.0–36.0)
MCV: 87.9 fl (ref 78.0–100.0)
Monocytes Absolute: 0.3 10*3/uL (ref 0.1–1.0)
Monocytes Relative: 3.9 % (ref 3.0–12.0)
Neutro Abs: 3.6 10*3/uL (ref 1.4–7.7)
Neutrophils Relative %: 47.7 % (ref 43.0–77.0)
Platelets: 338 10*3/uL (ref 150.0–400.0)
RBC: 4.34 Mil/uL (ref 3.87–5.11)
RDW: 12.7 % (ref 11.5–15.5)
WBC: 7.6 10*3/uL (ref 4.0–10.5)

## 2021-06-10 LAB — VITAMIN D 25 HYDROXY (VIT D DEFICIENCY, FRACTURES): VITD: 22.46 ng/mL — ABNORMAL LOW (ref 30.00–100.00)

## 2021-06-10 LAB — BASIC METABOLIC PANEL
BUN: 13 mg/dL (ref 6–23)
CO2: 24 mEq/L (ref 19–32)
Calcium: 9.2 mg/dL (ref 8.4–10.5)
Chloride: 105 mEq/L (ref 96–112)
Creatinine, Ser: 0.73 mg/dL (ref 0.40–1.20)
GFR: 108.96 mL/min (ref >60.00)
Glucose, Bld: 103 mg/dL — ABNORMAL HIGH (ref 70–99)
Potassium: 4.7 mEq/L (ref 3.5–5.1)
Sodium: 138 mEq/L (ref 135–145)

## 2021-06-10 LAB — LIPID PANEL
Cholesterol: 174 mg/dL (ref 0–200)
HDL: 51.3 mg/dL (ref >39.00)
NonHDL: 122.75
Total CHOL/HDL Ratio: 3
Triglycerides: 254 mg/dL — ABNORMAL HIGH (ref 0.0–149.0)
VLDL: 50.8 mg/dL — ABNORMAL HIGH (ref 0.0–40.0)

## 2021-06-10 LAB — HEMOGLOBIN A1C: Hgb A1c MFr Bld: 5.6 % (ref 4.6–6.5)

## 2021-06-10 LAB — LDL CHOLESTEROL, DIRECT: Direct LDL: 94 mg/dL

## 2021-06-10 LAB — TSH: TSH: 2.99 u[IU]/mL (ref 0.35–4.50)

## 2021-06-10 LAB — T4, FREE: Free T4: 0.69 ng/dL (ref 0.60–1.60)

## 2021-06-10 NOTE — Progress Notes (Signed)
Subjective:     Lori Mann is a 30 y.o. female and is here for a comprehensive physical exam. Pt doing well. Working from home which she enjoys.  Walking and doing an exercise program that combines weights, zumba, and other exercises.  Drinking at least 80 oz of water per day.  Eating dinner around 9 pm and in bed around 11 pm.  Notes increased anxiety as she and her husband are talking about becoming pregnant.  Taking a PNV.  Also on Zoloft 25 mg daily.  Social History   Socioeconomic History   Marital status: Married    Spouse name: Not on file   Number of children: Not on file   Years of education: Not on file   Highest education level: Not on file  Occupational History   Occupation: Marketing  Tobacco Use   Smoking status: Never   Smokeless tobacco: Never  Vaping Use   Vaping Use: Never used  Substance and Sexual Activity   Alcohol use: Yes    Alcohol/week: 0.0 standard drinks   Drug use: No   Sexual activity: Yes    Birth control/protection: Pill  Other Topics Concern   Not on file  Social History Narrative   Lives with husband   Social Determinants of Health   Financial Resource Strain: Not on file  Food Insecurity: Not on file  Transportation Needs: Not on file  Physical Activity: Not on file  Stress: Not on file  Social Connections: Not on file  Intimate Partner Violence: Not on file   Health Maintenance  Topic Date Due   HIV Screening  Never done   Hepatitis C Screening  Never done   TETANUS/TDAP  06/21/2017   PAP SMEAR-Modifier  06/10/2021 (Originally 03/23/2015)   INFLUENZA VACCINE  07/22/2021   HPV VACCINES  Completed   COVID-19 Vaccine  Completed   Pneumococcal Vaccine 24-20 Years old  Aged Out    The following portions of the patient's history were reviewed and updated as appropriate: allergies, current medications, past family history, past medical history, past social history, past surgical history, and problem list.  Review of Systems Pertinent  items noted in HPI and remainder of comprehensive ROS otherwise negative.   Objective:    BP 118/80 (BP Location: Left Arm, Patient Position: Sitting, Cuff Size: Normal)   Pulse 93   Temp 98.4 F (36.9 C) (Oral)   Ht 5\' 6"  (1.676 m)   Wt 210 lb 6.4 oz (95.4 kg)   LMP 05/31/2021   SpO2 97%   BMI 33.96 kg/m  General appearance: alert, cooperative, and no distress Head: Normocephalic, without obvious abnormality, atraumatic Eyes: conjunctivae/corneas clear. PERRL, EOM's intact. Fundi benign. Ears: normal TM's and external ear canals both ears Nose: Nares normal. Septum midline. Mucosa normal. No drainage or sinus tenderness. Throat: lips, mucosa, and tongue normal; teeth and gums normal Neck: no adenopathy, no carotid bruit, no JVD, supple, symmetrical, trachea midline, and thyroid not enlarged, symmetric, no tenderness/mass/nodules Lungs: clear to auscultation bilaterally Heart: regular rate and rhythm, S1, S2 normal, no murmur, click, rub or gallop Abdomen: soft, non-tender; bowel sounds normal; no masses,  no organomegaly Extremities: extremities normal, atraumatic, no cyanosis or edema Pulses: 2+ and symmetric Skin: Skin color, texture, turgor normal. No rashes or lesions Lymph nodes: Cervical, supraclavicular, and axillary nodes normal. Neurologic: Alert and oriented X 3, normal strength and tone. Normal symmetric reflexes. Normal coordination and gait    Assessment:    Healthy female exam.  Plan:    Anticipatory guidance given including wearing seatbelts, smoke detectors in the home, increasing physical activity, increasing p.o. intake of water and vegetables. -obtain labs -pap up to date, done 10/13/2019 -as considering pregnancy, advised to continue PNV.  Given handout -given handout -next CPE in 1 yr See After Visit Summary for Counseling Recommendations   Weight gain  -BMI 33.96 -Previously 192lbs on 01/29/2017, currently 210.4 lbs -continue lifestyle  modifications and increasing physical activity - Plan: TSH, T4, Free, Hemoglobin A1c  Screening for cholesterol level  - Plan: Lipid panel  Class 1 obesity due to excess calories without serious comorbidity with body mass index (BMI) of 33.0 to 33.9 in adult  -BMI 33.96 - Plan: TSH, T4, Free, Hemoglobin A1c, Lipid panel, Vitamin D, 25-hydroxy  Anxiety  -PHQ 9 score 11 -GAD 7 score 15 -consider counseling -discussed self care and ways to reduce stress. -continue zoloft 25 mg daily.  Consider dose adjustment if needed for increased anxiety - Plan: TSH, T4, Free  Encounter for hepatitis C screening test for low risk patient  - Plan: Hep C Antibody  F/u prn in the next few months  Abbe Amsterdam, MD

## 2021-06-11 LAB — HEPATITIS C ANTIBODY
Hepatitis C Ab: NONREACTIVE
SIGNAL TO CUT-OFF: 0 (ref <1.00)

## 2021-06-12 ENCOUNTER — Other Ambulatory Visit: Payer: Self-pay | Admitting: Family Medicine

## 2021-06-12 DIAGNOSIS — E559 Vitamin D deficiency, unspecified: Secondary | ICD-10-CM

## 2021-06-12 MED ORDER — VITAMIN D (ERGOCALCIFEROL) 1.25 MG (50000 UNIT) PO CAPS: 50000.0000 [IU] | ORAL_CAPSULE | ORAL | 0 refills | Status: DC

## 2021-06-19 ENCOUNTER — Other Ambulatory Visit: Payer: Self-pay | Admitting: Family Medicine

## 2021-06-19 DIAGNOSIS — F419 Anxiety disorder, unspecified: Secondary | ICD-10-CM

## 2021-06-22 ENCOUNTER — Other Ambulatory Visit: Payer: Self-pay | Admitting: Family Medicine

## 2021-06-22 DIAGNOSIS — G8929 Other chronic pain: Secondary | ICD-10-CM

## 2021-06-22 DIAGNOSIS — M5136 Other intervertebral disc degeneration, lumbar region: Secondary | ICD-10-CM

## 2021-08-29 ENCOUNTER — Other Ambulatory Visit: Payer: Self-pay | Admitting: Family Medicine

## 2021-08-29 DIAGNOSIS — M5136 Other intervertebral disc degeneration, lumbar region: Secondary | ICD-10-CM

## 2021-08-29 DIAGNOSIS — G8929 Other chronic pain: Secondary | ICD-10-CM

## 2021-08-29 DIAGNOSIS — M545 Low back pain, unspecified: Secondary | ICD-10-CM

## 2021-10-18 NOTE — Progress Notes (Deleted)
Created in error

## 2021-10-20 IMAGING — CT CT L SPINE W/O CM
4 series · 15 of 33 positions shown, 18 images · non-contrast
Comparison: None.

CLINICAL DATA: Degeneration of the lumbar intervertebral disc.
Anterior thigh and low back pain. Patient has had some relief of the
radicular symptoms from epidural steroid injections. No relief of
the back pain.

EXAM:
CT LUMBAR SPINE WITHOUT CONTRAST
TECHNIQUE: Multidetector CT imaging of the lumbar spine was performed without
intravenous contrast administration. Multiplanar CT image
reconstructions were also generated.

[Series 3: l-spine 2.00 br40 s3 lspine st · axial · 0.33mm/px · z∈[+1411,+1499]mm · 5 of 67 slices shown, 7 images]
[im 12/67  soft-tissue]
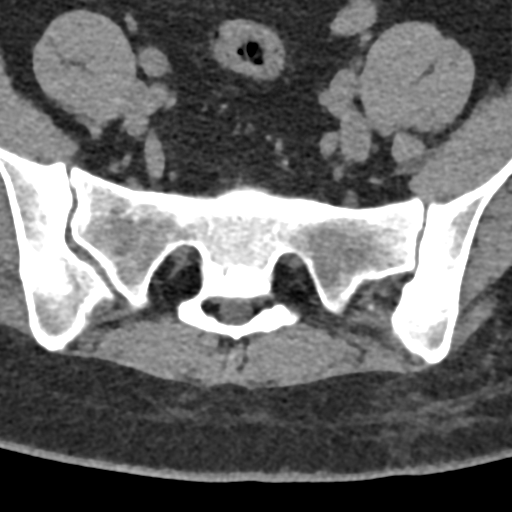
[im 12/67  bone]
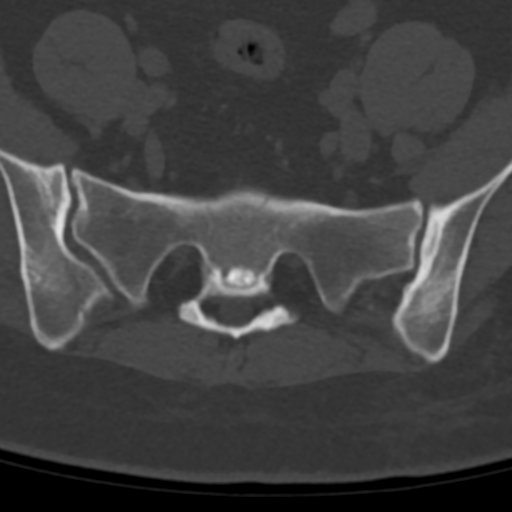
[im 23/67  bone]
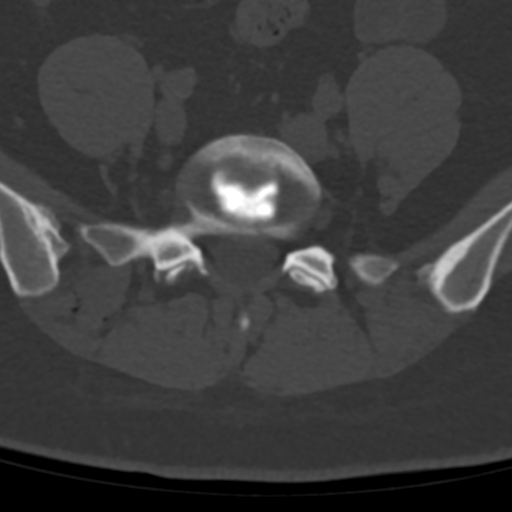
[im 34/67  bone]
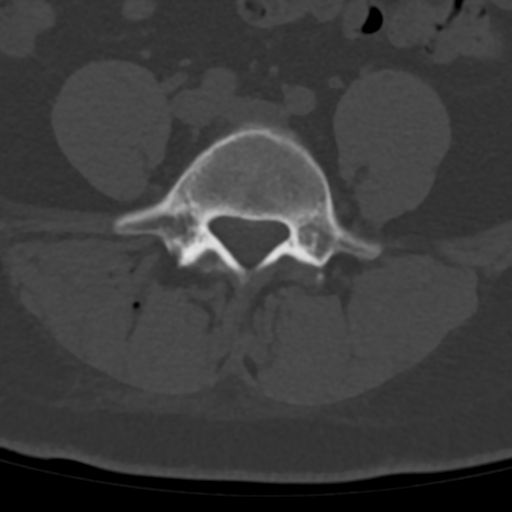
[im 45/67  bone]
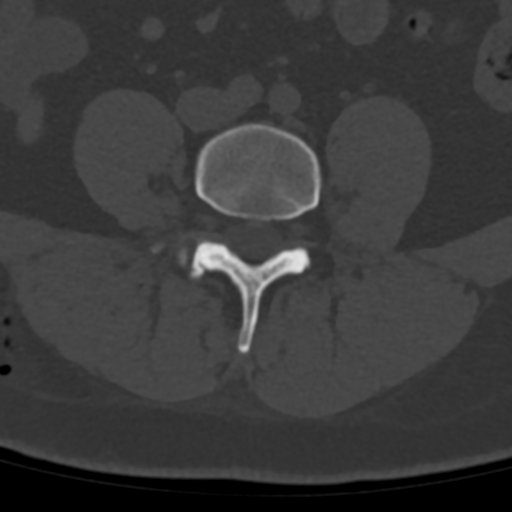
[im 56/67  soft-tissue]
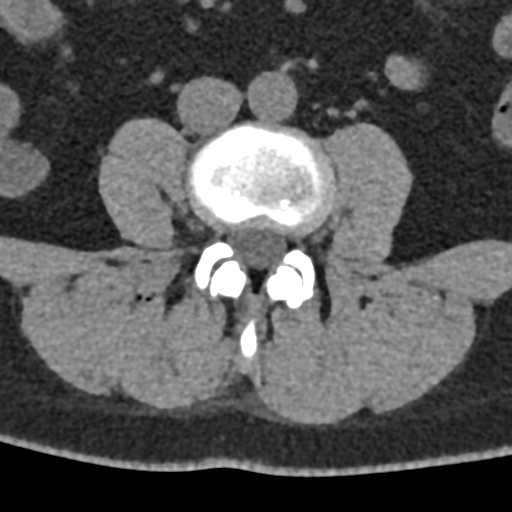
[im 56/67  bone]
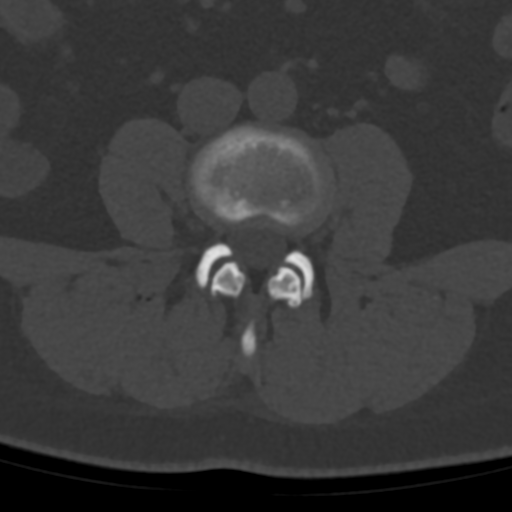

[Series 4: l-spine 2.00 br60 s3 lspine bone · axial · 0.33mm/px · z∈[+1411,+1433]mm · 2 of 67 slices shown]
[im 12/67  bone]
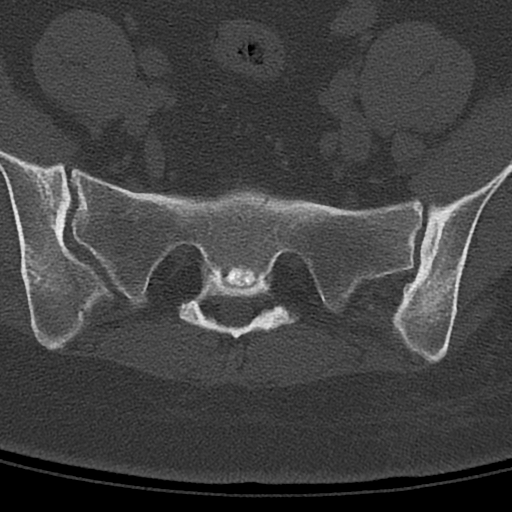
[im 23/67  bone]
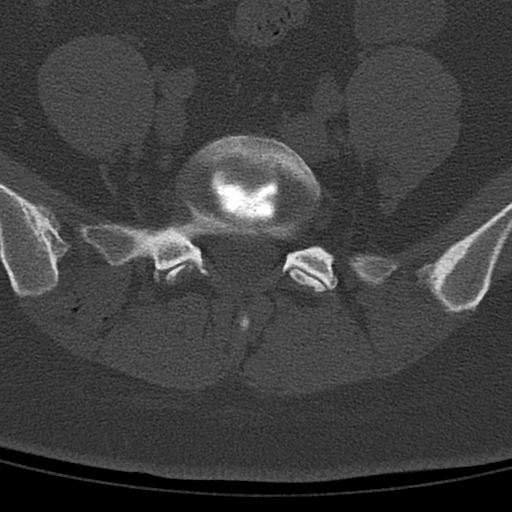

[Series 5: l-spine 2.00 br60 s3 sag bone · sagittal · 0.26mm/px · 5 of 73 slices shown, 6 images]
[im 25/73  bone]
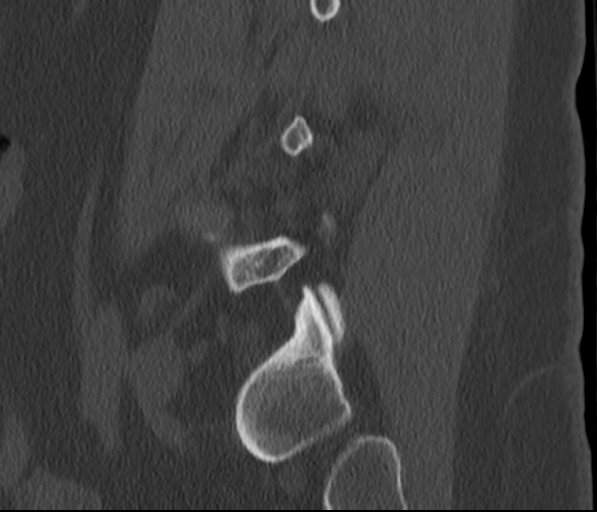
[im 31/73  bone]
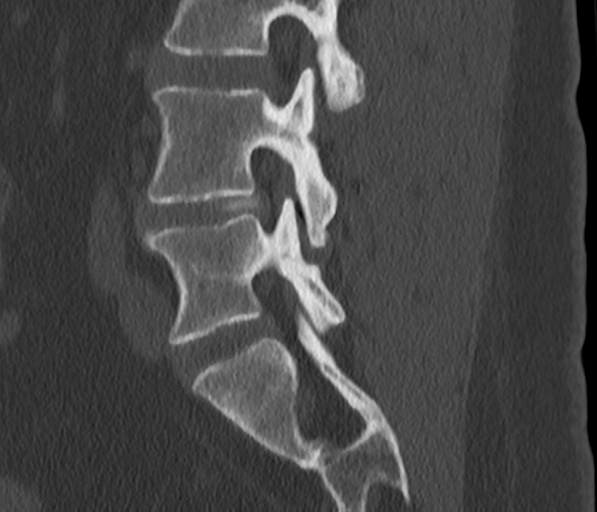
[im 37/73  soft-tissue]
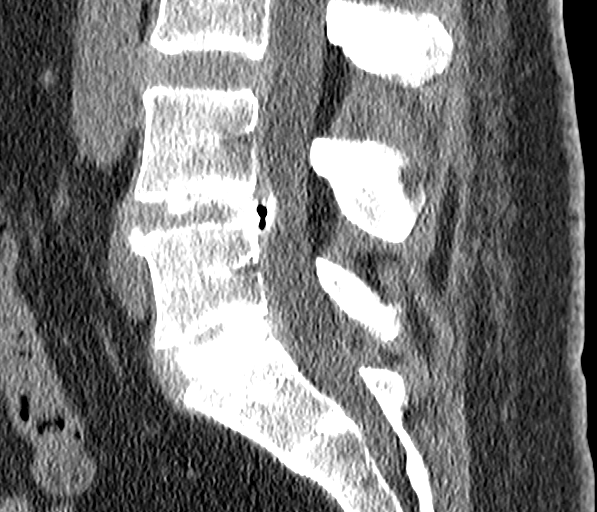
[im 37/73  bone]
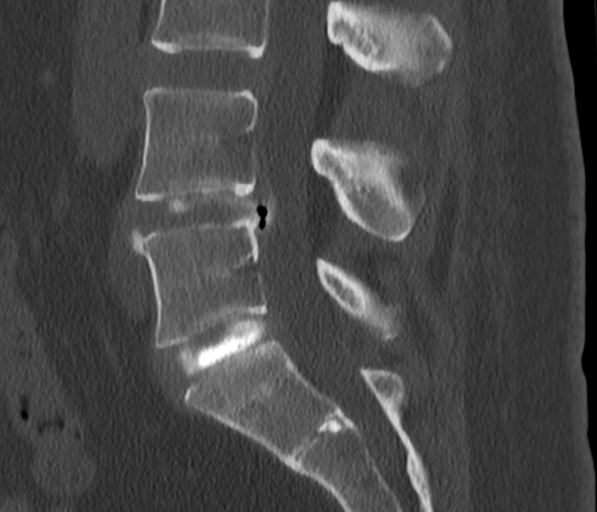
[im 43/73  bone]
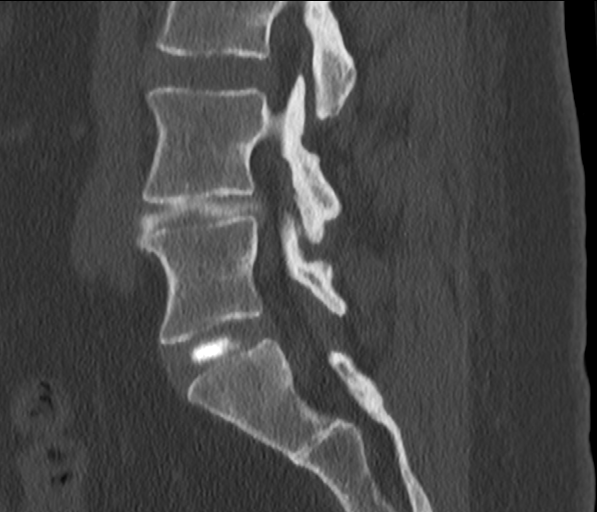
[im 49/73  bone]
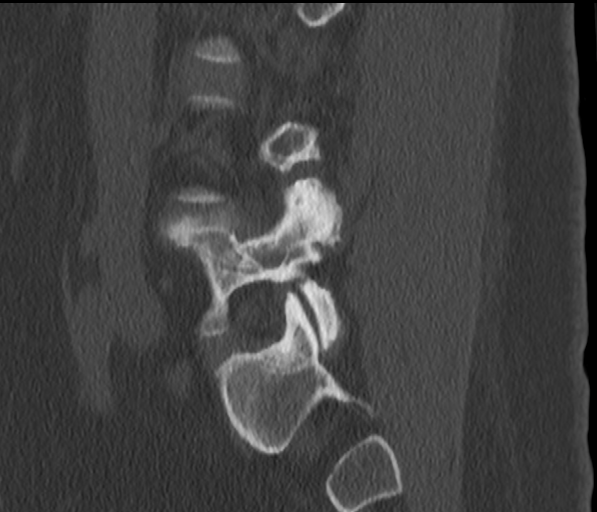

[Series 7: l-spine 2.00 br60 s3 cor bone · coronal · 0.26mm/px · 3 of 78 slices shown]
[im 16/78  bone]
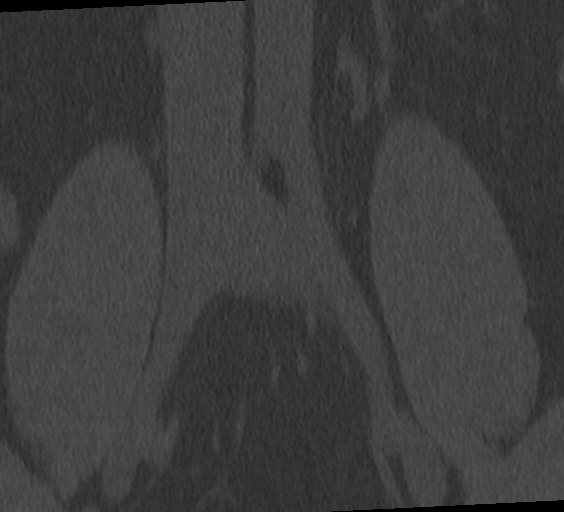
[im 31/78  bone]
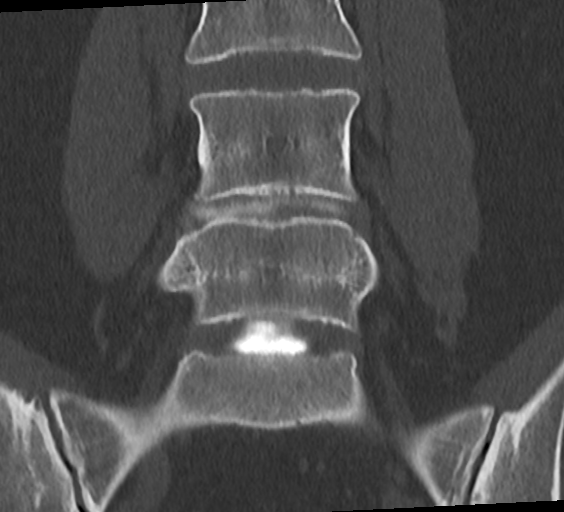
[im 47/78  bone]
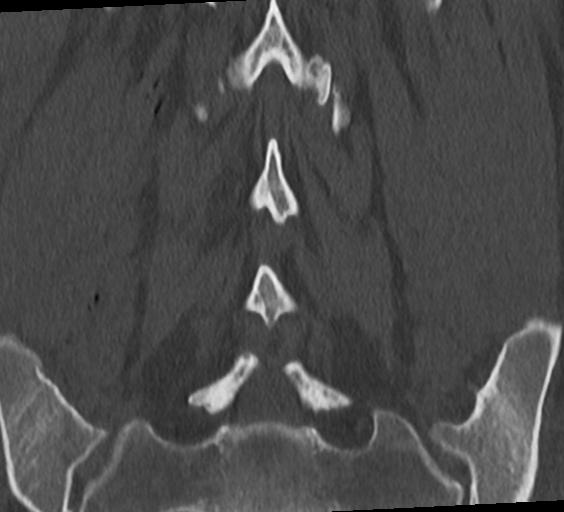

[15 of 33 positions shown; findings below may reference images not displayed]

FINDINGS: Alignment: No significant listhesis is present

Vertebrae: The vertebral body heights in visualized sacrum are
normal. No acute or healing fractures are present.

Paraspinal and other soft tissues: Limited imaging the abdomen is
unremarkable. There is no significant adenopathy. No solid organ
lesions are present.

Disc levels: L3-4: This disc was not injected. No focal protrusion
or stenosis is present.

L4-5: Contrast injected into the disc disperses diffusely throughout
annular fibers on the right. A posterior and right lateral disc
protrusion is present. This results an mild central canal stenosis
and right greater than left foraminal narrowing.

L5-S1: Contrast injected is contained within the central neck base.
No focal disc protrusion or annular tear is present. There is no
significant stenosis.
IMPRESSION: 1. Diffuse disc degeneration at L4-5 centrally and on the right.
2. Mild central and bilateral foraminal stenosis at L4-5.
3. Contrast injected at L5-S1 remains within the central nucleus.

## 2021-12-06 ENCOUNTER — Encounter: Payer: Self-pay | Admitting: Family Medicine

## 2021-12-19 ENCOUNTER — Other Ambulatory Visit: Payer: Self-pay | Admitting: Obstetrics and Gynecology

## 2021-12-19 DIAGNOSIS — Z1231 Encounter for screening mammogram for malignant neoplasm of breast: Secondary | ICD-10-CM

## 2021-12-31 ENCOUNTER — Other Ambulatory Visit: Payer: Self-pay | Admitting: Family Medicine

## 2021-12-31 DIAGNOSIS — F419 Anxiety disorder, unspecified: Secondary | ICD-10-CM

## 2022-02-21 NOTE — Procedures (Unsigned)
Created in error

## 2022-02-25 ENCOUNTER — Other Ambulatory Visit: Payer: Self-pay | Admitting: Family Medicine

## 2022-02-25 DIAGNOSIS — F419 Anxiety disorder, unspecified: Secondary | ICD-10-CM

## 2022-05-20 ENCOUNTER — Encounter: Payer: Self-pay | Admitting: Family Medicine

## 2022-07-02 ENCOUNTER — Other Ambulatory Visit: Payer: Self-pay | Admitting: Family Medicine

## 2022-07-02 DIAGNOSIS — F419 Anxiety disorder, unspecified: Secondary | ICD-10-CM

## 2022-07-02 DIAGNOSIS — F32A Depression, unspecified: Secondary | ICD-10-CM

## 2022-07-10 ENCOUNTER — Encounter: Payer: Self-pay | Admitting: Adult Health

## 2022-07-10 ENCOUNTER — Ambulatory Visit: Payer: Managed Care, Other (non HMO) | Admitting: Adult Health

## 2022-07-10 DIAGNOSIS — F32A Depression, unspecified: Secondary | ICD-10-CM

## 2022-07-10 DIAGNOSIS — F419 Anxiety disorder, unspecified: Secondary | ICD-10-CM

## 2022-07-10 MED ORDER — SERTRALINE HCL 25 MG PO TABS: 25.0000 mg | ORAL_TABLET | Freq: Every day | ORAL | 0 refills | Status: DC

## 2022-07-10 NOTE — Progress Notes (Signed)
Subjective:    Patient ID: Lori Mann, female    DOB: 1991-06-29, 31 y.o.   MRN: [id number]  HPI 31 year old female who  has a past medical history of Allergic rhinitis, Allergy, Asthma, H/O infectious mononucleosis, IBS (irritable bowel syndrome), Kidney stones, Lumbar herniated disc, and Narcolepsy without cataplexy(347.00).  She is a patient of Dr. Salomon Fick who I am seeing today for an acute issue. She has not had a CPE in over a year and has ru out of her prescription for Sertraline 25 mg daily.  She feels as though she is well controlled on the medication. She took her last pill yesterday.      Review of Systems See HPI   Past Medical History:  Diagnosis Date   Allergic rhinitis    Allergy    Asthma    H/O infectious mononucleosis    IBS (irritable bowel syndrome)    Kidney stones    Lumbar herniated disc    Narcolepsy without cataplexy(347.00)     Social History   Socioeconomic History   Marital status: Married    Spouse name: Not on file   Number of children: Not on file   Years of education: Not on file   Highest education level: Master's degree (e.g., MA, MS, MEng, MEd, MSW, MBA)  Occupational History   Occupation: Marketing  Tobacco Use   Smoking status: Never   Smokeless tobacco: Never  Vaping Use   Vaping Use: Never used  Substance and Sexual Activity   Alcohol use: Yes    Alcohol/week: 0.0 standard drinks of alcohol   Drug use: No   Sexual activity: Yes    Birth control/protection: Pill  Other Topics Concern   Not on file  Social History Narrative   Lives with husband   Social Determinants of Health   Financial Resource Strain: Low Risk  (07/09/2022)   Overall Financial Resource Strain (CARDIA)    Difficulty of Paying Living Expenses: Not hard at all  Food Insecurity: No Food Insecurity (07/09/2022)   Hunger Vital Sign    Worried About Running Out of Food in the Last Year: Never true    Ran Out of Food in the Last Year: Never true   Transportation Needs: No Transportation Needs (07/09/2022)   PRAPARE - Administrator, Civil Service (Medical): No    Lack of Transportation (Non-Medical): No  Physical Activity: Insufficiently Active (07/09/2022)   Exercise Vital Sign    Days of Exercise per Week: 3 days    Minutes of Exercise per Session: 30 min  Stress: Stress Concern Present (07/09/2022)   [employer] of Occupational Health - Occupational Stress Questionnaire    Feeling of Stress : Very much  Social Connections: Unknown (07/09/2022)   Social Connection and Isolation Panel [NHANES]    Frequency of Communication with Friends and Family: More than three times a week    Frequency of Social Gatherings with Friends and Family: Once a week    Attends Religious Services: Patient refused    Database administrator or Organizations: No    Attends Engineer, structural: Not on file    Marital Status: Married  Catering manager Violence: Not on file    Past Surgical History:  Procedure Laterality Date   CYSTOSCOPY WITH RETROGRADE PYELOGRAM, URETEROSCOPY AND STENT PLACEMENT Right 10/11/2015   Procedure: CYSTOSCOPY WITH RETROGRADE PYELOGRAM, URETEROSCOPY,STONE EXTRACTIONAND STENT PLACEMENT;  Surgeon: Marcine Matar, MD;  Location: [facility];  Service: Urology;  Laterality: Right;   HOLMIUM LASER APPLICATION Right 10/11/2015   Procedure: HOLMIUM LASER APPLICATION;  Surgeon: Marcine Matar, MD;  Location: [facility];  Service: Urology;  Laterality: Right;   NASAL SEPTUM SURGERY     TONSILLECTOMY      Family History  Problem Relation Age of Onset   Cancer Mother        breast ca   Arthritis Father    Hyperlipidemia Father    Irritable bowel syndrome Sister     Allergies  Allergen Reactions   Nickel Rash    Burning rash   Benzoyl Peroxide Rash    Current Outpatient Medications on File Prior to Visit  Medication Sig Dispense Refill   albuterol (VENTOLIN  HFA) 108 (90 Base) MCG/ACT inhaler 2 PUFFS INHALATION EVERY 4-6 HRS AS NEEDED COUGH/WHEEZE/1/2 HOUR PRIOR TO EXERCISE 90 DAYS     drospirenone-ethinyl estradiol (YAZ,GIANVI,LORYNA) 3-0.02 MG tablet Take 1 tablet by mouth daily.     EPINEPHrine 0.3 mg/0.3 mL IJ SOAJ injection Inject 0.3 mg into the muscle once as needed (allergic reaction).      levocetirizine (XYZAL) 5 MG tablet Take 5 mg by mouth daily.     methylphenidate (RITALIN) 5 MG tablet Take 1 tablet (5 mg total) by mouth 2 (two) times daily. 60 tablet 0   Olopatadine HCl 0.2 % SOLN olopatadine 0.2 % eye drops  INSTILL ONE DROP INTO BOTH EYES EVERY DAY     Spacer/Aero-Holding Chambers (E-Z SPACER) inhaler Use as instructed 1 each 2   No current facility-administered medications on file prior to visit.    BP 100/80 (BP Location: Left Arm, Patient Position: Sitting, Cuff Size: Large)   Pulse 85   Temp 98.8 F (37.1 C) (Oral)   Ht 5\' 6"  (1.676 m)   Wt 205 lb 12.8 oz (93.4 kg)   LMP 06/12/2022 (Approximate)   SpO2 99%   BMI 33.22 kg/m       Objective:   Physical Exam Vitals and nursing note reviewed.  Constitutional:      Appearance: Normal appearance.  Skin:    General: Skin is warm and dry.  Neurological:     General: No focal deficit present.     Mental Status: She is alert and oriented to person, place, and time.  Psychiatric:        Mood and Affect: Mood normal.        Behavior: Behavior normal.        Thought Content: Thought content normal.        Judgment: Judgment normal.       Assessment & Plan:  1. Anxiety and depression - Advised patient that I would refill for 30 days. She will need to follow up with her PCP for further refills  - sertraline (ZOLOFT) 25 MG tablet; Take 1 tablet (25 mg total) by mouth daily.  Dispense: 30 tablet; Refill: 0  06/14/2022, NP

## 2022-08-02 ENCOUNTER — Other Ambulatory Visit: Payer: Self-pay | Admitting: Adult Health

## 2022-08-02 DIAGNOSIS — F419 Anxiety disorder, unspecified: Secondary | ICD-10-CM

## 2022-08-08 ENCOUNTER — Ambulatory Visit: Payer: Managed Care, Other (non HMO) | Admitting: Family Medicine

## 2022-08-08 VITALS — BP 116/82 | HR 90 | Temp 98.7°F | Wt 203.2 lb

## 2022-08-08 DIAGNOSIS — J40 Bronchitis, not specified as acute or chronic: Secondary | ICD-10-CM

## 2022-08-08 DIAGNOSIS — F320 Major depressive disorder, single episode, mild: Secondary | ICD-10-CM | POA: Diagnosis not present

## 2022-08-08 DIAGNOSIS — F411 Generalized anxiety disorder: Secondary | ICD-10-CM | POA: Diagnosis not present

## 2022-08-08 MED ORDER — SERTRALINE HCL 50 MG PO TABS: 50.0000 mg | ORAL_TABLET | Freq: Every day | ORAL | 3 refills | Status: DC

## 2022-08-08 NOTE — Progress Notes (Signed)
Subjective:    Patient ID: Lori Mann, female    DOB: 18-Apr-1991, 31 y.o.   MRN: [id number]  Chief Complaint  Patient presents with   Medication Refill    Sertraline     HPI Patient was seen today for f/u.  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  Patient states she cannot focus on which test need to be completed first and is easily distracted at work.  Patient endorses anxiety causing her mind to race for about 20 to 30 minutes before falling asleep each night.  Patient states she feels exhausted upon returning home from work.   Patient recently seen by neurologist for cough x3 weeks.  Patient states cough increases at night and is somewhat productive but difficult to cough up phlegm.  Patient given Z-Pak, steroid injection, and albuterol inhaler for symptoms.  Patient notes some improvement but still coughing.  Past Medical History:  Diagnosis Date   Allergic rhinitis    Allergy    Asthma    H/O infectious mononucleosis    IBS (irritable bowel syndrome)    Kidney stones    Lumbar herniated disc    Narcolepsy without cataplexy(347.00)     Allergies  Allergen Reactions   Nickel Rash    Burning rash   Benzoyl Peroxide Rash    ROS General: Denies fever, chills, night sweats, changes in weight, changes in appetite HEENT: Denies headaches, ear pain, changes in vision, rhinorrhea, sore throat CV: Denies CP, palpitations, SOB, orthopnea Pulm: Denies SOB, wheezing  +cough GI: Denies abdominal pain, nausea, vomiting, diarrhea, constipation GU: Denies dysuria, hematuria, frequency, vaginal discharge Msk: Denies muscle cramps, joint pains Neuro: Denies weakness, numbness, tingling Skin: Denies rashes, bruising Psych: Denies depression, anxiety, hallucinations  +anxiety    Objective:    Blood pressure 116/82, pulse 90, temperature 98.7 F (37.1 C), temperature source  Oral, weight 203 lb 3.2 oz (92.2 kg), last menstrual period 06/12/2022, SpO2 97 %.  Gen. Pleasant, well-nourished, in no distress, normal affect   HEENT: /AT, face symmetric, conjunctiva clear, no scleral icterus, PERRLA, EOMI, nares patent without drainage, pharynx without erythema or exudate.  TMs full bilaterally.  No cervical lymphadenopathy. Lungs: no accessory muscle use, CTAB, no wheezes or rales Cardiovascular: RRR, no m/r/g, no peripheral edema Musculoskeletal: No deformities, no cyanosis or clubbing, normal tone Neuro:  A&Ox3, CN II-XII intact, normal gait Skin:  Warm, no lesions/ rash   Wt Readings from Last 3 Encounters:  08/08/22 203 lb 3.2 oz (92.2 kg)  07/10/22 205 lb 12.8 oz (93.4 kg)  06/10/21 210 lb 6.4 oz (95.4 kg)    Lab Results  Component Value Date   WBC 7.6 06/10/2021   HGB 13.1 06/10/2021   HCT 38.2 06/10/2021   PLT 338.0 06/10/2021   GLUCOSE 103 (H) 06/10/2021   CHOL 174 06/10/2021   TRIG 254.0 (H) 06/10/2021   HDL 51.30 06/10/2021   LDLDIRECT 94.0 06/10/2021   LDLCALC 74 06/26/2015   ALT 21 01/29/2017   AST 16 01/29/2017   NA 138 06/10/2021   K 4.7 06/10/2021   CL 105 06/10/2021   CREATININE 0.73 06/10/2021   BUN 13 06/10/2021   CO2 24 06/10/2021   TSH 2.99 06/10/2021   HGBA1C 5.6 06/10/2021      08/08/2022    9:08 AM 07/10/2022    4:06 PM 06/10/2021   10:43 AM  Depression  screen PHQ 2/9  Decreased Interest 1 1 1   Down, Depressed, Hopeless 1 1 1   PHQ - 2 Score 2 2 2   Altered sleeping 2 1 2   Tired, decreased energy 2 3 2   Change in appetite 1 2 1   Feeling bad or failure about yourself  1 2 2   Trouble concentrating 2 1 1   Moving slowly or fidgety/restless 0 0 1  Suicidal thoughts 0 0 0  PHQ-9 Score 10 11 11   Difficult doing work/chores Somewhat difficult Somewhat difficult Somewhat difficult      06/10/2021   10:44 AM 01/05/2020    7:29 PM 11/24/2019    3:39 PM  GAD 7 : Generalized Anxiety Score  Nervous, Anxious, on Edge 3 3 3    Control/stop worrying 3 2 3   Worry too much - different things 2 3 3   Trouble relaxing 2 1 2   Restless 1 1 1   Easily annoyed or irritable 2 2 2   Afraid - awful might happen 2 2 3   Total GAD 7 Score 15 14 17   Anxiety Difficulty Somewhat difficult Somewhat difficult       Assessment/Plan:  GAD (generalized anxiety disorder) -subjective increase in symptoms likely 2/2 increased work load/responsibilities -Discussed the importance of self-care -Counseling -Continue Zoloft 50 mg daily  - Plan: sertraline (ZOLOFT) 50 MG tablet  Depression, major, single episode, mild (HCC) -Stable -PHQ-9 score 10 -Counseling -Continue Zoloft 50 mg daily -Self-care encouraged.  Bronchitis -improving -discussed likely duration of cough -Okay to continue albuterol inhaler as needed -Discussed OTC cough/cold medications, antihistamine, local honey, warm fluids, cough drops, etc.  F/u in 6-8 wks prn for anxiety  , MD

## 2022-08-26 ENCOUNTER — Encounter: Payer: Self-pay | Admitting: Family Medicine

## 2022-09-05 ENCOUNTER — Telehealth (INDEPENDENT_AMBULATORY_CARE_PROVIDER_SITE_OTHER): Payer: Managed Care, Other (non HMO) | Admitting: Family Medicine

## 2022-09-05 ENCOUNTER — Encounter: Payer: Self-pay | Admitting: Family Medicine

## 2022-09-05 ENCOUNTER — Ambulatory Visit (INDEPENDENT_AMBULATORY_CARE_PROVIDER_SITE_OTHER): Payer: Managed Care, Other (non HMO) | Admitting: Family Medicine

## 2022-09-05 DIAGNOSIS — U071 COVID-19: Secondary | ICD-10-CM | POA: Diagnosis not present

## 2022-09-05 DIAGNOSIS — R112 Nausea with vomiting, unspecified: Secondary | ICD-10-CM

## 2022-09-05 DIAGNOSIS — H9203 Otalgia, bilateral: Secondary | ICD-10-CM | POA: Diagnosis not present

## 2022-09-05 MED ORDER — PROMETHAZINE HCL 25 MG PO TABS: 25.0000 mg | ORAL_TABLET | Freq: Three times a day (TID) | ORAL | 0 refills | Status: DC | PRN

## 2022-09-05 MED ORDER — PROMETHAZINE HCL 25 MG/ML IJ SOLN
25.0000 mg | Freq: Once | INTRAMUSCULAR | Status: AC
  Administered 2022-09-05: 25 mg via INTRAMUSCULAR

## 2022-09-05 MED ORDER — PROMETHAZINE HCL 25 MG PO TABS: 25.0000 mg | ORAL_TABLET | Freq: Once | ORAL | Status: DC

## 2022-09-05 MED ORDER — AMOXICILLIN 500 MG PO TABS: 500.0000 mg | ORAL_TABLET | Freq: Two times a day (BID) | ORAL | 0 refills | Status: AC

## 2022-09-05 NOTE — Progress Notes (Signed)
error 

## 2022-09-05 NOTE — Patient Instructions (Addendum)
Nutribiotic ear drops with tea tree oil and grapefruit seed extract.  1 fl oz. These can help with dizziness/vertigo symptoms and can be found at your local natural store or online.  A prescription for Phenergan was sent to your pharmacy.  You were given a dose in clinic today.  If you are still having continued nausea you can take another dose this evening around 11 PM.  It is important that you try to stay hydrated.  You can take small sips of fluids so that you will have less nausea.  Electrolyte replacement drinks can be helpful.  You can also dilute apple juice one-to-one to help with symptoms.  A wait-and-see prescription for amoxicillin was sent to your pharmacy.  You can start this for continued or worsening ear pain over the weekend.

## 2022-09-05 NOTE — Progress Notes (Signed)
Virtual Visit via Video Note  I connected with Lori Mann on 09/05/22 at  3:30 PM EDT by a video enabled telemedicine application 2/2 COVID-19 pandemic and verified that I am speaking with the correct person using two identifiers.  Location patient: In-clinic exam room Location provider:work  Persons participating in the virtual visit: patient, provider  I discussed the limitations of evaluation and management by telemedicine and the availability of in person appointments. The patient expressed understanding and agreed to proceed.  Chief Complaint  Patient presents with   Nausea    And dizziness from cruise she returned from. Tested + on Sunday,     HPI: Pt is a 31 yo female with pmh sig for h/o anxiety, history of herniated disc, obesity, narcolepsy, asthma, IBS, seasonal allergies who was seen for acute concern. Pt went on a cruise from 9/2-08/30/22.   Pt had cold chills and body aches on 9/8.  Thought 2/2 dehydration and sun. While on plane, 9/9 couldn't pop ears.  Felt run down.  Started coughing. Lost smell and taste on Sunday 9/10.  Also developed dizziness, nauseous, and emesis. Seen at [facility] on Mon 9/11, thought she had an ear infection.  Told ears ok.  Positive COVID test at [facility].  Told symptoms from COVID. UC gave pt Zytrec-D, flonase bid and zofran and antivert which have not helped. Started dramamine. Also with headache x 2 wks Denies sinus pressure, fever, diarrhea.  ROS: See pertinent positives and negatives per HPI.  Past Medical History:  Diagnosis Date   Allergic rhinitis    Allergy    Asthma    H/O infectious mononucleosis    IBS (irritable bowel syndrome)    Kidney stones    Lumbar herniated disc    Narcolepsy without cataplexy(347.00)     Past Surgical History:  Procedure Laterality Date   CYSTOSCOPY WITH RETROGRADE PYELOGRAM, URETEROSCOPY AND STENT PLACEMENT Right 10/11/2015   Procedure: CYSTOSCOPY WITH RETROGRADE PYELOGRAM, URETEROSCOPY,STONE EXTRACTIONAND  STENT PLACEMENT;  Surgeon: Marcine Matar, MD;  Location: [facility];  Service: Urology;  Laterality: Right;   HOLMIUM LASER APPLICATION Right 10/11/2015   Procedure: HOLMIUM LASER APPLICATION;  Surgeon: Marcine Matar, MD;  Location: [facility];  Service: Urology;  Laterality: Right;   NASAL SEPTUM SURGERY     TONSILLECTOMY      Family History  Problem Relation Age of Onset   Cancer Mother        breast ca   Arthritis Father    Hyperlipidemia Father    Irritable bowel syndrome Sister    Current Outpatient Medications:    albuterol (VENTOLIN HFA) 108 (90 Base) MCG/ACT inhaler, 2 PUFFS INHALATION EVERY 4-6 HRS AS NEEDED COUGH/WHEEZE/1/2 HOUR PRIOR TO EXERCISE 90 DAYS, Disp: , Rfl:    drospirenone-ethinyl estradiol (YAZ,GIANVI,LORYNA) 3-0.02 MG tablet, Take 1 tablet by mouth daily., Disp: , Rfl:    EPINEPHrine 0.3 mg/0.3 mL IJ SOAJ injection, Inject 0.3 mg into the muscle once as needed (allergic reaction). , Disp: , Rfl:    levocetirizine (XYZAL) 5 MG tablet, Take 5 mg by mouth daily., Disp: , Rfl:    methylphenidate (RITALIN) 5 MG tablet, Take 1 tablet (5 mg total) by mouth 2 (two) times daily., Disp: 60 tablet, Rfl: 0   Olopatadine HCl 0.2 % SOLN, olopatadine 0.2 % eye drops  INSTILL ONE DROP INTO BOTH EYES EVERY DAY, Disp: , Rfl:    sertraline (ZOLOFT) 50 MG tablet, Take 1 tablet (50 mg total) by mouth daily., Disp:  90 tablet, Rfl: 3   Spacer/Aero-Holding Chambers (E-Z SPACER) inhaler, Use as instructed, Disp: 1 each, Rfl: 2  EXAM:  VITALS per patient if applicable: RR between 12-20 bpm  GENERAL: alert, oriented, appears well and in no acute distress  HEENT: atraumatic, conjunctiva clear, no obvious abnormalities on inspection of external nose and ears  NECK: normal movements of the head and neck  LUNGS: on inspection no signs of respiratory distress, breathing rate appears normal, no obvious gross SOB, gasping or wheezing  CV: no obvious  cyanosis  MS: moves all visible extremities without noticeable abnormality  PSYCH/NEURO: pleasant and cooperative, no obvious depression or anxiety, speech and thought processing grossly intact  ASSESSMENT AND PLAN:  Discussed the following assessment and plan:  COVID-19 virus infection -symptoms starting 08/29/22, with positive COVID test 08/30/22 -continue supportive care including hydration, OTC cold medication, Tyelonl, etc -given continued dizziness and nausea pt given phenergan IM  in clinic.  (Pt's husband drove her to appt) -given rx for phenegran prn for continued symptoms -also consider OTC ear drop for dizziness -given strict precautions -given note for work with return 09/10/22 if symptoms have resolved.  Nausea and vomiting, unspecified vomiting type  - Plan: promethazine (PHENERGAN) 25 MG tablet, promethazine (PHENERGAN) tablet 25 mg  Otalgia of both ears -Likely 2/2 eustachian tube dysfunction -Continue Flonase as needed, steam from shower -wait and see rx sent to pharmacy.  Start for continued or worsened symptoms over the wknd.  Follow-up as needed for continued or worsening symptoms.   I discussed the assessment and treatment plan with the patient. The patient was provided an opportunity to ask questions and all were answered. The patient agreed with the plan and demonstrated an understanding of the instructions.   The patient was advised to call back or seek an in-person evaluation if the symptoms worsen or if the condition fails to improve as anticipated.  Deeann Saint, MD

## 2022-12-03 LAB — HM PAP SMEAR: HM Pap smear: NORMAL

## 2023-01-03 ENCOUNTER — Other Ambulatory Visit: Payer: Self-pay | Admitting: Family Medicine

## 2023-01-03 DIAGNOSIS — Z3040 Encounter for surveillance of contraceptives, unspecified: Secondary | ICD-10-CM

## 2023-01-19 ENCOUNTER — Telehealth: Payer: Self-pay | Admitting: Family Medicine

## 2023-01-19 NOTE — Telephone Encounter (Signed)
The following message was sent in the patient's MyChart in basket:  The next available appt for the pulmonology department was in September and I need a refill on my medication for narcolepsy. Hoping Dr Volanda Napoleon can help me until my appt.

## 2023-01-19 NOTE — Telephone Encounter (Signed)
Attempted to contacted pt to follow up what medication she is taking for her narcolepsy. Left a voicemail to call us back.

## 2023-01-23 ENCOUNTER — Encounter: Payer: Self-pay | Admitting: Pulmonary Disease

## 2023-01-23 ENCOUNTER — Ambulatory Visit: Payer: Managed Care, Other (non HMO) | Admitting: Pulmonary Disease

## 2023-01-23 VITALS — BP 118/68 | HR 92 | Ht 66.0 in | Wt 212.0 lb

## 2023-01-23 DIAGNOSIS — J4520 Mild intermittent asthma, uncomplicated: Secondary | ICD-10-CM

## 2023-01-23 DIAGNOSIS — Z87898 Personal history of other specified conditions: Secondary | ICD-10-CM | POA: Diagnosis not present

## 2023-01-23 MED ORDER — FLUTICASONE FUROATE-VILANTEROL 200-25 MCG/ACT IN AEPB: 1.0000 | INHALATION_SPRAY | Freq: Every day | RESPIRATORY_TRACT | 5 refills | Status: DC

## 2023-01-23 MED ORDER — METHYLPHENIDATE HCL 10 MG PO TABS: 10.0000 mg | ORAL_TABLET | Freq: Two times a day (BID) | ORAL | 0 refills | Status: AC

## 2023-01-23 NOTE — Addendum Note (Signed)
Addended by: June Leap on: 01/23/2023 04:23 PM   Modules accepted: Orders

## 2023-01-23 NOTE — Telephone Encounter (Signed)
Contact pt. Pt updates she is able to get an appt with her Pulmonologist. No further action is needed.

## 2023-01-23 NOTE — Patient Instructions (Addendum)
Chest x-ray  Pulmonary function test with methacholine challenge  Schedule in lab sleep study  Will send in a prescription for Ritalin at 10 mg  Prescription for Breo sent to the pharmacy for you to be used once a day -Ensure you rinse your mouth after use on a daily basis  Albuterol to be used as needed  Call us with significant concerns  Tentative follow-up in about 3 months

## 2023-01-23 NOTE — Progress Notes (Signed)
Lori Mann    [id number]    1991/12/16  Primary Care Physician:Banks, Langley Adie, MD  Referring Physician: Billie Ruddy, [address] [place],  [place] [address]  Chief complaint:   Patient with a history of idiopathic hypersomnolence  HPI:  She was last seen about 2022 for excessive daytime sleepiness  Recently had a bronchitis in August and followed by COVID infection Since then she has been coughing more, wheezing more, need for rescue inhaler which she is using up to 4 times a day Activity level is also decreased over that time.  Still has a lot of sleepiness during the day, she was using Ritalin 5 mg at most twice a day, since the COVID infection and has had to go up to up to 10 mg up to twice a day because she just cannot stay alert enough to function safely  She admits to some snoring, waking up wheezing sometimes Dryness of her mouth in the mornings Very rare headaches Usually goes to bed about 11, wake up time about 8 AM  Did not tolerate Provigil in the past, was unable to sleep for many days  Previous study revealed mild obstructive sleep apnea MSLT showed idiopathic hypersomnolence with 1 out of 5 sleep onset REM periods  Tried different medications but Ridling was the only 1 she was able to tolerate and worked well  Weight is stable  Outpatient Encounter Medications as of 01/23/2023  Medication Sig   albuterol (VENTOLIN HFA) 108 (90 Base) MCG/ACT inhaler 2 PUFFS INHALATION EVERY 4-6 HRS AS NEEDED COUGH/WHEEZE/1/2 HOUR PRIOR TO EXERCISE 90 DAYS   BREO ELLIPTA 200-25 MCG/ACT AEPB Inhale 1 puff into the lungs daily.   drospirenone-ethinyl estradiol (YAZ,GIANVI,LORYNA) 3-0.02 MG tablet Take 1 tablet by mouth daily.   EPINEPHrine 0.3 mg/0.3 mL IJ SOAJ injection Inject 0.3 mg into the muscle once as needed (allergic reaction).    levocetirizine (XYZAL) 5 MG tablet Take 5 mg by mouth daily.   methylphenidate (RITALIN) 5 MG tablet Take 1  tablet (5 mg total) by mouth 2 (two) times daily.   Olopatadine HCl 0.2 % SOLN olopatadine 0.2 % eye drops  INSTILL ONE DROP INTO BOTH EYES EVERY DAY   sertraline (ZOLOFT) 50 MG tablet Take 1 tablet (50 mg total) by mouth daily.   Spacer/Aero-Holding Chambers (E-Z SPACER) inhaler Use as instructed   promethazine (PHENERGAN) 25 MG tablet Take 1 tablet (25 mg total) by mouth every 8 (eight) hours as needed for nausea or vomiting.   No facility-administered encounter medications on file as of 01/23/2023.    Allergies as of 01/23/2023 - Review Complete 01/23/2023  Allergen Reaction Noted   Nickel Rash 06/21/2018   Benzoyl peroxide Rash 06/21/2018    Past Medical History:  Diagnosis Date   Allergic rhinitis    Allergy    Asthma    H/O infectious mononucleosis    IBS (irritable bowel syndrome)    Kidney stones    Lumbar herniated disc    Narcolepsy without cataplexy(347.00)     Past Surgical History:  Procedure Laterality Date   CYSTOSCOPY WITH RETROGRADE PYELOGRAM, URETEROSCOPY AND STENT PLACEMENT Right 10/11/2015   Procedure: CYSTOSCOPY WITH RETROGRADE PYELOGRAM, URETEROSCOPY,STONE EXTRACTIONAND STENT PLACEMENT;  Surgeon: Franchot Gallo, MD;  Location: [facility];  Service: Urology;  Laterality: Right;   HOLMIUM LASER APPLICATION Right 36/64/4034   Procedure: HOLMIUM LASER APPLICATION;  Surgeon: Franchot Gallo, MD;  Location: [facility];  Service: Urology;  Laterality: Right;   NASAL SEPTUM SURGERY     TONSILLECTOMY      Family History  Problem Relation Age of Onset   Cancer Mother        breast ca   Arthritis Father    Hyperlipidemia Father    Irritable bowel syndrome Sister     Social History   Socioeconomic History   Marital status: Married    Spouse name: Not on file   Number of children: Not on file   Years of education: Not on file   Highest education level: Master's degree (e.g., MA, MS, MEng, MEd, MSW, MBA)  Occupational  History   Occupation: Marketing  Tobacco Use   Smoking status: Never   Smokeless tobacco: Never  Vaping Use   Vaping Use: Never used  Substance and Sexual Activity   Alcohol use: Yes    Alcohol/week: 0.0 standard drinks of alcohol   Drug use: No   Sexual activity: Yes    Birth control/protection: Pill  Other Topics Concern   Not on file  Social History Narrative   Lives with husband   Social Determinants of Health   Financial Resource Strain: Low Risk  (07/09/2022)   Overall Financial Resource Strain (CARDIA)    Difficulty of Paying Living Expenses: Not hard at all  Food Insecurity: No Food Insecurity (07/09/2022)   Hunger Vital Sign    Worried About Running Out of Food in the Last Year: Never true    Ran Out of Food in the Last Year: Never true  Transportation Needs: No Transportation Needs (07/09/2022)   PRAPARE - Hydrologist (Medical): No    Lack of Transportation (Non-Medical): No  Physical Activity: Insufficiently Active (07/09/2022)   Exercise Vital Sign    Days of Exercise per Week: 3 days    Minutes of Exercise per Session: 30 min  Stress: Stress Concern Present (07/09/2022)   [place]    Feeling of Stress : Very much  Social Connections: Unknown (07/09/2022)   Social Connection and Isolation Panel [NHANES]    Frequency of Communication with Friends and Family: More than three times a week    Frequency of Social Gatherings with Friends and Family: Once a week    Attends Religious Services: Patient refused    Marine scientist or Organizations: No    Attends Music therapist: Not on file    Marital Status: Married  Human resources officer Violence: Not on file    Review of Systems  Constitutional:  Negative for fatigue.  Respiratory:  Positive for wheezing. Negative for cough and shortness of breath.   Psychiatric/Behavioral:  Negative for sleep disturbance.      Vitals:   01/23/23 1048  BP: 118/68  Pulse: 92  SpO2: 96%   Physical Exam Constitutional:      Appearance: She is obese.  HENT:     Head: Normocephalic and atraumatic.     Nose: No congestion or rhinorrhea.     Mouth/Throat:     Mouth: Mucous membranes are moist.  Eyes:     General: No scleral icterus.    Pupils: Pupils are equal, round, and reactive to light.  Cardiovascular:     Rate and Rhythm: Normal rate and regular rhythm.     Heart sounds: No murmur heard.    No friction rub.  Pulmonary:     Effort: No respiratory distress.  Breath sounds: No stridor. No wheezing or rhonchi.  Musculoskeletal:     Cervical back: No rigidity or tenderness.  Neurological:     Mental Status: She is alert.  Psychiatric:        Mood and Affect: Mood normal.   Epworth Sleepiness Scale of 13     05/29/2021   11:00 AM  Results of the Epworth flowsheet  Sitting and reading 3  Watching TV 2  Sitting, inactive in a public place (e.g. a theatre or a meeting) 0  As a passenger in a car for an hour without a break 3  Lying down to rest in the afternoon when circumstances permit 3  Sitting and talking to someone 0  Sitting quietly after a lunch without alcohol 2  In a car, while stopped for a few minutes in traffic 0  Total score 13    Data Reviewed: Sleep study from 05/28/2016 reviewed -Negative for significant sleep disordered breathing -MSLT consistent with idiopathic hypersomnolence  Previous notes from Dr. Halford Chessman reviewed  Assessment:  Idiopathic hypersomnolence  Worsening of symptoms recently since she had COVID infection -Despite using regimen, still gets very fatigued easily and sleepy -We did discuss increasing the dose of Ritalin that she is on at present  Daytime sleepiness  Class I obesity  Possible worsening of sleep disordered breathing, we did talk about repeating a sleep study  Persistent cough and wheezing may be related to uncontrolled asthma at  present -Initiate Breo  Plan/Recommendations: Ritalin 10 mg to be used up to twice a day as needed  Risk of worsening sleep disordered breathing discussed with the patient if significant weight gain  Will obtain a pulmonary function test with methacholine challenge  Obtain a chest x-ray  With cough and wheezing that have been persistent-Breo will be initiated  Albuterol use as needed  Schedule for in lab sleep study   Sherrilyn Rist MD [facility] 01/23/2023, 10:59 AM  CC: Billie Ruddy, MD

## 2023-02-02 ENCOUNTER — Ambulatory Visit: Payer: Managed Care, Other (non HMO) | Admitting: Family Medicine

## 2023-02-02 ENCOUNTER — Encounter: Payer: Self-pay | Admitting: Family Medicine

## 2023-02-02 VITALS — BP 106/72 | HR 102 | Temp 98.3°F | Ht 66.0 in | Wt 210.8 lb

## 2023-02-02 DIAGNOSIS — R059 Cough, unspecified: Secondary | ICD-10-CM

## 2023-02-02 DIAGNOSIS — H66001 Acute suppurative otitis media without spontaneous rupture of ear drum, right ear: Secondary | ICD-10-CM | POA: Diagnosis not present

## 2023-02-02 DIAGNOSIS — J029 Acute pharyngitis, unspecified: Secondary | ICD-10-CM | POA: Diagnosis not present

## 2023-02-02 LAB — POCT INFLUENZA A/B
Influenza A, POC: NEGATIVE
Influenza B, POC: NEGATIVE

## 2023-02-02 LAB — POC COVID19 BINAXNOW: SARS Coronavirus 2 Ag: NEGATIVE

## 2023-02-02 LAB — POCT RAPID STREP A (OFFICE): Rapid Strep A Screen: NEGATIVE

## 2023-02-02 MED ORDER — AMOXICILLIN-POT CLAVULANATE 500-125 MG PO TABS: 1.0000 | ORAL_TABLET | Freq: Two times a day (BID) | ORAL | 0 refills | Status: AC

## 2023-02-02 NOTE — Progress Notes (Signed)
Established Patient Office Visit   Subjective  Patient ID: Lori Mann, female    DOB: [birth year]  Age: 32 y.o. MRN: [id number]  Chief Complaint  Patient presents with   Ear Pain    Pt c/o R ear pain. Started yesterday.    Nasal Congestion    Pt reports sx of nasal congestion, chills, body ache, low-grade fever(99.9), sore throat and dry cough. Sx started yesterday. Taking Theraflu powder, allergy medication and tylenol.    Patient is a 32 year old female seen for acute concern.  Patient endorses right ear pain, nasal congestion, chills, body ache, elevated temperature 99.9 F, sore throat x 1 day.  Patient tried TheraFlu, Tylenol, antihistamine for symptoms.  Patient's watch alerted her to elevated heart rate, 135.  Patient staying hydrated.  Possible sick contacts include patient's niece who was in high school.      ROS Negative unless stated above    Objective:     BP 106/72 (BP Location: Right Arm, Patient Position: Sitting, Cuff Size: Large)   Pulse (!) 102   Temp 98.3 F (36.8 C) (Oral)   Ht 5' 6"$  (1.676 m)   Wt 210 lb 12.8 oz (95.6 kg)   LMP 01/28/2023 (Approximate)   SpO2 96%   BMI 34.02 kg/m    Physical Exam Constitutional:      General: She is not in acute distress.    Appearance: Normal appearance.  HENT:     Head: Normocephalic and atraumatic.     Right Ear: Tympanic membrane is erythematous and bulging.     Left Ear: Tympanic membrane normal.     Nose: Nose normal.     Mouth/Throat:     Mouth: Mucous membranes are moist.  Eyes:     Extraocular Movements: Extraocular movements intact.     Conjunctiva/sclera: Conjunctivae normal.     Pupils: Pupils are equal, round, and reactive to light.  Cardiovascular:     Rate and Rhythm: Regular rhythm. Tachycardia present.     Heart sounds: Normal heart sounds. No murmur heard.    No gallop.  Pulmonary:     Effort: Pulmonary effort is normal. No respiratory distress.     Breath sounds: Normal breath  sounds. No wheezing, rhonchi or rales.  Skin:    General: Skin is warm and dry.  Neurological:     Mental Status: She is alert and oriented to person, place, and time.      Results for orders placed or performed in visit on 02/02/23  POC COVID-19  Result Value Ref Range   SARS Coronavirus 2 Ag Negative Negative  POC Influenza A/B  Result Value Ref Range   Influenza A, POC Negative Negative   Influenza B, POC Negative Negative  POC Rapid Strep A  Result Value Ref Range   Rapid Strep A Screen Negative Negative      Assessment & Plan:  Acute suppurative otitis media of right ear without spontaneous rupture of tympanic membrane, recurrence not specified -     Amoxicillin-Pot Clavulanate; Take 1 tablet by mouth in the morning and at bedtime for 7 days.  Dispense: 14 tablet; Refill: 0  Cough, unspecified type -     POC COVID-19 BinaxNow -     POCT Influenza A/B -     POCT rapid strep A  Sore throat -     POCT rapid strep A   Start antibiotic, Augmentin for right AOM.  Okay to continue supportive care with OTC cough/cold medications for other  symptoms likely due to viral URI.  COVID, flu, strep testing negative in clinic.  Rest and hydration encouraged.  Tylenol or NSAIDs as needed for pain/discomfort.  Given strict precautions.  Return if symptoms worsen or fail to improve.   Billie Ruddy, MD

## 2023-02-11 ENCOUNTER — Encounter: Payer: Self-pay | Admitting: Family Medicine

## 2023-02-11 ENCOUNTER — Ambulatory Visit: Payer: Managed Care, Other (non HMO) | Admitting: Family Medicine

## 2023-02-11 VITALS — BP 100/70 | HR 95 | Temp 98.8°F | Ht 66.0 in | Wt 213.0 lb

## 2023-02-11 DIAGNOSIS — H66001 Acute suppurative otitis media without spontaneous rupture of ear drum, right ear: Secondary | ICD-10-CM

## 2023-02-11 DIAGNOSIS — F439 Reaction to severe stress, unspecified: Secondary | ICD-10-CM

## 2023-02-11 DIAGNOSIS — H6991 Unspecified Eustachian tube disorder, right ear: Secondary | ICD-10-CM | POA: Diagnosis not present

## 2023-02-11 MED ORDER — DOXYCYCLINE HYCLATE 100 MG PO TABS: 100.0000 mg | ORAL_TABLET | Freq: Two times a day (BID) | ORAL | 0 refills | Status: AC

## 2023-02-11 NOTE — Progress Notes (Signed)
Established Patient Office Visit   Subjective  Patient ID: Lori Mann, female    DOB: [birth year]  Age: 32 y.o. MRN: [id number]  Chief Complaint  Patient presents with   Recurrent Otitis    Pt is a 31 yo female seen for f/u on R ear.  Pt seen 02/02/23 for AOM, started on Augmentin.  Pt states symptoms improved some, then returned with dull ache and crackling in right ear and dull pain in right side of neck.  Patient also notes right-sided headache above right eye.  Having some nasal congestion.  Tried Excedrin, Tylenol, Alka-Seltzer cold, NyQuil/DayQuil.  Taking Xyzal daily for years.  Has Flonase.  Denies fever, chills, sore throat.    Patient notes anxiety is increased due to increased stress.  Patient in the process of looking for a new job in order to grow career-wise.  Has interviews coming up.      ROS Negative unless stated above    Objective:     BP 100/70   Pulse 95   Temp 98.8 F (37.1 C) (Oral)   Ht 5' 6"$  (1.676 m)   Wt 213 lb (96.6 kg)   LMP 01/28/2023 (Approximate)   SpO2 94%   BMI 34.38 kg/m    Physical Exam Constitutional:      General: She is not in acute distress.    Appearance: Normal appearance.  HENT:     Head: Normocephalic and atraumatic.     Right Ear: Hearing, ear canal and external ear normal.     Left Ear: Hearing, ear canal and external ear normal.     Ears:     Comments: L TM full.  R TM full with supportive fluid and erythema, no retraction or perforation.    Nose: Nose normal.     Mouth/Throat:     Mouth: Mucous membranes are moist.  Eyes:     Extraocular Movements: Extraocular movements intact.     Conjunctiva/sclera: Conjunctivae normal.     Pupils: Pupils are equal, round, and reactive to light.  Cardiovascular:     Rate and Rhythm: Normal rate and regular rhythm.     Heart sounds: Normal heart sounds. No murmur heard.    No gallop.  Pulmonary:     Effort: Pulmonary effort is normal. No respiratory distress.     Breath  sounds: Normal breath sounds. No wheezing, rhonchi or rales.  Skin:    General: Skin is warm and dry.  Neurological:     Mental Status: She is alert and oriented to person, place, and time.       02/11/2023   10:33 AM 02/02/2023    3:11 PM 08/08/2022    9:08 AM  Depression screen PHQ 2/9  Decreased Interest 1 1 1  $ Down, Depressed, Hopeless 1 1 1  $ PHQ - 2 Score 2 2 2  $ Altered sleeping 2 2 2  $ Tired, decreased energy 2 2 2  $ Change in appetite 1 1 1  $ Feeling bad or failure about yourself  1 1 1  $ Trouble concentrating 1 1 2  $ Moving slowly or fidgety/restless 0 0 0  Suicidal thoughts 0 0 0  PHQ-9 Score 9 9 10  $ Difficult doing work/chores Somewhat difficult Somewhat difficult Somewhat difficult      02/11/2023   10:33 AM 02/02/2023    3:11 PM 08/08/2022   10:07 AM 06/10/2021   10:44 AM  GAD 7 : Generalized Anxiety Score  Nervous, Anxious, on Edge 3 2 3 3  $ Control/stop  worrying 3 1 3 3  $ Worry too much - different things 3 2 3 2  $ Trouble relaxing 2 0 2 2  Restless 1 0 1 1  Easily annoyed or irritable 2 2 2 2  $ Afraid - awful might happen 1 0 1 2  Total GAD 7 Score 15 7 15 15  $ Anxiety Difficulty Somewhat difficult Somewhat difficult Somewhat difficult Somewhat difficult      No results found for any visits on 02/11/23.    Assessment & Plan:  Acute suppurative otitis media of right ear without spontaneous rupture of tympanic membrane, recurrence not specified -Failed Augmentin course.  Start doxycycline.  Given precautions.  Okay to continue Flonase nasal spray.  Consider switching from Xyzal to a different allergy medication for a while. -     Doxycycline Hyclate; Take 1 tablet (100 mg total) by mouth 2 (two) times daily for 7 days.  Dispense: 14 tablet; Refill: 0  Stress -PHQ-9 score 9 this visit.  Previously 9. -GAD-7 score 15, previously 7.  Slightly increased due to increased stress. -Continue Zoloft 50 mg daily. -Self-care advised -Consider increasing dose if needed for  continued or worsening symptoms.  Dysfunction of right eustachian tube -Switch antihistamine.  Continue Flonase nasal spray   Return if symptoms worsen or fail to improve.   Billie Ruddy, MD

## 2023-03-04 ENCOUNTER — Telehealth: Payer: Self-pay | Admitting: Pulmonary Disease

## 2023-03-04 ENCOUNTER — Ambulatory Visit (INDEPENDENT_AMBULATORY_CARE_PROVIDER_SITE_OTHER): Payer: Managed Care, Other (non HMO)

## 2023-03-04 DIAGNOSIS — J4520 Mild intermittent asthma, uncomplicated: Secondary | ICD-10-CM | POA: Diagnosis not present

## 2023-03-05 NOTE — Telephone Encounter (Signed)
Order for sleep test has been ordered. Sleep lab will call patient to get test scheduled. Will close encounter.

## 2023-03-11 ENCOUNTER — Telehealth: Payer: Self-pay | Admitting: Pulmonary Disease

## 2023-03-11 ENCOUNTER — Other Ambulatory Visit: Payer: Self-pay

## 2023-03-11 DIAGNOSIS — J4520 Mild intermittent asthma, uncomplicated: Secondary | ICD-10-CM

## 2023-03-11 NOTE — Telephone Encounter (Signed)
I replaced the order. Should be correct

## 2023-03-11 NOTE — Telephone Encounter (Signed)
The order is not correct it reads as a regular PFT

## 2023-03-11 NOTE — Telephone Encounter (Signed)
Lori Mann has called to set up the test at [phone]

## 2023-03-11 NOTE — Telephone Encounter (Signed)
On last AVS Dr. Jenetta Downer stated the following:  Pulmonary function test with methacholine challenge     Please call PT to schedule and advise advise @ [phone]  She has FU appt w/Dr. Jenetta Downer on 5/6.

## 2023-03-25 ENCOUNTER — Telehealth: Payer: Self-pay | Admitting: Pulmonary Disease

## 2023-03-25 NOTE — Telephone Encounter (Signed)
Pt last seen on 01/23/23. She has been trying for a month and a half to schedule her PFT which has to be done at the hospital due to the meth challenge. States she has not heard from anyone regarding that nor her sleep study. Both orders have been placed. Please advise and call pt to schedule.

## 2023-03-25 NOTE — Telephone Encounter (Signed)
Sleep study order was placed on 2/2 and Methacholine Challenge order placed on 3/20.  Sleep study has to be authorized before can be scheduled.  I called [facility] on 3/20 & left vm for pt to be scheduled for Methacholine Challenge and have called today and left another vm for them.  Will route to [facility] to check status of inlab study auth.

## 2023-03-30 NOTE — Telephone Encounter (Signed)
Left another vm today for [facility] for Methacholine Challenge.  Will route to [facility] to check on inlab study auth.

## 2023-03-30 NOTE — Telephone Encounter (Signed)
Pt called the office stating that she still has not heard about having PFT scheduled which is to be done at the hospital due to her needing to have methacholine challenge performed. Pt checking on a status update.  Pt has a follow up appt scheduled 5/6 with our office.  PCCs, please advise.

## 2023-04-09 NOTE — Telephone Encounter (Signed)
Lori Mann, Lori C  Mann, Lori Mann, [place] D10 days ago   SB THIS REQUEST WAS SUBMITTED BY SOMEONE (NOT ME) ON 03/27/23 CASE[id number][facility]    Please advise if there is any  update on when pt might be scheduled to have this performed. Pt has an upcoming appt with the office 5/6.

## 2023-04-21 ENCOUNTER — Telehealth: Payer: Self-pay | Admitting: Pulmonary Disease

## 2023-04-21 NOTE — Telephone Encounter (Signed)
Patient would like the nurse to call her regarding an appt. She said she has at the hospital.  Patient seems to be confused as to where she needs to have her PFT.  Please call patient to explain.  CB# [phone]

## 2023-04-24 NOTE — Telephone Encounter (Signed)
Called the pt and there was no answer- LMTCB    

## 2023-04-27 ENCOUNTER — Ambulatory Visit (HOSPITAL_COMMUNITY)
Admission: RE | Admit: 2023-04-27 | Discharge: 2023-04-27 | Disposition: A | Discharge status: Home / Self Care | Payer: Managed Care, Other (non HMO) | Source: Ambulatory Visit | Attending: Pulmonary Disease | Admitting: Pulmonary Disease

## 2023-04-27 ENCOUNTER — Ambulatory Visit: Payer: Managed Care, Other (non HMO) | Admitting: Pulmonary Disease

## 2023-04-27 DIAGNOSIS — J4520 Mild intermittent asthma, uncomplicated: Secondary | ICD-10-CM | POA: Diagnosis not present

## 2023-04-27 LAB — PULMONARY FUNCTION TEST
DL/VA % pred: 136 %
DL/VA: 6.08 ml/min/mmHg/L
DLCO unc % pred: 105 %
DLCO unc: 25.1 ml/min/mmHg
FEF 25-75 Post: 2.89 L/sec
FEF 25-75 Pre: 1.81 L/sec
FEF2575-%Change-Post: 59 %
FEF2575-%Pred-Post: 83 %
FEF2575-%Pred-Pre: 52 %
FEV1-%Change-Post: 14 %
FEV1-%Pred-Post: 84 %
FEV1-%Pred-Pre: 73 %
FEV1-Post: 2.8 L
FEV1-Pre: 2.45 L
FEV1FVC-%Change-Post: 7 %
FEV1FVC-%Pred-Pre: 86 %
FEV6-%Change-Post: 5 %
FEV6-%Pred-Post: 90 %
FEV6-%Pred-Pre: 85 %
FEV6-Post: 3.57 L
FEV6-Pre: 3.38 L
FEV6FVC-%Change-Post: 0 %
FEV6FVC-%Pred-Post: 101 %
FEV6FVC-%Pred-Pre: 101 %
FVC-%Change-Post: 5 %
FVC-%Pred-Post: 89 %
FVC-%Pred-Pre: 84 %
FVC-Post: 3.58 L
FVC-Pre: 3.38 L
Post FEV1/FVC ratio: 78 %
Post FEV6/FVC ratio: 100 %
Pre FEV1/FVC ratio: 73 %
Pre FEV6/FVC Ratio: 100 %
RV % pred: 163 %
RV: 2.57 L
TLC % pred: 106 %
TLC: 5.7 L

## 2023-04-27 MED ORDER — ALBUTEROL SULFATE (2.5 MG/3ML) 0.083% IN NEBU
2.5000 mg | INHALATION_SOLUTION | Freq: Once | RESPIRATORY_TRACT | Status: AC
  Administered 2023-04-27: 2.5 mg via RESPIRATORY_TRACT

## 2023-05-06 ENCOUNTER — Encounter: Payer: Self-pay | Admitting: Pulmonary Disease

## 2023-05-06 ENCOUNTER — Ambulatory Visit: Payer: Managed Care, Other (non HMO) | Admitting: Pulmonary Disease

## 2023-05-06 VITALS — BP 110/78 | HR 89 | Ht 66.0 in | Wt 216.0 lb

## 2023-05-06 DIAGNOSIS — Z87898 Personal history of other specified conditions: Secondary | ICD-10-CM

## 2023-05-06 NOTE — Patient Instructions (Signed)
Schedule for home sleep study  PFT shows hyperactivity in your airway -Continue Breo -Albuterol as needed  For your severe sleepiness -Continue using Ritalin as needed  Continue weight loss efforts  Follow-up in 6 months

## 2023-05-06 NOTE — Progress Notes (Signed)
Lori Mann    [id number]    [birth year]  Primary Care Physician:Banks, Bettey Mare, MD  Referring Physician: Deeann Saint, MD [address] [place],  [place] [address]  Chief complaint:   Patient with a history of idiopathic hypersomnolence  HPI:  She was last seen about 2022 for excessive daytime sleepiness  Sleep study and MSLT in 2017 was consistent with idiopathic hypersomnolence  Recently had a bronchitis in August and followed by COVID infection Since then she has been coughing more, wheezing more, need for rescue inhaler which she is using up to 4 times a day Activity level is also decreased over that time.  Still has a lot of sleepiness during the day Uses Ritalin as needed on the days that she is going into work  Airway hyperactivity evaluated with a PFT showing significant bronchodilator response -Has been using Breo -Feels her breathing feels about the same -Albuterol does feel like it helps better  She admits to some snoring, waking up wheezing sometimes Dryness of her mouth in the mornings Very rare headaches Usually goes to bed about 11, wake up time about 8 AM  Did not tolerate Provigil in the past, was unable to sleep for many days  Tried different medications but Ritalin was the only 1 she was able to tolerate and worked well  Weight is stable  Outpatient Encounter Medications as of 05/06/2023  Medication Sig   albuterol (VENTOLIN HFA) 108 (90 Base) MCG/ACT inhaler 2 PUFFS INHALATION EVERY 4-6 HRS AS NEEDED COUGH/WHEEZE/1/2 HOUR PRIOR TO EXERCISE 90 DAYS   drospirenone-ethinyl estradiol (YAZ,GIANVI,LORYNA) 3-0.02 MG tablet Take 1 tablet by mouth daily.   EPINEPHrine 0.3 mg/0.3 mL IJ SOAJ injection Inject 0.3 mg into the muscle once as needed (allergic reaction).    fluticasone furoate-vilanterol (BREO ELLIPTA) 200-25 MCG/ACT AEPB Inhale 1 puff into the lungs daily.   levocetirizine (XYZAL) 5 MG tablet Take 5 mg by mouth daily.    methylphenidate (RITALIN) 10 MG tablet Take 1 tablet (10 mg total) by mouth 2 (two) times daily.   Olopatadine HCl 0.2 % SOLN olopatadine 0.2 % eye drops  INSTILL ONE DROP INTO BOTH EYES EVERY DAY   sertraline (ZOLOFT) 50 MG tablet Take 1 tablet (50 mg total) by mouth daily.   Spacer/Aero-Holding Chambers (E-Z SPACER) inhaler Use as instructed   [DISCONTINUED] BREO ELLIPTA 200-25 MCG/ACT AEPB Inhale 1 puff into the lungs daily.   No facility-administered encounter medications on file as of 05/06/2023.    Allergies as of 05/06/2023 - Review Complete 05/06/2023  Allergen Reaction Noted   Nickel Rash 06/21/2018   Benzoyl peroxide Rash 06/21/2018    Past Medical History:  Diagnosis Date   Allergic rhinitis    Allergy    Asthma    H/O infectious mononucleosis    IBS (irritable bowel syndrome)    Kidney stones    Lumbar herniated disc    Narcolepsy without cataplexy(347.00)     Past Surgical History:  Procedure Laterality Date   CYSTOSCOPY WITH RETROGRADE PYELOGRAM, URETEROSCOPY AND STENT PLACEMENT Right 10/11/2015   Procedure: CYSTOSCOPY WITH RETROGRADE PYELOGRAM, URETEROSCOPY,STONE EXTRACTIONAND STENT PLACEMENT;  Surgeon: Marcine Matar, MD;  Location: [facility];  Service: Urology;  Laterality: Right;   HOLMIUM LASER APPLICATION Right 10/11/2015   Procedure: HOLMIUM LASER APPLICATION;  Surgeon: Marcine Matar, MD;  Location: [facility];  Service: Urology;  Laterality: Right;   NASAL SEPTUM SURGERY  TONSILLECTOMY      Family History  Problem Relation Age of Onset   Cancer Mother        breast ca   Arthritis Father    Hyperlipidemia Father    Irritable bowel syndrome Sister     Social History   Socioeconomic History   Marital status: Married    Spouse name: Not on file   Number of children: Not on file   Years of education: Not on file   Highest education level: Master's degree (e.g., MA, MS, MEng, MEd, MSW, MBA)  Occupational  History   Occupation: Marketing  Tobacco Use   Smoking status: Never   Smokeless tobacco: Never  Vaping Use   Vaping Use: Never used  Substance and Sexual Activity   Alcohol use: Yes    Alcohol/week: 0.0 standard drinks of alcohol   Drug use: No   Sexual activity: Yes    Birth control/protection: Pill  Other Topics Concern   Not on file  Social History Narrative   Lives with husband   Social Determinants of Health   Financial Resource Strain: Low Risk  (07/09/2022)   Overall Financial Resource Strain (CARDIA)    Difficulty of Paying Living Expenses: Not hard at all  Food Insecurity: No Food Insecurity (07/09/2022)   Hunger Vital Sign    Worried About Running Out of Food in the Last Year: Never true    Ran Out of Food in the Last Year: Never true  Transportation Needs: No Transportation Needs (07/09/2022)   PRAPARE - Administrator, Civil Service (Medical): No    Lack of Transportation (Non-Medical): No  Physical Activity: Insufficiently Active (07/09/2022)   Exercise Vital Sign    Days of Exercise per Week: 3 days    Minutes of Exercise per Session: 30 min  Stress: Stress Concern Present (07/09/2022)   [employer] of Occupational Health - Occupational Stress Questionnaire    Feeling of Stress : Very much  Social Connections: Unknown (07/09/2022)   Social Connection and Isolation Panel [NHANES]    Frequency of Communication with Friends and Family: More than three times a week    Frequency of Social Gatherings with Friends and Family: Once a week    Attends Religious Services: Patient declined    Database administrator or Organizations: No    Attends Engineer, structural: Not on file    Marital Status: Married  Catering manager Violence: Not on file    Review of Systems  Constitutional:  Negative for fatigue.  Respiratory:  Positive for wheezing. Negative for cough and shortness of breath.   Psychiatric/Behavioral:  Negative for sleep  disturbance.     Vitals:   05/06/23 1013  Pulse: 89  SpO2: 95%   Physical Exam Constitutional:      Appearance: She is obese.  HENT:     Head: Normocephalic and atraumatic.     Nose: No congestion or rhinorrhea.     Mouth/Throat:     Mouth: Mucous membranes are moist.  Eyes:     General: No scleral icterus.    Pupils: Pupils are equal, round, and reactive to light.  Cardiovascular:     Rate and Rhythm: Normal rate and regular rhythm.     Heart sounds: No murmur heard.    No friction rub.  Pulmonary:     Effort: No respiratory distress.     Breath sounds: No stridor. No wheezing or rhonchi.  Musculoskeletal:     Cervical back:  No rigidity or tenderness.  Neurological:     Mental Status: She is alert.  Psychiatric:        Mood and Affect: Mood normal.   Epworth Sleepiness Scale of 13     05/29/2021   11:00 AM  Results of the Epworth flowsheet  Sitting and reading 3  Watching TV 2  Sitting, inactive in a public place (e.g. a theatre or a meeting) 0  As a passenger in a car for an hour without a break 3  Lying down to rest in the afternoon when circumstances permit 3  Sitting and talking to someone 0  Sitting quietly after a lunch without alcohol 2  In a car, while stopped for a few minutes in traffic 0  Total score 13    Data Reviewed: Sleep study from 05/28/2016 reviewed -Negative for significant sleep disordered breathing -MSLT consistent with idiopathic hypersomnolence  Previous notes from Dr. Craige Cotta reviewed  PFT reviewed showing airway hyperresponsiveness  Assessment:  Idiopathic hypersomnolence -Continue Ritalin as needed  Since COVID infection -Does have more fatigue, more shortness of breath -Continue Breo  Class I obesity -Continues to work on weight loss efforts  Possible worsening of sleep disordered breathing, we did talk about repeating a sleep study -In lab study was denied -Will schedule for a home sleep study -Previous study had shown  mild obstructive sleep apnea -Has gained some weight -Daytime sleepiness is multifactorial -Does have constant snoring  Airway hyperresponsiveness with significant bronchodilator response Mild persistent asthma -Will continue Breo  Plan/Recommendations: Continue Breo Schedule for home sleep study Continue albuterol as needed  Encourage weight loss efforts  Tentative follow-up in 6 months     Virl Diamond MD [facility] 05/06/2023, 10:15 AM  CC: Deeann Saint, MD

## 2023-05-25 ENCOUNTER — Other Ambulatory Visit (HOSPITAL_COMMUNITY): Payer: Self-pay

## 2023-05-25 ENCOUNTER — Ambulatory Visit (INDEPENDENT_AMBULATORY_CARE_PROVIDER_SITE_OTHER): Payer: Managed Care, Other (non HMO) | Admitting: Family Medicine

## 2023-05-25 ENCOUNTER — Encounter: Payer: Self-pay | Admitting: Family Medicine

## 2023-05-25 VITALS — BP 106/82 | HR 90 | Temp 98.0°F | Ht 66.0 in | Wt 214.6 lb

## 2023-05-25 DIAGNOSIS — Z0000 Encounter for general adult medical examination without abnormal findings: Secondary | ICD-10-CM

## 2023-05-25 DIAGNOSIS — Z6834 Body mass index (BMI) 34.0-34.9, adult: Secondary | ICD-10-CM | POA: Diagnosis not present

## 2023-05-25 DIAGNOSIS — J4520 Mild intermittent asthma, uncomplicated: Secondary | ICD-10-CM | POA: Diagnosis not present

## 2023-05-25 DIAGNOSIS — R5383 Other fatigue: Secondary | ICD-10-CM | POA: Diagnosis not present

## 2023-05-25 DIAGNOSIS — E669 Obesity, unspecified: Secondary | ICD-10-CM

## 2023-05-25 LAB — CBC WITH DIFFERENTIAL/PLATELET
Basophils Absolute: 0 10*3/uL (ref 0.0–0.1)
Basophils Relative: 0.4 % (ref 0.0–3.0)
Eosinophils Absolute: 0.6 10*3/uL (ref 0.0–0.7)
Eosinophils Relative: 6.3 % — ABNORMAL HIGH (ref 0.0–5.0)
HCT: 39.1 % (ref 36.0–46.0)
Hemoglobin: 13 g/dL (ref 12.0–15.0)
Lymphocytes Relative: 39.7 % (ref 12.0–46.0)
Lymphs Abs: 3.6 10*3/uL (ref 0.7–4.0)
MCHC: 33.3 g/dL (ref 30.0–36.0)
MCV: 89 fl (ref 78.0–100.0)
Monocytes Absolute: 0.4 10*3/uL (ref 0.1–1.0)
Monocytes Relative: 4.5 % (ref 3.0–12.0)
Neutro Abs: 4.5 10*3/uL (ref 1.4–7.7)
Neutrophils Relative %: 49.1 % (ref 43.0–77.0)
Platelets: 355 10*3/uL (ref 150.0–400.0)
RBC: 4.39 Mil/uL (ref 3.87–5.11)
RDW: 12.9 % (ref 11.5–15.5)
WBC: 9.1 10*3/uL (ref 4.0–10.5)

## 2023-05-25 LAB — COMPREHENSIVE METABOLIC PANEL
ALT: 33 U/L (ref 0–35)
AST: 25 U/L (ref 0–37)
Albumin: 4 g/dL (ref 3.5–5.2)
Alkaline Phosphatase: 36 U/L — ABNORMAL LOW (ref 39–117)
BUN: 12 mg/dL (ref 6–23)
CO2: 25 mEq/L (ref 19–32)
Calcium: 9.4 mg/dL (ref 8.4–10.5)
Chloride: 102 mEq/L (ref 96–112)
Creatinine, Ser: 0.73 mg/dL (ref 0.40–1.20)
GFR: 107.48 mL/min (ref >60.00)
Glucose, Bld: 104 mg/dL — ABNORMAL HIGH (ref 70–99)
Potassium: 3.9 mEq/L (ref 3.5–5.1)
Sodium: 138 mEq/L (ref 135–145)
Total Bilirubin: 0.3 mg/dL (ref 0.2–1.2)
Total Protein: 7.6 g/dL (ref 6.0–8.3)

## 2023-05-25 LAB — LIPID PANEL
Cholesterol: 158 mg/dL (ref 0–200)
HDL: 52.7 mg/dL (ref >39.00)
NonHDL: 105.04
Total CHOL/HDL Ratio: 3
Triglycerides: 275 mg/dL — ABNORMAL HIGH (ref 0.0–149.0)
VLDL: 55 mg/dL — ABNORMAL HIGH (ref 0.0–40.0)

## 2023-05-25 LAB — HEMOGLOBIN A1C: Hgb A1c MFr Bld: 5.6 % (ref 4.6–6.5)

## 2023-05-25 LAB — VITAMIN D 25 HYDROXY (VIT D DEFICIENCY, FRACTURES): VITD: 33.02 ng/mL (ref 30.00–100.00)

## 2023-05-25 LAB — TSH: TSH: 3.33 u[IU]/mL (ref 0.35–5.50)

## 2023-05-25 LAB — T4, FREE: Free T4: 0.8 ng/dL (ref 0.60–1.60)

## 2023-05-25 LAB — LDL CHOLESTEROL, DIRECT: Direct LDL: 72 mg/dL

## 2023-05-25 MED ORDER — WEGOVY 0.5 MG/0.5ML ~~LOC~~ SOAJ
0.5000 mg | SUBCUTANEOUS | 1 refills | Status: DC
Start: 2023-06-24 — End: 2024-06-09
  Filled 2023-05-25: qty 2, 28d supply, fill #0

## 2023-05-25 MED ORDER — SEMAGLUTIDE-WEIGHT MANAGEMENT 0.25 MG/0.5ML ~~LOC~~ SOAJ
0.2500 mg | SUBCUTANEOUS | 0 refills | Status: DC
Start: 2023-05-25 — End: 2024-06-09
  Filled 2023-05-25: qty 2, 28d supply, fill #0

## 2023-05-25 NOTE — Progress Notes (Signed)
Established Patient Office Visit   Subjective  Patient ID: Lori Mann, female    DOB: 01/27/1991  Age: 32 y.o. MRN: [id number]  Chief Complaint  Patient presents with   Annual Exam    T4 levels have been low, hypothyroidism, wants to know if there is another test that can be done. Possible hormone pannel. Seen pulm. Just wants to know why she is always fatigued and not feeling her best.     Patient is a 32 year old female seen for CPE and follow-up on ongoing concerns.  Patient inquires if she has hypothyroidism after looking up symptoms online.  Patient endorses feeling cold all the time, brittle hair, dry skin, brittle nails, difficulty losing weight.  Last year patient was on Wegovy through an online company.  Would like to restart.  Did not have any GI issues with the medication but stopped 2/2 supply issues.  Patient seen by pulmonology.  Recently diagnosed with asthma after PFTs.  Played sports growing up but never had issues with wheezing.  Had an at home sleep study, awaiting results.  Seen by allergist for allergy shots.   Past Medical History:  Diagnosis Date   Allergic rhinitis    Allergy    Asthma    H/O infectious mononucleosis    IBS (irritable bowel syndrome)    Kidney stones    Lumbar herniated disc    Narcolepsy without cataplexy(347.00)    Past Surgical History:  Procedure Laterality Date   CYSTOSCOPY WITH RETROGRADE PYELOGRAM, URETEROSCOPY AND STENT PLACEMENT Right 10/11/2015   Procedure: CYSTOSCOPY WITH RETROGRADE PYELOGRAM, URETEROSCOPY,STONE EXTRACTIONAND STENT PLACEMENT;  Surgeon: Marcine Matar, MD;  Location: [facility];  Service: Urology;  Laterality: Right;   HOLMIUM LASER APPLICATION Right 10/11/2015   Procedure: HOLMIUM LASER APPLICATION;  Surgeon: Marcine Matar, MD;  Location: [facility]  Service: Urology;  Laterality: Right;   NASAL SEPTUM SURGERY     TONSILLECTOMY     Social History   Tobacco Use    Smoking status: Never   Smokeless tobacco: Never  Vaping Use   Vaping Use: Never used  Substance Use Topics   Alcohol use: Yes    Alcohol/week: 0.0 standard drinks of alcohol   Drug use: No   Family History  Problem Relation Age of Onset   Cancer Mother        breast ca   Arthritis Father    Hyperlipidemia Father    Irritable bowel syndrome Sister    Allergies  Allergen Reactions   Nickel Rash    Burning rash  Burning rash, Burning rash   Benzoyl Peroxide Rash      ROS Negative unless stated above    Objective:     BP 106/82 (BP Location: Right Arm, Patient Position: Sitting, Cuff Size: Large)   Pulse 90   Temp 98 F (36.7 C) (Oral)   Ht 5\' 6"  (1.676 m)   Wt 214 lb 9.6 oz (97.3 kg)   SpO2 95%   BMI 34.64 kg/m    Physical Exam Constitutional:      Appearance: Normal appearance.  HENT:     Head: Normocephalic and atraumatic.     Right Ear: Tympanic membrane, ear canal and external ear normal.     Left Ear: Tympanic membrane, ear canal and external ear normal.     Nose: Nose normal.     Mouth/Throat:     Mouth: Mucous membranes are moist.     Pharynx: No oropharyngeal exudate or  posterior oropharyngeal erythema.  Eyes:     General: No scleral icterus.    Extraocular Movements: Extraocular movements intact.     Conjunctiva/sclera: Conjunctivae normal.     Pupils: Pupils are equal, round, and reactive to light.  Neck:     Thyroid: No thyromegaly.  Cardiovascular:     Rate and Rhythm: Normal rate and regular rhythm.     Pulses: Normal pulses.     Heart sounds: Normal heart sounds. No murmur heard.    No friction rub.  Pulmonary:     Effort: Pulmonary effort is normal.     Breath sounds: Normal breath sounds. No wheezing, rhonchi or rales.  Abdominal:     General: Bowel sounds are normal.     Palpations: Abdomen is soft.     Tenderness: There is no abdominal tenderness.  Musculoskeletal:        General: No deformity. Normal range of motion.   Lymphadenopathy:     Cervical: No cervical adenopathy.  Skin:    General: Skin is warm and dry.     Findings: No lesion.  Neurological:     General: No focal deficit present.     Mental Status: She is alert and oriented to person, place, and time.  Psychiatric:        Mood and Affect: Mood normal.        Thought Content: Thought content normal.     No results found for any visits on 05/25/23.    Assessment & Plan:  Well adult exam -Age-appropriate health screenings discussed -Pap done 10/13/2019.  Repeat due 2025 -Immunizations reviewed. -Next CPE in 1 yr. -     CBC with Differential/Platelet -     TSH -     T4, free -     Hemoglobin A1c -     Lipid panel -     Comprehensive metabolic panel  Mild intermittent asthma without complication -Breo Ellipta inhaler daily. -Continue follow-up with pulmonology  Class 1 obesity with serious comorbidity and body mass index (BMI) of 34.0 to 34.9 in adult, unspecified obesity type -Body mass index is 34.64 kg/m. -Continue lifestyle modifications -Awaiting results of at home sleep study -Restart Wegovy 0.25 mg weekly x 1 month then increase to 0.5 mg weekly -     CBC with Differential/Platelet -     TSH -     T4, free -     Hemoglobin A1c -     Lipid panel -     Comprehensive metabolic panel -     VITAMIN D 25 Hydroxy (Vit-D Deficiency, Fractures) -     Semaglutide-Weight Management; Inject 0.25 mg into the skin once a week.  Dispense: 2 mL; Refill: 0 -     Wegovy; Inject 0.5 mg into the skin once a week.  Dispense: 2 mL; Refill: 1  Fatigue, unspecified type -Awaiting results of at home sleep study -     CBC with Differential/Platelet -     TSH -     T4, free -     Comprehensive metabolic panel -     Iron, TIBC and Ferritin Panel   Return in about 4 weeks (around 06/22/2023).   Deeann Saint, MD

## 2023-05-26 ENCOUNTER — Encounter: Payer: Self-pay | Admitting: Family Medicine

## 2023-05-26 LAB — IRON,TIBC AND FERRITIN PANEL
%SAT: 23 % (calc) (ref 16–45)
Ferritin: 74 ng/mL (ref 16–154)
Iron: 97 ug/dL (ref 40–190)
TIBC: 425 mcg/dL (calc) (ref 250–450)

## 2023-05-31 ENCOUNTER — Encounter (INDEPENDENT_AMBULATORY_CARE_PROVIDER_SITE_OTHER): Payer: Managed Care, Other (non HMO)

## 2023-05-31 ENCOUNTER — Telehealth: Payer: Self-pay | Admitting: Pulmonary Disease

## 2023-05-31 DIAGNOSIS — G4733 Obstructive sleep apnea (adult) (pediatric): Secondary | ICD-10-CM | POA: Diagnosis not present

## 2023-05-31 DIAGNOSIS — Z87898 Personal history of other specified conditions: Secondary | ICD-10-CM

## 2023-05-31 NOTE — Telephone Encounter (Signed)
Call patient  Sleep study result  Date of study: 05/19/2023  Impression: Severe obstructive sleep apnea Moderate oxygen desaturations  Recommendation: DME referral  Recommend CPAP therapy for moderate obstructive sleep apnea  Auto titrating CPAP with pressure settings of 5-20 will be appropriate  Encourage weight loss measures  Follow-up in the office 4 to 6 weeks following initiation of treatment

## 2023-06-01 ENCOUNTER — Other Ambulatory Visit: Payer: Self-pay | Admitting: Family Medicine

## 2023-06-01 DIAGNOSIS — E781 Pure hyperglyceridemia: Secondary | ICD-10-CM

## 2023-06-01 NOTE — Telephone Encounter (Signed)
ATC x1 LVM for patient to call  our office back regarding sleep study result's.  

## 2023-06-02 ENCOUNTER — Telehealth: Payer: Self-pay | Admitting: Family Medicine

## 2023-06-02 NOTE — Telephone Encounter (Signed)
Patient is returning a call regarding her sleep results.  Please call patient back at [phone]

## 2023-06-02 NOTE — Telephone Encounter (Signed)
Pt returned call for results.

## 2023-06-02 NOTE — Telephone Encounter (Signed)
Already spoke to pt. See lab result.

## 2023-06-04 ENCOUNTER — Other Ambulatory Visit (HOSPITAL_COMMUNITY): Payer: Self-pay

## 2023-06-17 ENCOUNTER — Ambulatory Visit: Payer: Managed Care, Other (non HMO) | Admitting: Family Medicine

## 2023-06-17 VITALS — BP 108/74 | HR 96 | Temp 98.7°F | Wt 217.4 lb

## 2023-06-17 DIAGNOSIS — R631 Polydipsia: Secondary | ICD-10-CM

## 2023-06-17 DIAGNOSIS — K580 Irritable bowel syndrome with diarrhea: Secondary | ICD-10-CM | POA: Diagnosis not present

## 2023-06-17 NOTE — Telephone Encounter (Signed)
I read results to PT listed below by Dr. Val Eagle. Please call about next steps. She states she has a CPAP she used to use. Can she use it again?   Best call back is [phone]  Sending to Triage and PCC's as not sure who takes the baton.

## 2023-06-17 NOTE — Progress Notes (Signed)
   Established Patient Office Visit   Subjective  Patient ID: Lori Mann, female    DOB: Jul 11, 1991  Age: 32 y.o. MRN: [id number]  Chief Complaint  Patient presents with  . upset stomach    After eats has to always be near bathroom, going on for a while. It is either liqiud, mush or constipated. Always thirsty, constant HA. A month ago it was every 15 minutes.  Tummy is always hurting, when she has to go, she has to get there in a minute. Wants to get checked to maybe start having normal BM.     HPI  {History (Optional):23778}  ROS Negative unless stated above    Objective:     BP 108/74 (BP Location: Left Arm, Patient Position: Sitting, Cuff Size: Large)   Pulse 96   Temp 98.7 F (37.1 C) (Oral)   Wt 217 lb 6.4 oz (98.6 kg)   SpO2 97%   BMI 35.09 kg/m  {Vitals History (Optional):23777}  Physical Exam   No results found for any visits on 06/17/23.    Assessment & Plan:  Irritable bowel syndrome with diarrhea -     Ambulatory referral to Gastroenterology    No follow-ups on file.   Deeann Saint, MD

## 2023-06-17 NOTE — Telephone Encounter (Signed)
Called and spoke with patient.  Dr. Trena Platt recommendations given.  Understanding stated. Patient has a cpap prescribed from 2016, but is not currently using it.  DME order has been placed.  Nothing further at this time.

## 2023-06-18 ENCOUNTER — Encounter: Payer: Self-pay | Admitting: Family Medicine

## 2023-08-04 ENCOUNTER — Other Ambulatory Visit: Payer: Self-pay | Admitting: Family Medicine

## 2023-08-04 DIAGNOSIS — F411 Generalized anxiety disorder: Secondary | ICD-10-CM

## 2023-08-04 DIAGNOSIS — F320 Major depressive disorder, single episode, mild: Secondary | ICD-10-CM

## 2023-08-07 ENCOUNTER — Encounter: Payer: Self-pay | Admitting: Family Medicine

## 2023-08-07 ENCOUNTER — Ambulatory Visit: Payer: Managed Care, Other (non HMO) | Admitting: Family Medicine

## 2023-08-07 VITALS — BP 104/78 | HR 105 | Temp 98.4°F | Wt 214.0 lb

## 2023-08-07 DIAGNOSIS — R059 Cough, unspecified: Secondary | ICD-10-CM | POA: Diagnosis not present

## 2023-08-07 DIAGNOSIS — J019 Acute sinusitis, unspecified: Secondary | ICD-10-CM | POA: Diagnosis not present

## 2023-08-07 LAB — POC COVID19 BINAXNOW: SARS Coronavirus 2 Ag: NEGATIVE

## 2023-08-07 MED ORDER — METHYLPREDNISOLONE 4 MG PO TBPK: ORAL_TABLET | ORAL | 0 refills | Status: DC

## 2023-08-07 MED ORDER — AZITHROMYCIN 250 MG PO TABS: ORAL_TABLET | ORAL | 0 refills | Status: DC

## 2023-08-07 NOTE — Progress Notes (Signed)
   Subjective:    Patient ID: Lori Mann, female    DOB: 01/21/1991, 32 y.o.   MRN: [id number]  HPI Here for 5 days of sinus congestion, ear pains, PND, and a dry cough. No fever or ST. Taking Alka Seltzer Cold and Flu. Using her inhalers.    Review of Systems  Constitutional: Negative.   HENT:  Positive for congestion, ear pain, postnasal drip and sinus pressure. Negative for sore throat.   Eyes: Negative.   Respiratory:  Positive for cough. Negative for shortness of breath and wheezing.        Objective:   Physical Exam Constitutional:      Appearance: Normal appearance.  HENT:     Right Ear: Tympanic membrane, ear canal and external ear normal.     Left Ear: Tympanic membrane, ear canal and external ear normal.     Nose: Nose normal.     Mouth/Throat:     Pharynx: Oropharynx is clear.  Eyes:     Conjunctiva/sclera: Conjunctivae normal.  Pulmonary:     Effort: Pulmonary effort is normal.     Breath sounds: Normal breath sounds.  Lymphadenopathy:     Cervical: No cervical adenopathy.  Neurological:     Mental Status: She is alert.           Assessment & Plan:  Sinusitis, treat with a Zpack and a Medrol dose pack. Gershon Crane, MD

## 2023-11-18 ENCOUNTER — Other Ambulatory Visit: Payer: Self-pay

## 2023-11-18 ENCOUNTER — Emergency Department (HOSPITAL_COMMUNITY)
Admission: EM | Admit: 2023-11-18 | Discharge: 2023-11-19 | Disposition: A | Discharge status: Home / Self Care | Payer: Managed Care, Other (non HMO) | Attending: Emergency Medicine | Admitting: Emergency Medicine

## 2023-11-18 ENCOUNTER — Encounter (HOSPITAL_COMMUNITY): Payer: Self-pay

## 2023-11-18 ENCOUNTER — Emergency Department (HOSPITAL_COMMUNITY): Payer: Managed Care, Other (non HMO)

## 2023-11-18 DIAGNOSIS — R7401 Elevation of levels of liver transaminase levels: Secondary | ICD-10-CM | POA: Insufficient documentation

## 2023-11-18 DIAGNOSIS — E860 Dehydration: Secondary | ICD-10-CM

## 2023-11-18 DIAGNOSIS — R112 Nausea with vomiting, unspecified: Secondary | ICD-10-CM | POA: Diagnosis present

## 2023-11-18 DIAGNOSIS — R197 Diarrhea, unspecified: Secondary | ICD-10-CM | POA: Insufficient documentation

## 2023-11-18 DIAGNOSIS — J45909 Unspecified asthma, uncomplicated: Secondary | ICD-10-CM | POA: Diagnosis not present

## 2023-11-18 DIAGNOSIS — R739 Hyperglycemia, unspecified: Secondary | ICD-10-CM | POA: Diagnosis not present

## 2023-11-18 DIAGNOSIS — Z7951 Long term (current) use of inhaled steroids: Secondary | ICD-10-CM | POA: Insufficient documentation

## 2023-11-18 DIAGNOSIS — D72829 Elevated white blood cell count, unspecified: Secondary | ICD-10-CM | POA: Diagnosis not present

## 2023-11-18 LAB — CBC
HCT: 43.1 % (ref 36.0–46.0)
Hemoglobin: 14.6 g/dL (ref 12.0–15.0)
MCH: 30.4 pg (ref 26.0–34.0)
MCHC: 33.9 g/dL (ref 30.0–36.0)
MCV: 89.8 fL (ref 80.0–100.0)
Platelets: 378 10*3/uL (ref 150–400)
RBC: 4.8 MIL/uL (ref 3.87–5.11)
RDW: 12.3 % (ref 11.5–15.5)
WBC: 12.1 10*3/uL — ABNORMAL HIGH (ref 4.0–10.5)
nRBC: 0 % (ref 0.0–0.2)

## 2023-11-18 LAB — COMPREHENSIVE METABOLIC PANEL
ALT: 45 U/L — ABNORMAL HIGH (ref 0–44)
AST: 24 U/L (ref 15–41)
Albumin: 4.2 g/dL (ref 3.5–5.0)
Alkaline Phosphatase: 50 U/L (ref 38–126)
Anion gap: 12 (ref 5–15)
BUN: 16 mg/dL (ref 6–20)
CO2: 22 mmol/L (ref 22–32)
Calcium: 9 mg/dL (ref 8.9–10.3)
Chloride: 102 mmol/L (ref 98–111)
Creatinine, Ser: 0.92 mg/dL (ref 0.44–1.00)
GFR, Estimated: >60 mL/min (ref >60)
Glucose, Bld: 143 mg/dL — ABNORMAL HIGH (ref 70–99)
Potassium: 3.5 mmol/L (ref 3.5–5.1)
Sodium: 136 mmol/L (ref 135–145)
Total Bilirubin: 0.5 mg/dL (ref <1.2)
Total Protein: 8 g/dL (ref 6.5–8.1)

## 2023-11-18 LAB — LIPASE, BLOOD: Lipase: 23 U/L (ref 11–51)

## 2023-11-18 LAB — HCG, QUANTITATIVE, PREGNANCY: hCG, Beta Chain, Quant, S: <1 m[IU]/mL (ref <5)

## 2023-11-18 MED ORDER — IOHEXOL 300 MG/ML  SOLN
100.0000 mL | Freq: Once | INTRAMUSCULAR | Status: AC | PRN
Start: 2023-11-18 — End: 2023-11-18
  Administered 2023-11-18: 100 mL via INTRAVENOUS

## 2023-11-18 MED ORDER — ONDANSETRON HCL 4 MG/2ML IJ SOLN
4.0000 mg | Freq: Once | INTRAMUSCULAR | Status: AC
  Administered 2023-11-18: 4 mg via INTRAVENOUS
  Filled 2023-11-18: qty 2

## 2023-11-18 MED ORDER — MORPHINE SULFATE (PF) 4 MG/ML IV SOLN
4.0000 mg | Freq: Once | INTRAVENOUS | Status: AC
  Administered 2023-11-18: 4 mg via INTRAVENOUS
  Filled 2023-11-18: qty 1

## 2023-11-18 MED ORDER — ONDANSETRON 4 MG PO TBDP: 4.0000 mg | ORAL_TABLET | Freq: Three times a day (TID) | ORAL | 0 refills | Status: DC | PRN

## 2023-11-18 MED ORDER — LACTATED RINGERS IV BOLUS
1000.0000 mL | Freq: Once | INTRAVENOUS | Status: AC
  Administered 2023-11-18: 1000 mL via INTRAVENOUS

## 2023-11-18 NOTE — Discharge Instructions (Addendum)
Thank you for coming to [facility] Emergency Department. You were seen for abdominal pain, nausea vomiting diarrhea. We did an exam, labs, and imaging, and these showed likely a viral gastroenteritis, or stomach bug, and dehydration. You were given pain and nausea medicine as well as IV fluids.  We have prescribed Zofran 4 mg under the tongue every 6-8 hours as needed for nausea and vomiting.  You can also take over-the-counter loperamide for diarrhea.  Please do not take this for more than 2 to 3 days.  Please stay well-hydrated at home. Please follow up with your primary care provider within 1 week.   Please follow-up with [facility] as previously scheduled about your radiofrequency ablation recovery.  Do not hesitate to return to the ED or call 911 if you experience: -Worsening symptoms -Nausea/vomiting so severe you cannot eat/drink anything -Blood in your vomit or stool -Lightheadedness, passing out -Fevers/chills -Anything else that concerns you

## 2023-11-18 NOTE — ED Triage Notes (Signed)
Pt presents via POV c/o N/V/D, abd pain, and light headedness starting today. Reports had lower back ablation to L4/L5 yesterday. Ambulatory to triage. A&O x4.

## 2023-11-18 NOTE — ED Provider Notes (Signed)
[place] EMERGENCY DEPARTMENT AT [facility] Provider Note   CSN: [id number] Arrival date & time: 11/18/23  1956     History {Add pertinent medical, surgical, social history, OB history to HPI:1} Chief Complaint  Patient presents with   Emesis   Abdominal Pain    Lori Mann is a 32 y.o. female with PMH as listed below who presents with abdominal pain, nausea/vomiting.   Pt presents via POV c/o N/V/D, periumbilical abd pain, and light headedness starting today. Has had severe abdominal pain, nausea/vomiting and diarrhea. Gets very lightheaded and then vomits. NBNB emesis, NB diarrhea. No h/o similar. Feels like a fever is coming on, but has had cold chills today. Reports had in clinic radiofrequency ablation to L4/L5 yesterday. Took a valium beforehand but did not receive anesthesia for procedure. Follows w/ [facility]. Hasn't taken anything for pain so far today. No urinary or vaginal sxs. Is passing gas. No h/o abdominal surgery. Presents tachycardic into 120s.    Past Medical History:  Diagnosis Date   Allergic rhinitis    Allergy    Asthma    H/O infectious mononucleosis    IBS (irritable bowel syndrome)    Kidney stones    Lumbar herniated disc    Narcolepsy without cataplexy(347.00)        Home Medications Prior to Admission medications   Medication Sig Start Date End Date Taking? Authorizing Provider  albuterol (VENTOLIN HFA) 108 (90 Base) MCG/ACT inhaler 2 PUFFS INHALATION EVERY 4-6 HRS AS NEEDED COUGH/WHEEZE/1/2 HOUR PRIOR TO EXERCISE 90 DAYS    [provider]  azithromycin (ZITHROMAX Z-PAK) 250 MG tablet As directed 08/07/23   Nelwyn Salisbury, MD  drospirenone-ethinyl estradiol Pierre Bali) 3-0.02 MG tablet Take 1 tablet by mouth daily.    [provider]  EPINEPHrine 0.3 mg/0.3 mL IJ SOAJ injection Inject 0.3 mg into the muscle once as needed (allergic reaction).     [provider]   fluticasone furoate-vilanterol (BREO ELLIPTA) 200-25 MCG/ACT AEPB Inhale 1 puff into the lungs daily. 01/23/23   Olalere, Minna Antis, MD  levocetirizine (XYZAL) 5 MG tablet Take 5 mg by mouth daily.    [provider]  methocarbamol (ROBAXIN) 500 MG tablet Take 500 mg by mouth. 07/23/23   [provider]  methylphenidate (RITALIN) 10 MG tablet Take 1 tablet (10 mg total) by mouth 2 (two) times daily. 01/23/23   Tomma Lightning, MD  methylPREDNISolone (MEDROL DOSEPAK) 4 MG TBPK tablet As directed 08/07/23   Nelwyn Salisbury, MD  Olopatadine HCl 0.2 % SOLN olopatadine 0.2 % eye drops  INSTILL ONE DROP INTO BOTH EYES EVERY DAY    [provider]  Semaglutide-Weight Management (WEGOVY) 0.5 MG/0.5ML SOAJ Inject 0.5 mg into the skin once a week. 06/24/23   Deeann Saint, MD  Semaglutide-Weight Management 0.25 MG/0.5ML SOAJ Inject 0.25 mg into the skin once a week. 05/25/23   Deeann Saint, MD  sertraline (ZOLOFT) 50 MG tablet TAKE 1 TABLET BY MOUTH EVERY DAY 08/04/23   Deeann Saint, MD  Spacer/Aero-Holding Chambers (E-Z SPACER) inhaler Use as instructed 02/09/19   Wynn Banker, MD      Allergies    Nickel and Benzoyl peroxide    Review of Systems   Review of Systems A 10 point review of systems was performed and is negative unless otherwise reported in HPI.  Physical Exam Updated Vital Signs BP 104/69 (BP Location: Right Arm)   Pulse Marland Kitchen)  123   Temp 98.7 F (37.1 C) (Oral)   Resp 18   Ht 5\' 6"  (1.676 m)   Wt 92.1 kg   LMP 11/04/2023 (Approximate)   SpO2 97%   BMI 32.77 kg/m  Physical Exam General: Normal appearing female, lying in bed.  HEENT: PERRLA, Sclera anicteric, MMM, trachea midline.  Cardiology: RRR, no murmurs/rubs/gallops. BL radial and DP pulses equal bilaterally.  Resp: Normal respiratory rate and effort. CTAB, no wheezes, rhonchi, crackles.  Abd: +mild distension, periumbilical TTP. Soft. No rebound tenderness or guarding.  GU:  Deferred. MSK: No peripheral edema or signs of trauma.  Back: *** Skin: warm, dry.  Neuro: A&Ox4, CNs II-XII grossly intact. MAEs. Sensation grossly intact.  Psych: Normal mood and affect.   ED Results / Procedures / Treatments   Labs (all labs ordered are listed, but only abnormal results are displayed) Labs Reviewed  COMPREHENSIVE METABOLIC PANEL - Abnormal; Notable for the following components:      Result Value   Glucose, Bld 143 (*)    ALT 45 (*)    All other components within normal limits  CBC - Abnormal; Notable for the following components:   WBC 12.1 (*)    All other components within normal limits  LIPASE, BLOOD  HCG, QUANTITATIVE, PREGNANCY  URINALYSIS, ROUTINE W REFLEX MICROSCOPIC    EKG None  Radiology No results found.  Procedures Procedures  {Document cardiac monitor, telemetry assessment procedure when appropriate:1}  Medications Ordered in ED Medications  lactated ringers bolus 1,000 mL (has no administration in time range)  ondansetron (ZOFRAN) injection 4 mg (has no administration in time range)  morphine (PF) 4 MG/ML injection 4 mg (has no administration in time range)    ED Course/ Medical Decision Making/ A&P                          Medical Decision Making Amount and/or Complexity of Data Reviewed Labs: ordered. Radiology: ordered.  Risk Prescription drug management.    This patient presents to the ED for concern of abdominal pain, nausea/vomiting, this involves an extensive number of treatment options, and is a complaint that carries with it a high risk of complications and morbidity.  I considered the following differential and admission for this acute, potentially life threatening condition.   MDM:    For DDX for abdominal pain includes but is not limited to:  Abdominal exam without peritoneal signs. No evidence of acute abdomen at this time. ***Low suspicion for acute hepatobiliary disease (including acute cholecystitis or  cholangitis), acute pancreatitis (neg lipase), PUD (including gastric perforation), acute appendicitis, bowel obstruction, viscus perforation, or diverticulitis.       Labs: I Ordered, and personally interpreted labs.  The pertinent results include:  those listed above  Imaging Studies ordered: I ordered imaging studies including CT abdomen pelvis I independently visualized and interpreted imaging. I agree with the radiologist interpretation  Additional history obtained from chart review, husband at bedside.  External records from outside source obtained and reviewed including ***  Cardiac Monitoring: The patient was maintained on a cardiac monitor.  I personally viewed and interpreted the cardiac monitored which showed an underlying rhythm of: sinus tachycardia  Reevaluation: After the interventions noted above, I reevaluated the patient and found that they have :{resolved/improved/worsened:23923::"improved"}  Social Determinants of Health: Lives independently  Disposition:  ***  Co morbidities that complicate the patient evaluation  Past Medical History:  Diagnosis Date   Allergic rhinitis  Allergy    Asthma    H/O infectious mononucleosis    IBS (irritable bowel syndrome)    Kidney stones    Lumbar herniated disc    Narcolepsy without cataplexy(347.00)      Medicines Meds ordered this encounter  Medications   lactated ringers bolus 1,000 mL   ondansetron (ZOFRAN) injection 4 mg   morphine (PF) 4 MG/ML injection 4 mg    I have reviewed the patients home medicines and have made adjustments as needed  Problem List / ED Course: Problem List Items Addressed This Visit   None        {Document critical care time when appropriate:1} {Document review of labs and clinical decision tools ie heart score, Chads2Vasc2 etc:1}  {Document your independent review of radiology images, and any outside records:1} {Document your discussion with family members, caretakers,  and with consultants:1} {Document social determinants of health affecting pt's care:1} {Document your decision making why or why not admission, treatments were needed:1}  This note was created using dictation software, which may contain spelling or grammatical errors.

## 2023-11-19 LAB — URINALYSIS, ROUTINE W REFLEX MICROSCOPIC
Bilirubin Urine: NEGATIVE
Glucose, UA: NEGATIVE mg/dL
Hgb urine dipstick: NEGATIVE
Ketones, ur: NEGATIVE mg/dL
Leukocytes,Ua: NEGATIVE
Nitrite: NEGATIVE
Protein, ur: NEGATIVE mg/dL
pH: 5 (ref 5.0–8.0)

## 2023-11-19 MED ORDER — LACTATED RINGERS IV BOLUS
1000.0000 mL | Freq: Once | INTRAVENOUS | Status: AC
  Administered 2023-11-19: 1000 mL via INTRAVENOUS

## 2023-11-19 MED ORDER — METOCLOPRAMIDE HCL 5 MG/ML IJ SOLN
10.0000 mg | Freq: Once | INTRAMUSCULAR | Status: AC
Start: 2023-11-19 — End: 2023-11-19
  Administered 2023-11-19: 10 mg via INTRAVENOUS
  Filled 2023-11-19: qty 2

## 2023-11-19 MED ORDER — METOCLOPRAMIDE HCL 10 MG PO TABS: 10.0000 mg | ORAL_TABLET | Freq: Four times a day (QID) | ORAL | 0 refills | Status: DC

## 2023-11-19 MED ORDER — ONDANSETRON 4 MG PO TBDP: 4.0000 mg | ORAL_TABLET | Freq: Three times a day (TID) | ORAL | 0 refills | Status: DC | PRN

## 2023-11-19 NOTE — ED Notes (Signed)
Pt ambulated to bathroom 

## 2023-11-19 NOTE — ED Provider Notes (Signed)
Care assumed from Dr. Jearld Fenton, patient with vomiting, abdominal pain, had negative CT scan. She is receiving IV fluids, UA is pending.  I have reviewed her urinalysis, and my interpretation is normal urinalysis.  Patient is tolerating oral fluids and crackers.  She continued to have mild tachycardia and I have given her additional IV fluids.  Heart rate continues to be 100, but she is eating well without any further nausea and is nontoxic in appearance.  I feel she is safe for discharge in spite of ongoing mild tachycardia.  I am discharging her with a prescription for metoclopramide.   Dione Booze, MD 11/19/23 [phone]

## 2023-11-26 ENCOUNTER — Ambulatory Visit: Payer: Managed Care, Other (non HMO) | Admitting: Internal Medicine

## 2024-01-22 LAB — LAB REPORT - SCANNED
A1c: 5.3
EGFR: 80

## 2024-06-09 ENCOUNTER — Ambulatory Visit: Admitting: Family Medicine

## 2024-06-09 ENCOUNTER — Encounter: Payer: Self-pay | Admitting: Family Medicine

## 2024-06-09 VITALS — BP 126/76 | HR 110 | Temp 98.8°F | Ht 66.0 in | Wt 213.8 lb

## 2024-06-09 DIAGNOSIS — H01004 Unspecified blepharitis left upper eyelid: Secondary | ICD-10-CM

## 2024-06-09 DIAGNOSIS — H00014 Hordeolum externum left upper eyelid: Secondary | ICD-10-CM

## 2024-06-09 MED ORDER — POLYMYXIN B-TRIMETHOPRIM 10000-0.1 UNIT/ML-% OP SOLN: 1.0000 [drp] | OPHTHALMIC | 0 refills | Status: DC

## 2024-06-09 NOTE — Progress Notes (Signed)
 Established Patient Office Visit   Subjective  Patient ID: Lori Mann, female    DOB: [birth year]  Age: 33 y.o. MRN: [id number]  Chief Complaint  Patient presents with   Medical Management of Chronic Issues    Stye, using warm compress     Patient is a 33 year old female seen for acute concern.  Patient endorses stye on left upper eyelid starting 5 days ago.  Patient applied warm compresses stye and noticed resolution of pustule but upper eyelid edema remained.  Patient endorses watery eyes with clear drainage, pain and continued edema of eyelid.  No changes in vision.    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   Hypersomnia 05/27/2016   Morbid obesity (HCC) 01/17/2016   Past Medical History:  Diagnosis Date   Allergic rhinitis    Allergy 2000   Anxiety 2020   Asthma 2016   H/O infectious mononucleosis    IBS (irritable bowel syndrome)    Kidney stones    Lumbar herniated disc    Narcolepsy without cataplexy(347.00)    Past Surgical History:  Procedure Laterality Date   CYSTOSCOPY WITH RETROGRADE PYELOGRAM, URETEROSCOPY AND STENT PLACEMENT Right 10/11/2015   Procedure: CYSTOSCOPY WITH RETROGRADE PYELOGRAM, URETEROSCOPY,STONE EXTRACTIONAND STENT PLACEMENT;  Surgeon: Trent Frizzle, MD;  Location: [facility];  Service: Urology;  Laterality: Right;   EYE SURGERY  2021   HOLMIUM LASER APPLICATION Right 10/11/2015   Procedure: HOLMIUM LASER APPLICATION;  Surgeon: Trent Frizzle, MD;  Location: [facility];  Service: Urology;  Laterality: Right;   NASAL SEPTUM SURGERY     TONSILLECTOMY     Social History   Tobacco Use   Smoking status: Never   Smokeless tobacco: Never  Vaping Use   Vaping status: Never Used  Substance Use Topics   Alcohol use: Yes     Alcohol/week: 0.0 standard drinks of alcohol   Drug use: No   Family History  Problem Relation Age of Onset   Cancer Mother        breast ca   Arthritis Father    Hyperlipidemia Father    Alcohol abuse Father    Irritable bowel syndrome Sister    Allergies  Allergen Reactions   Nickel Rash    Burning rash  Burning rash, Burning rash   Benzoyl Peroxide Rash    ROS Negative unless stated above    Objective:     BP 126/76 (BP Location: Right Arm, Patient Position: Sitting, Cuff Size: Normal)   Pulse (!) 110   Temp 98.8 F (37.1 C) (Oral)   Ht 5' 6 (1.676 m)   Wt 213 lb 12.8 oz (97 kg)   SpO2 96%   BMI 34.51 kg/m  BP Readings from Last 3 Encounters:  06/09/24 126/76  11/19/23 107/70  08/07/23 104/78   Wt Readings from Last 3 Encounters:  06/09/24 213 lb 12.8 oz (97 kg)  11/18/23 203 lb (92.1 kg)  08/07/23 214 lb (97.1 kg)      Physical Exam Constitutional:      General: She is not in acute distress.    Appearance: Normal appearance.  HENT:     Head: Normocephalic and atraumatic.     Nose: Nose normal.     Mouth/Throat:     Mouth: Mucous membranes are moist.  Eyes:     General:        Left eye: Discharge present.    Comments: L upper eyelid edema and erythema. Clear drainage.  Erythematous papule on lateral edge.   Cardiovascular:     Rate and Rhythm: Normal rate and regular rhythm.     Heart sounds: Normal heart sounds. No murmur heard.    No gallop.  Pulmonary:     Effort: Pulmonary effort is normal. No respiratory distress.     Breath sounds: Normal breath sounds. No wheezing, rhonchi or rales.   Skin:    General: Skin is warm and dry.   Neurological:     Mental Status: She is alert and oriented to person, place, and time.     No results found for any visits on 06/09/24.    Assessment & Plan:   Blepharitis of left upper eyelid, unspecified type -     Polymyxin B-Trimethoprim ; Place 1 drop into the left eye every 4 (four) hours.   Dispense: 10 mL; Refill: 0  Hordeolum externum of left upper eyelid -     Polymyxin B-Trimethoprim ; Place 1 drop into the left eye every 4 (four) hours.  Dispense: 10 mL; Refill: 0  L upper eye lid edema.  Hordeolum resolving.  Continue compresses QID. Start polytrim eye gtt.   For continued or worsened symptoms referral to Ophthalmology.  Return if symptoms worsen or fail to improve.   Viola Greulich, MD

## 2024-06-15 ENCOUNTER — Encounter (HOSPITAL_BASED_OUTPATIENT_CLINIC_OR_DEPARTMENT_OTHER): Payer: Self-pay | Admitting: Internal Medicine

## 2024-06-15 ENCOUNTER — Ambulatory Visit (INDEPENDENT_AMBULATORY_CARE_PROVIDER_SITE_OTHER): Payer: Managed Care, Other (non HMO) | Admitting: Internal Medicine

## 2024-06-15 VITALS — BP 122/82 | HR 92 | Ht 66.0 in | Wt 212.0 lb

## 2024-06-15 DIAGNOSIS — E782 Mixed hyperlipidemia: Secondary | ICD-10-CM

## 2024-06-15 DIAGNOSIS — I4711 Inappropriate sinus tachycardia, so stated: Secondary | ICD-10-CM | POA: Diagnosis not present

## 2024-06-15 DIAGNOSIS — Z136 Encounter for screening for cardiovascular disorders: Secondary | ICD-10-CM

## 2024-06-15 DIAGNOSIS — R0602 Shortness of breath: Secondary | ICD-10-CM | POA: Diagnosis not present

## 2024-06-15 NOTE — Progress Notes (Signed)
 LIPID CLINIC CONSULT NOTE  Chief Complaint:  High triglycerides  Primary Care Physician: Mercer Clotilda SAUNDERS, MD  Primary Cardiologist:  None  HPI:  Lori Mann is a 33 y.o. female who is being seen today for the evaluation of high triglycerides at the request of Mercer Clotilda SAUNDERS, MD. this a pleasant 33 year old female kindly referred for evaluation management of high triglycerides.  She has a history of high triglycerides mostly in about the 250 range I was referred by her PCP after several abnormal labs.  More recently however she has been on GLP-1 therapy with semaglutide  and Wegovy  as well as compounded medications and had lost weight down to about 185 pounds.  She had blood work in January after this and it was noted that her triglycerides had normalized with total cholesterol 220, triglycerides 148, HDL 50 and LDL 140.  She also was regularly eating [employer] fried chicken nuggets on a daily basis and noted that she was unable to eat that while she was taking Wegovy .  Unfortunately, insurance did not cover her medications and now that compounded GLP-1's are not available she has started to gain back some weight.  She has not had her lipids reassessed since January.  Additionally, she had a number of other concerns today including the fact that she has a baseline tachycardia.  She notes her heart rate runs around 100-105 at baseline.  She notes with minimal activities such as walking upstairs that her heart rate will shoot up.  Of note she does have bad asthma and is on both a Breo inhaler as well as albuterol  which she uses several times a day.  She also has as needed Ritalin  which could increase her heart rate although does not take it very regularly.  She does note some shortness of breath as well with again going up stairs.  She also has hard heartbeats when lying in bed at night.  She does have an Apple Watch and an Aura ring, both of which have tracked her fast heart rate.  She  tells me that she has passed out before and has felt presyncopal as well.  PMHx:  Past Medical History:  Diagnosis Date   Allergic rhinitis    Allergy 2000   Anxiety 2020   Asthma 2016   H/O infectious mononucleosis    IBS (irritable bowel syndrome)    Kidney stones    Lumbar herniated disc    Narcolepsy without cataplexy(347.00)     Past Surgical History:  Procedure Laterality Date   CYSTOSCOPY WITH RETROGRADE PYELOGRAM, URETEROSCOPY AND STENT PLACEMENT Right 10/11/2015   Procedure: CYSTOSCOPY WITH RETROGRADE PYELOGRAM, URETEROSCOPY,STONE EXTRACTIONAND STENT PLACEMENT;  Surgeon: Garnette Shack, MD;  Location: [facility];  Service: Urology;  Laterality: Right;   EYE SURGERY  2021   HOLMIUM LASER APPLICATION Right 10/11/2015   Procedure: HOLMIUM LASER APPLICATION;  Surgeon: Garnette Shack, MD;  Location: [facility];  Service: Urology;  Laterality: Right;   NASAL SEPTUM SURGERY     TONSILLECTOMY      FAMHx:  Family History  Problem Relation Age of Onset   Cancer Mother        breast ca   Arthritis Father    Hyperlipidemia Father    Alcohol abuse Father    Irritable bowel syndrome Sister     SOCHx:   reports that she has never smoked. She has never used smokeless tobacco. She reports current alcohol use. She reports that she does not  use drugs.  ALLERGIES:  Allergies  Allergen Reactions   Nickel Rash and Dermatitis    Burning rash  Burning rash, Burning rash   Benzoyl Peroxide Rash and Dermatitis    ROS: Pertinent items noted in HPI and remainder of comprehensive ROS otherwise negative.  HOME MEDS: Current Outpatient Medications on File Prior to Visit  Medication Sig Dispense Refill   albuterol  (VENTOLIN  HFA) 108 (90 Base) MCG/ACT inhaler 2 PUFFS INHALATION EVERY 4-6 HRS AS NEEDED COUGH/WHEEZE/1/2 HOUR PRIOR TO EXERCISE 90 DAYS     azelastine  (OPTIVAR ) 0.05 % ophthalmic solution Place 1 drop into both eyes as needed.      cetirizine (ZYRTEC) 10 MG tablet Take 10 mg by mouth daily.     drospirenone-ethinyl estradiol (YAZ,GIANVI,LORYNA) 3-0.02 MG tablet Take 1 tablet by mouth daily.     fluticasone  furoate-vilanterol (BREO ELLIPTA ) 200-25 MCG/ACT AEPB Inhale 1 puff into the lungs daily. 60 each 5   levocetirizine (XYZAL) 5 MG tablet Take 5 mg by mouth daily.     methocarbamol (ROBAXIN) 500 MG tablet Take 500 mg by mouth.     methylphenidate  (RITALIN ) 10 MG tablet Take 1 tablet (10 mg total) by mouth 2 (two) times daily. (Patient taking differently: Take 10 mg by mouth as needed.) 60 tablet 0   metoCLOPramide  (REGLAN ) 10 MG tablet Take 1 tablet (10 mg total) by mouth every 6 (six) hours. (Patient taking differently: Take 10 mg by mouth as needed.) 30 tablet 0   Olopatadine HCl 0.2 % SOLN olopatadine 0.2 % eye drops  INSTILL ONE DROP INTO BOTH EYES EVERY DAY (Patient taking differently: Place 1 drop into both eyes as needed.)     ondansetron  (ZOFRAN -ODT) 4 MG disintegrating tablet Take 1 tablet (4 mg total) by mouth every 8 (eight) hours as needed. 20 tablet 0   sertraline  (ZOLOFT ) 50 MG tablet TAKE 1 TABLET BY MOUTH EVERY DAY 90 tablet 3   Spacer/Aero-Holding Chambers (E-Z SPACER) inhaler Use as instructed 1 each 2   EPINEPHrine 0.3 mg/0.3 mL IJ SOAJ injection Inject 0.3 mg into the muscle once as needed (allergic reaction).  (Patient not taking: Reported on 06/15/2024)     ibuprofen (ADVIL) 200 MG tablet Take 200 mg by mouth as needed. (Patient not taking: Reported on 06/15/2024)     trimethoprim -polymyxin b  (POLYTRIM ) ophthalmic solution Place 1 drop into the left eye every 4 (four) hours. 10 mL 0   No current facility-administered medications on file prior to visit.    LABS/IMAGING: No results found for this or any previous visit (from the past 48 hours). No results found.  LIPID PANEL:    Component Value Date/Time   CHOL 158 05/25/2023 0942   TRIG 275.0 (H) 05/25/2023 0942   HDL 52.70 05/25/2023 0942    CHOLHDL 3 05/25/2023 0942   VLDL 55.0 (H) 05/25/2023 0942   LDLCALC 74 06/26/2015 1122   LDLDIRECT 72.0 05/25/2023 0942    WEIGHTS: Wt Readings from Last 3 Encounters:  06/15/24 212 lb (96.2 kg)  06/09/24 213 lb 12.8 oz (97 kg)  11/18/23 203 lb (92.1 kg)    VITALS: BP 122/82 (BP Location: Left Arm, Patient Position: Sitting, Cuff Size: Large)   Pulse 92   Ht 5' 6 (1.676 m)   Wt 212 lb (96.2 kg)   SpO2 97%   BMI 34.22 kg/m   EXAM: General appearance: alert, no distress, and pale Lungs: clear to auscultation bilaterally Heart: regular rate and rhythm, S1, S2 normal, no murmur, click, rub  or gallop Extremities: extremities normal, atraumatic, no cyanosis or edema Neurologic: Grossly normal  EKG: EKG Interpretation Date/Time:  Wednesday June 15 2024 14:44:27 EDT Ventricular Rate:  92 PR Interval:  148 QRS Duration:  86 QT Interval:  362 QTC Calculation: 447 R Axis:   68  Text Interpretation: Normal sinus rhythm Normal ECG When compared with ECG of 21-Jun-2018 17:13, No significant change was found Confirmed by Mona Kent [phone]) on 06/15/2024 3:18:51 PM    ASSESSMENT: Dyslipidemia with high triglycerides Inappropriate sinus tachycardia Moderate obesity Asthma Dyspnea on exertion  PLAN: 1.   Ms. Gose was referred for high triglycerides however after substantial weight loss and GLP-1's her triglycerides had normalized.  This suggests a high likelihood that diet is a major factor in her triglycerides although she might have a genetic triglyceride disorder.  To that end I would continue to work on weight loss and dietary modifications.  Unfortunately she has not been able to get the medications so this may be a challenge.  Additionally she wanted to discuss tachycardia today.  It sounds like she might have an inappropriate tachycardia and possibly even some dysautonomia such as POTS syndrome although it is difficult to give her that diagnosis.  She is on beta agonist  such as albuterol  which she uses regularly which certainly could increase her heart rate.  She does report shortness of breath when walking upstairs.  Some of this may be due to obesity due to metabolic demand.  I would like to get an echo however just to make sure that there is no cardiac explanation such as cardiomyopathy that would explain why her heart rate is increasing.  I will contact her with the results of that study.  Will also update her lipids to see how she is doing now after she has been off of her weight loss medications.  Thanks as always for the kind referral.  Kent KYM Mona, MD, [facility], FNLA, FACP  [place]  [facility]  Medical Director of the [facility] Diplomate of the American Board of Clinical Lipidology Attending Cardiologist  Direct Dial: [phone]  Fax: [phone]  Website:  [email or web address]  Kent JAYSON Mona 06/15/2024, 3:19 PM

## 2024-06-15 NOTE — Patient Instructions (Signed)
 Medication Instructions:  Your physician recommends that you continue on your current medications as directed. Please refer to the Current Medication list given to you today.  *If you need a refill on your cardiac medications before your next appointment, please call your pharmacy*  Lab Work: FASTING Lipids- NMR and LPa as soon as able If you have labs (blood work) drawn today and your tests are completely normal, you will receive your results only by: MyChart Message (if you have MyChart) OR A paper copy in the mail If you have any lab test that is abnormal or we need to change your treatment, we will call you to review the results.  Testing/Procedures: Your physician has requested that you have an echocardiogram. Echocardiography is a painless test that uses sound waves to create images of your heart. It provides your doctor with information about the size and shape of your heart and how well your heart's chambers and valves are working. This procedure takes approximately one hour. There are no restrictions for this procedure. Please do NOT wear cologne, perfume, aftershave, or lotions (deodorant is allowed). Please arrive 15 minutes prior to your appointment time.   Your next appointment:   TBD with Dr. Mona - pending echo and lab results

## 2024-07-19 LAB — NMR, LIPOPROFILE
Cholesterol, Total: 204 mg/dL — ABNORMAL HIGH (ref 100–199)
HDL Particle Number: 53.7 umol/L (ref >=30.5)
HDL-C: 63 mg/dL (ref >39)
LDL Particle Number: 1861 nmol/L — ABNORMAL HIGH (ref <1000)
LDL Size: 19.8 nm — ABNORMAL LOW (ref >20.5)
LDL-C (NIH Calc): 112 mg/dL — ABNORMAL HIGH (ref 0–99)
LP-IR Score: 82 — ABNORMAL HIGH (ref <=45)
Small LDL Particle Number: 1361 nmol/L — ABNORMAL HIGH (ref <=527)
Triglycerides: 168 mg/dL — ABNORMAL HIGH (ref 0–149)

## 2024-07-19 LAB — LIPOPROTEIN A (LPA): Lipoprotein (a): 69 nmol/L (ref <75.0)

## 2024-07-20 ENCOUNTER — Ambulatory Visit (HOSPITAL_BASED_OUTPATIENT_CLINIC_OR_DEPARTMENT_OTHER)

## 2024-07-20 DIAGNOSIS — R0602 Shortness of breath: Secondary | ICD-10-CM | POA: Diagnosis not present

## 2024-07-20 DIAGNOSIS — I4711 Inappropriate sinus tachycardia, so stated: Secondary | ICD-10-CM | POA: Diagnosis not present

## 2024-07-20 LAB — ECHOCARDIOGRAM COMPLETE
AR max vel: 2.21 cm2
AV Area VTI: 2.15 cm2
AV Area mean vel: 2.16 cm2
AV Mean grad: 6 mmHg
AV Peak grad: 11.7 mmHg
Ao pk vel: 1.71 m/s
Area-P 1/2: 4.77 cm2
S' Lateral: 3.1 cm

## 2024-07-21 ENCOUNTER — Ambulatory Visit (HOSPITAL_BASED_OUTPATIENT_CLINIC_OR_DEPARTMENT_OTHER): Payer: Self-pay | Admitting: Internal Medicine

## 2024-07-21 DIAGNOSIS — Z136 Encounter for screening for cardiovascular disorders: Secondary | ICD-10-CM

## 2024-08-04 ENCOUNTER — Other Ambulatory Visit: Payer: Self-pay | Admitting: Family Medicine

## 2024-08-04 DIAGNOSIS — F411 Generalized anxiety disorder: Secondary | ICD-10-CM

## 2024-08-04 DIAGNOSIS — F320 Major depressive disorder, single episode, mild: Secondary | ICD-10-CM

## 2024-08-09 ENCOUNTER — Other Ambulatory Visit: Payer: Self-pay | Admitting: Family Medicine

## 2024-08-09 DIAGNOSIS — F320 Major depressive disorder, single episode, mild: Secondary | ICD-10-CM

## 2024-08-09 DIAGNOSIS — F411 Generalized anxiety disorder: Secondary | ICD-10-CM

## 2024-08-09 NOTE — Telephone Encounter (Signed)
 Copied from CRM #[id number]. Topic: Clinical - Medication Refill >> Aug 09, 2024  8:15 AM Rosina BIRCH wrote: Medication: sertraline  (ZOLOFT ) 50 MG tablet  Has the patient contacted their pharmacy? No (Agent: If no, request that the patient contact the pharmacy for the refill. If patient does not wish to contact the pharmacy document the reason why and proceed with request.) (Agent: If yes, when and what did the pharmacy advise?)  This is the patient's preferred pharmacy:  [id number] - [place], [place] - [address] [place] [address] Phone: [phone] Fax: [phone]  Is this the correct pharmacy for this prescription? Yes If no, delete pharmacy and type the correct one.   Has the prescription been filled recently? Yes  Is the patient out of the medication? No  Has the patient been seen for an appointment in the last year OR does the patient have an upcoming appointment? Yes  Can we respond through MyChart? Yes  Agent: Please be advised that Rx refills may take up to 3 business days. We ask that you follow-up with your pharmacy.

## 2024-08-09 NOTE — Telephone Encounter (Signed)
 Called and left patient a VM to return call to sch Annual visit for Med refill

## 2024-08-10 ENCOUNTER — Telehealth (HOSPITAL_COMMUNITY): Payer: Self-pay | Admitting: *Deleted

## 2024-08-10 NOTE — Telephone Encounter (Signed)
 Reminder call with instructions given for upcoming ETT on 08/12/24 at 1:45.

## 2024-08-12 ENCOUNTER — Encounter (HOSPITAL_COMMUNITY): Payer: Self-pay

## 2024-08-12 ENCOUNTER — Ambulatory Visit (HOSPITAL_COMMUNITY)
Admission: RE | Admit: 2024-08-12 | Discharge: 2024-08-12 | Disposition: A | Discharge status: Home / Self Care | Source: Ambulatory Visit | Attending: Internal Medicine | Admitting: Internal Medicine

## 2024-08-12 DIAGNOSIS — Z136 Encounter for screening for cardiovascular disorders: Secondary | ICD-10-CM

## 2024-08-12 NOTE — Telephone Encounter (Unsigned)
 Copied from CRM [id number]. Topic: Clinical - Medication Question >> Aug 12, 2024  9:37 AM Harlene ORN wrote: Reason for CRM: patient called requesting 4 days worth of Zoloft  to last her until her upcoming appointment with her PCP for 08/19/2024

## 2024-08-14 MED ORDER — METOPROLOL SUCCINATE ER 25 MG PO TB24: 12.5000 mg | ORAL_TABLET | Freq: Every day | ORAL | 3 refills | Status: DC

## 2024-08-14 NOTE — Addendum Note (Signed)
 Addended by: MONA VINIE BROCKS on: 08/14/2024 03:37 PM   Modules accepted: Orders

## 2024-08-15 ENCOUNTER — Other Ambulatory Visit: Payer: Self-pay | Admitting: Family Medicine

## 2024-08-15 DIAGNOSIS — F411 Generalized anxiety disorder: Secondary | ICD-10-CM

## 2024-08-15 DIAGNOSIS — F320 Major depressive disorder, single episode, mild: Secondary | ICD-10-CM

## 2024-08-15 NOTE — Telephone Encounter (Unsigned)
 Copied from CRM [id number]. Topic: Clinical - Medication Refill >> Aug 15, 2024  8:51 AM Tanazia G wrote: Medication: zoloft  50mg   Has the patient contacted their pharmacy? Yes (Agent: If no, request that the patient contact the pharmacy for the refill. If patient does not wish to contact the pharmacy document the reason why and proceed with request.) (Agent: If yes, when and what did the pharmacy advise?)  This is the patient's preferred pharmacy:  [id number] - [place], [place] - [address] [place] [address] Phone: [phone] Fax: [phone]  Is this the correct pharmacy for this prescription? Yes If no, delete pharmacy and type the correct one.   Has the prescription been filled recently? Yes  Is the patient out of the medication? Yes  Has the patient been seen for an appointment in the last year OR does the patient have an upcoming appointment? Yes  Can we respond through MyChart? Yes  Agent: Please be advised that Rx refills may take up to 3 business days. We ask that you follow-up with your pharmacy.

## 2024-08-16 ENCOUNTER — Encounter (HOSPITAL_BASED_OUTPATIENT_CLINIC_OR_DEPARTMENT_OTHER): Payer: Self-pay | Admitting: Internal Medicine

## 2024-08-16 ENCOUNTER — Encounter (HOSPITAL_COMMUNITY): Payer: Self-pay

## 2024-08-16 ENCOUNTER — Encounter (HOSPITAL_COMMUNITY): Payer: Self-pay | Admitting: Internal Medicine

## 2024-08-16 MED ORDER — SERTRALINE HCL 50 MG PO TABS
50.0000 mg | ORAL_TABLET | Freq: Every day | ORAL | 0 refills | Status: DC
Start: 2024-08-16 — End: 2024-09-07

## 2024-08-16 NOTE — Addendum Note (Signed)
 Addended by: BRIEN SONG A on: 08/16/2024 08:43 AM   Modules accepted: Orders

## 2024-08-16 NOTE — Addendum Note (Signed)
 Addended by: BRIEN SONG A on: 08/16/2024 08:44 AM   Modules accepted: Orders

## 2024-08-19 ENCOUNTER — Ambulatory Visit: Admitting: Family Medicine

## 2024-08-19 ENCOUNTER — Encounter: Payer: Self-pay | Admitting: Family Medicine

## 2024-08-19 VITALS — BP 126/80 | HR 94 | Temp 98.1°F | Ht 66.0 in | Wt 210.5 lb

## 2024-08-19 DIAGNOSIS — F320 Major depressive disorder, single episode, mild: Secondary | ICD-10-CM

## 2024-08-19 DIAGNOSIS — R41840 Attention and concentration deficit: Secondary | ICD-10-CM

## 2024-08-19 DIAGNOSIS — F411 Generalized anxiety disorder: Secondary | ICD-10-CM | POA: Diagnosis not present

## 2024-08-19 NOTE — Progress Notes (Signed)
 Established Patient Office Visit   Subjective  Patient ID: Lori Mann, female    DOB: 07-Mar-1991  Age: 33 y.o. MRN: [id number]  Chief Complaint  Patient presents with   Medical Management of Chronic Issues    Pt is a 33 yo female seen for ongoing concerns and f/u.  Pt states this yr has been difficult.  Pt had back surgery in March, but still having issues with her back.  Pt's father who had Alzheimer's died yesterday.  Pt feels like she has been grieving his loss for yrs as he was unable to recall her name.  Pt taking Zoloft  50 mg daily.  Had an appt with [place], a counselor at [facility] [address] Today but had to cancell it.  New appt set for 9/23.  Pt concerned she may have ADHD and can get the testing with that provider.  In grade school and college pt got As.  Currently does ok at work but has various reminders to keep her on track.  Pt feels scattered at home.  Notes dropping things, hyperfocus, depression, anxiety, self doubt, difficulty sticking to a schedule, inconsistent, does not like wooden spoons or debris on her feet so needs to have socks on, rehearses conversations in her head.  Pt's sister is on Vyvanse.    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   Hypersomnia 05/27/2016   Morbid obesity (HCC) 01/17/2016   Past Medical History:  Diagnosis Date   Allergic rhinitis    Allergy 2000   Anxiety 2020   Asthma 2016   H/O infectious mononucleosis    IBS (irritable bowel syndrome)    Kidney stones    Lumbar herniated disc    Narcolepsy without cataplexy(347.00)    Past Surgical History:  Procedure Laterality Date   CYSTOSCOPY WITH RETROGRADE PYELOGRAM, URETEROSCOPY AND STENT PLACEMENT Right 10/11/2015   Procedure: CYSTOSCOPY WITH RETROGRADE PYELOGRAM,  URETEROSCOPY,STONE EXTRACTIONAND STENT PLACEMENT;  Surgeon: Garnette Shack, MD;  Location: [facility];  Service: Urology;  Laterality: Right;   EYE SURGERY  2021   HOLMIUM LASER APPLICATION Right 10/11/2015   Procedure: HOLMIUM LASER APPLICATION;  Surgeon: Garnette Shack, MD;  Location: [facility];  Service: Urology;  Laterality: Right;   NASAL SEPTUM SURGERY     TONSILLECTOMY     Social History   Tobacco Use   Smoking status: Never   Smokeless tobacco: Never  Vaping Use   Vaping status: Never Used  Substance Use Topics   Alcohol use: Yes    Alcohol/week: 0.0 standard drinks of alcohol   Drug use: No   Family History  Problem Relation Age of Onset   Cancer Mother        breast ca   Arthritis Father    Hyperlipidemia Father    Alcohol abuse Father    Irritable bowel syndrome Sister    Allergies  Allergen Reactions   Nickel Rash and Dermatitis    Burning rash  Burning rash, Burning rash   Benzoyl Peroxide Rash and Dermatitis    ROS Negative unless stated above    Objective:     BP 126/80   Pulse 94   Temp 98.1 F (36.7 C) (Temporal)   Ht 5' 6 (1.676 m)   Wt 210 lb 8 oz (  95.5 kg)   SpO2 96%   BMI 33.98 kg/m  BP Readings from Last 3 Encounters:  08/19/24 126/80  06/15/24 122/82  06/09/24 126/76   Wt Readings from Last 3 Encounters:  08/19/24 210 lb 8 oz (95.5 kg)  06/15/24 212 lb (96.2 kg)  06/09/24 213 lb 12.8 oz (97 kg)      Physical Exam Constitutional:      General: She is not in acute distress.    Appearance: Normal appearance.     Comments: Tearful.  HENT:     Head: Normocephalic and atraumatic.     Nose: Nose normal.     Mouth/Throat:     Mouth: Mucous membranes are moist.  Cardiovascular:     Rate and Rhythm: Normal rate and regular rhythm.     Heart sounds: Normal heart sounds. No murmur heard.    No gallop.  Pulmonary:     Effort: Pulmonary effort is normal. No respiratory distress.      Breath sounds: Normal breath sounds. No wheezing, rhonchi or rales.  Skin:    General: Skin is warm and dry.  Neurological:     Mental Status: She is alert and oriented to person, place, and time.        08/19/2024    4:19 PM 06/15/2024    3:21 PM 05/25/2023    9:15 AM  Depression screen PHQ 2/9  Decreased Interest 2 -- 1  Down, Depressed, Hopeless 2 1 1   PHQ - 2 Score 4 1 2   Altered sleeping 2  1  Tired, decreased energy 3  2  Change in appetite 2  1  Feeling bad or failure about yourself  2  1  Trouble concentrating 2  1  Moving slowly or fidgety/restless 1  0  Suicidal thoughts 0  0  PHQ-9 Score 16  8  Difficult doing work/chores Very difficult  Somewhat difficult      08/19/2024    4:19 PM 02/11/2023   10:33 AM 02/02/2023    3:11 PM 08/08/2022   10:07 AM  GAD 7 : Generalized Anxiety Score  Nervous, Anxious, on Edge 1 3 2 3   Control/stop worrying 2 3 1 3   Worry too much - different things 2 3 2 3   Trouble relaxing 2 2 0 2  Restless 2 1 0 1  Easily annoyed or irritable 2 2 2 2   Afraid - awful might happen 2 1 0 1  Total GAD 7 Score 13 15 7 15   Anxiety Difficulty Somewhat difficult Somewhat difficult Somewhat difficult Somewhat difficult     No results found for any visits on 08/19/24.    Assessment & Plan:   Poor concentration  GAD (generalized anxiety disorder)  Depression, major, single episode, mild (HCC)   Acute anxiety/depression sx due to adjustment d/o on chronic sx.  PHQ 9 socre 15 and GAD 7 score 13 this visit. Continue Zoloft  50 mg daily.  Discussed starting a prn med if needed in the next few wks.  Encouraged to keep upcoming appt for counseling and ADHD testing.  Will re-evaluate meds once testing results available.  Given precautions.  Return in about 4 weeks (around 09/16/2024).sooner if needed.   Clotilda JONELLE Single, MD

## 2024-08-23 ENCOUNTER — Encounter (HOSPITAL_BASED_OUTPATIENT_CLINIC_OR_DEPARTMENT_OTHER): Payer: Self-pay

## 2024-08-23 ENCOUNTER — Encounter (HOSPITAL_BASED_OUTPATIENT_CLINIC_OR_DEPARTMENT_OTHER): Payer: Self-pay | Admitting: Internal Medicine

## 2024-09-07 ENCOUNTER — Other Ambulatory Visit: Payer: Self-pay | Admitting: Family Medicine

## 2024-09-07 DIAGNOSIS — F411 Generalized anxiety disorder: Secondary | ICD-10-CM

## 2024-09-07 DIAGNOSIS — F320 Major depressive disorder, single episode, mild: Secondary | ICD-10-CM

## 2024-09-15 ENCOUNTER — Other Ambulatory Visit (HOSPITAL_COMMUNITY): Payer: Self-pay

## 2024-10-07 ENCOUNTER — Encounter: Admitting: Family Medicine

## 2024-10-13 ENCOUNTER — Emergency Department (HOSPITAL_COMMUNITY)
Admission: EM | Admit: 2024-10-13 | Discharge: 2024-10-13 | Disposition: A | Discharge status: Home / Self Care | Attending: Emergency Medicine | Admitting: Emergency Medicine

## 2024-10-13 ENCOUNTER — Other Ambulatory Visit: Payer: Self-pay

## 2024-10-13 ENCOUNTER — Encounter (HOSPITAL_COMMUNITY): Payer: Self-pay

## 2024-10-13 ENCOUNTER — Emergency Department (HOSPITAL_COMMUNITY)

## 2024-10-13 DIAGNOSIS — R7989 Other specified abnormal findings of blood chemistry: Secondary | ICD-10-CM | POA: Insufficient documentation

## 2024-10-13 DIAGNOSIS — D72829 Elevated white blood cell count, unspecified: Secondary | ICD-10-CM | POA: Diagnosis not present

## 2024-10-13 DIAGNOSIS — R10A2 Flank pain, left side: Secondary | ICD-10-CM | POA: Diagnosis present

## 2024-10-13 DIAGNOSIS — J45909 Unspecified asthma, uncomplicated: Secondary | ICD-10-CM | POA: Insufficient documentation

## 2024-10-13 DIAGNOSIS — Z79899 Other long term (current) drug therapy: Secondary | ICD-10-CM | POA: Diagnosis not present

## 2024-10-13 DIAGNOSIS — N201 Calculus of ureter: Secondary | ICD-10-CM

## 2024-10-13 DIAGNOSIS — N132 Hydronephrosis with renal and ureteral calculous obstruction: Secondary | ICD-10-CM | POA: Insufficient documentation

## 2024-10-13 DIAGNOSIS — N390 Urinary tract infection, site not specified: Secondary | ICD-10-CM | POA: Insufficient documentation

## 2024-10-13 LAB — URINALYSIS, ROUTINE W REFLEX MICROSCOPIC
Bilirubin Urine: NEGATIVE
Glucose, UA: NEGATIVE mg/dL
Hgb urine dipstick: NEGATIVE
Ketones, ur: NEGATIVE mg/dL
Nitrite: NEGATIVE
Protein, ur: 30 mg/dL — AB
Specific Gravity, Urine: 1.033 — ABNORMAL HIGH (ref 1.005–1.030)
WBC, UA: >50 WBC/hpf (ref 0–5)
pH: 5 (ref 5.0–8.0)

## 2024-10-13 LAB — COMPREHENSIVE METABOLIC PANEL WITH GFR
ALT: 20 U/L (ref 0–44)
AST: 21 U/L (ref 15–41)
Albumin: 4.2 g/dL (ref 3.5–5.0)
Alkaline Phosphatase: 46 U/L (ref 38–126)
Anion gap: 13 (ref 5–15)
BUN: 15 mg/dL (ref 6–20)
CO2: 23 mmol/L (ref 22–32)
Calcium: 9.7 mg/dL (ref 8.9–10.3)
Chloride: 100 mmol/L (ref 98–111)
Creatinine, Ser: 1.06 mg/dL — ABNORMAL HIGH (ref 0.44–1.00)
GFR, Estimated: >60 mL/min (ref >60)
Glucose, Bld: 121 mg/dL — ABNORMAL HIGH (ref 70–99)
Potassium: 4.2 mmol/L (ref 3.5–5.1)
Sodium: 136 mmol/L (ref 135–145)
Total Bilirubin: 0.3 mg/dL (ref 0.0–1.2)
Total Protein: 7.9 g/dL (ref 6.5–8.1)

## 2024-10-13 LAB — CBC WITH DIFFERENTIAL/PLATELET
Abs Immature Granulocytes: 0.06 K/uL (ref 0.00–0.07)
Basophils Absolute: 0 K/uL (ref 0.0–0.1)
Basophils Relative: 0 %
Eosinophils Absolute: 0.1 K/uL (ref 0.0–0.5)
Eosinophils Relative: 1 %
HCT: 41.6 % (ref 36.0–46.0)
Hemoglobin: 14 g/dL (ref 12.0–15.0)
Immature Granulocytes: 1 %
Lymphocytes Relative: 19 %
Lymphs Abs: 2.5 K/uL (ref 0.7–4.0)
MCH: 30.2 pg (ref 26.0–34.0)
MCHC: 33.7 g/dL (ref 30.0–36.0)
MCV: 89.8 fL (ref 80.0–100.0)
Monocytes Absolute: 0.6 K/uL (ref 0.1–1.0)
Monocytes Relative: 5 %
Neutro Abs: 9.5 K/uL — ABNORMAL HIGH (ref 1.7–7.7)
Neutrophils Relative %: 74 %
Platelets: 405 K/uL — ABNORMAL HIGH (ref 150–400)
RBC: 4.63 MIL/uL (ref 3.87–5.11)
RDW: 12.2 % (ref 11.5–15.5)
WBC: 12.7 K/uL — ABNORMAL HIGH (ref 4.0–10.5)
nRBC: 0 % (ref 0.0–0.2)

## 2024-10-13 LAB — BASIC METABOLIC PANEL WITH GFR
Anion gap: 12 (ref 5–15)
BUN: 13 mg/dL (ref 6–20)
CO2: 21 mmol/L — ABNORMAL LOW (ref 22–32)
Calcium: 8.8 mg/dL — ABNORMAL LOW (ref 8.9–10.3)
Chloride: 106 mmol/L (ref 98–111)
Creatinine, Ser: 1.04 mg/dL — ABNORMAL HIGH (ref 0.44–1.00)
GFR, Estimated: >60 mL/min (ref >60)
Glucose, Bld: 97 mg/dL (ref 70–99)
Potassium: 4.3 mmol/L (ref 3.5–5.1)
Sodium: 140 mmol/L (ref 135–145)

## 2024-10-13 LAB — PREGNANCY, URINE: Preg Test, Ur: NEGATIVE

## 2024-10-13 LAB — I-STAT CG4 LACTIC ACID, ED
Lactic Acid, Venous: 2.2 mmol/L (ref 0.5–1.9)
Lactic Acid, Venous: 1.7 mmol/L (ref 0.5–1.9)

## 2024-10-13 MED ORDER — SODIUM CHLORIDE 0.9 % IV BOLUS
1000.0000 mL | Freq: Once | INTRAVENOUS | Status: AC
  Administered 2024-10-13: 1000 mL via INTRAVENOUS

## 2024-10-13 MED ORDER — TAMSULOSIN HCL 0.4 MG PO CAPS: 0.4000 mg | ORAL_CAPSULE | Freq: Every day | ORAL | 0 refills | Status: AC

## 2024-10-13 MED ORDER — MORPHINE SULFATE (PF) 4 MG/ML IV SOLN
4.0000 mg | Freq: Once | INTRAVENOUS | Status: AC
  Administered 2024-10-13: 4 mg via INTRAVENOUS
  Filled 2024-10-13: qty 1

## 2024-10-13 MED ORDER — SODIUM CHLORIDE 0.9 % IV SOLN
1.0000 g | Freq: Once | INTRAVENOUS | Status: AC
  Administered 2024-10-13: 1 g via INTRAVENOUS
  Filled 2024-10-13: qty 10

## 2024-10-13 MED ORDER — OXYCODONE HCL 5 MG PO TABS: 5.0000 mg | ORAL_TABLET | Freq: Four times a day (QID) | ORAL | 0 refills | Status: DC | PRN

## 2024-10-13 MED ORDER — OXYCODONE-ACETAMINOPHEN 5-325 MG PO TABS
1.0000 | ORAL_TABLET | ORAL | Status: AC | PRN
  Administered 2024-10-13 (×2): 1 via ORAL
  Filled 2024-10-13 (×2): qty 1

## 2024-10-13 MED ORDER — ONDANSETRON HCL 4 MG PO TABS: 4.0000 mg | ORAL_TABLET | Freq: Four times a day (QID) | ORAL | 0 refills | Status: AC

## 2024-10-13 MED ORDER — ONDANSETRON 4 MG PO TBDP
4.0000 mg | ORAL_TABLET | Freq: Once | ORAL | Status: AC
  Administered 2024-10-13: 4 mg via ORAL
  Filled 2024-10-13: qty 1

## 2024-10-13 MED ORDER — CEPHALEXIN 500 MG PO CAPS: 500.0000 mg | ORAL_CAPSULE | Freq: Four times a day (QID) | ORAL | 0 refills | Status: DC

## 2024-10-13 MED ORDER — HYOSCYAMINE SULFATE SL 0.125 MG SL SUBL: 0.1250 mg | SUBLINGUAL_TABLET | SUBLINGUAL | 0 refills | Status: DC | PRN

## 2024-10-13 NOTE — ED Provider Notes (Signed)
 [place] EMERGENCY DEPARTMENT AT [facility] Provider Note   CSN: [id number] Arrival date & time: 10/13/24  0630     History  Chief Complaint  Patient presents with   Flank Pain    RHILYN Mann is a 33 y.o. female with PMH as listed below who presents with left sided flank pain. Reports hx of kidney stones requiring surgery and this feels similar.  Pain has been ongoing since the last couple of days and intermittent associated with nausea but no vomiting.  No fevers chills or urinary symptoms.  Pain is located only in the left flank and no other locations.  Otherwise had been in her normal state of health.   Past Medical History:  Diagnosis Date   Allergic rhinitis    Allergy 2000   Anxiety 2020   Asthma 2016   H/O infectious mononucleosis    IBS (irritable bowel syndrome)    Kidney stones    Lumbar herniated disc    Narcolepsy without cataplexy(347.00)        Home Medications Prior to Admission medications   Medication Sig Start Date End Date Taking? Authorizing Provider  cephALEXin (KEFLEX) 500 MG capsule Take 1 capsule (500 mg total) by mouth 4 (four) times daily. 10/13/24  Yes Franklyn Sid SAILOR, MD  Hyoscyamine Sulfate SL (LEVSIN/SL) 0.125 MG SUBL Place 1 tablet (0.125 mg total) under the tongue every 4 (four) hours as needed for up to 5 days (For spasm). 10/13/24 10/18/24 Yes Franklyn Sid SAILOR, MD  ondansetron  (ZOFRAN ) 4 MG tablet Take 1 tablet (4 mg total) by mouth every 6 (six) hours. 10/13/24  Yes Franklyn Sid SAILOR, MD  oxyCODONE  (ROXICODONE ) 5 MG immediate release tablet Take 1 tablet (5 mg total) by mouth every 6 (six) hours as needed for up to 18 doses for severe pain (pain score 7-10) or moderate pain (pain score 4-6). 10/13/24  Yes Franklyn Sid SAILOR, MD  tamsulosin (FLOMAX) 0.4 MG CAPS capsule Take 1 capsule (0.4 mg total) by mouth daily after supper. 10/13/24 11/12/24 Yes Franklyn Sid SAILOR, MD  albuterol  (VENTOLIN  HFA) 108 (90 Base) MCG/ACT inhaler 2 PUFFS  INHALATION EVERY 4-6 HRS AS NEEDED COUGH/WHEEZE/1/2 HOUR PRIOR TO EXERCISE 90 DAYS    [provider]  azelastine  (OPTIVAR ) 0.05 % ophthalmic solution Place 1 drop into both eyes as needed.    [provider]  cetirizine (ZYRTEC) 10 MG tablet Take 10 mg by mouth daily.    [provider]  drospirenone-ethinyl estradiol (YAZ,GIANVI,LORYNA) 3-0.02 MG tablet Take 1 tablet by mouth daily.    [provider]  EPINEPHrine 0.3 mg/0.3 mL IJ SOAJ injection Inject 0.3 mg into the muscle once as needed (allergic reaction).     [provider]  fluticasone  furoate-vilanterol (BREO ELLIPTA ) 200-25 MCG/ACT AEPB Inhale 1 puff into the lungs daily. 01/23/23   Neda Hammond A, MD  ibuprofen (ADVIL) 200 MG tablet Take 200 mg by mouth as needed.    [provider]  levocetirizine (XYZAL) 5 MG tablet Take 5 mg by mouth daily.    [provider]  methocarbamol (ROBAXIN) 500 MG tablet Take 500 mg by mouth. 07/23/23   [provider]  methylphenidate  (RITALIN ) 10 MG tablet Take 1 tablet (10 mg total) by mouth 2 (two) times daily. Patient taking differently: Take 10 mg by mouth as needed. 01/23/23   Neda Hammond LABOR, MD  metoCLOPramide  (REGLAN ) 10 MG tablet Take 1 tablet (10 mg total) by mouth every 6 (six) hours.  Patient taking differently: Take 10 mg by mouth as needed. 11/19/23   Raford Lenis, MD  metoprolol  succinate (TOPROL  XL) 25 MG 24 hr tablet Take 0.5 tablets (12.5 mg total) by mouth at bedtime. 08/14/24   Hilty, Vinie BROCKS, MD  Olopatadine HCl 0.2 % SOLN olopatadine 0.2 % eye drops  INSTILL ONE DROP INTO BOTH EYES EVERY DAY Patient taking differently: Place 1 drop into both eyes as needed.    [provider]  ondansetron  (ZOFRAN -ODT) 4 MG disintegrating tablet Take 1 tablet (4 mg total) by mouth every 8 (eight) hours as needed. 11/19/23   Raford Lenis, MD  sertraline  (ZOLOFT ) 50 MG tablet TAKE 1 TABLET BY MOUTH EVERY DAY 09/07/24    Mercer Clotilda SAUNDERS, MD  Spacer/Aero-Holding Chambers (E-Z SPACER) inhaler Use as instructed 02/09/19   Koberlein, Junell C, MD  trimethoprim -polymyxin b  (POLYTRIM ) ophthalmic solution Place 1 drop into the left eye every 4 (four) hours. 06/09/24   Mercer Clotilda SAUNDERS, MD      Allergies    Nickel and Benzoyl peroxide    Review of Systems   Review of Systems A 10 point review of systems was performed and is negative unless otherwise reported in HPI.  Physical Exam Updated Vital Signs BP 118/82 (BP Location: Right Arm)   Pulse 89   Temp 98.3 F (36.8 C) (Oral)   Resp 16   SpO2 97%  Physical Exam General: Normal appearing female, lying in bed.  HEENT: PERRLA, Sclera anicteric, MMM, trachea midline.  Cardiology: RRR, no murmurs/rubs/gallops.  Resp: Normal respiratory rate and effort. CTAB, no wheezes, rhonchi, crackles.  Abd: Soft, non-tender, non-distended. No rebound tenderness or guarding.  GU: Deferred. MSK: No peripheral edema or signs of trauma. Extremities without deformity or TTP. No cyanosis or clubbing. Skin: warm, dry.  Back: +L CVA tenderness Neuro: A&Ox4, CNs II-XII grossly intact. MAEs. Sensation grossly intact.  Psych: Normal mood and affect.   ED Results / Procedures / Treatments   Labs (all labs ordered are listed, but only abnormal results are displayed) Labs Reviewed  URINALYSIS, ROUTINE W REFLEX MICROSCOPIC - Abnormal; Notable for the following components:      Result Value   APPearance CLOUDY (*)    Specific Gravity, Urine 1.033 (*)    Protein, ur 30 (*)    Leukocytes,Ua LARGE (*)    Bacteria, UA FEW (*)    All other components within normal limits  CBC WITH DIFFERENTIAL/PLATELET - Abnormal; Notable for the following components:   WBC 12.7 (*)    Platelets 405 (*)    Neutro Abs 9.5 (*)    All other components within normal limits  COMPREHENSIVE METABOLIC PANEL WITH GFR - Abnormal; Notable for the following components:   Glucose, Bld 121 (*)    Creatinine,  Ser 1.06 (*)    All other components within normal limits  I-STAT CG4 LACTIC ACID, ED - Abnormal; Notable for the following components:   Lactic Acid, Venous 2.2 (*)    All other components within normal limits  URINE CULTURE  CULTURE, BLOOD (ROUTINE X 2)  CULTURE, BLOOD (ROUTINE X 2)  PREGNANCY, URINE  BASIC METABOLIC PANEL WITH GFR  I-STAT CG4 LACTIC ACID, ED    EKG None  Radiology CT Renal Stone Study Result Date: 10/13/2024 CLINICAL DATA:  Abdominal/flank pain, stone suspected EXAM: CT ABDOMEN AND PELVIS WITHOUT CONTRAST TECHNIQUE: Multidetector CT imaging of the abdomen and pelvis was performed following the standard protocol without IV contrast. RADIATION DOSE REDUCTION: This exam was  performed according to the departmental dose-optimization program which includes automated exposure control, adjustment of the mA and/or kV according to patient size and/or use of iterative reconstruction technique. COMPARISON:  11/18/2023 FINDINGS: Of note, the lack of intravenous contrast limits evaluation of the solid organ parenchyma and vascularity. Lower chest: No focal airspace consolidation or pleural effusion. Hepatobiliary: No mass.No radiopaque stones or wall thickening of the gallbladder. No intrahepatic or extrahepatic biliary ductal dilation. Pancreas: No mass or main ductal dilation. No peripancreatic inflammation or fluid collection. Spleen: Normal size. No mass. Adrenals/Urinary Tract: No adrenal masses. No renal mass. Punctate nonobstructive left nephrolithiasis. Obstructive calculus at the left UPJ, measuring 3.5 x 2 x 4 mm causing mild hydronephrosis. The urinary bladder is completely decompressed. Stomach/Bowel: The stomach is decompressed without focal abnormality. No small bowel wall thickening or inflammation. No small bowel obstruction.Normal appendix. Vascular/Lymphatic: No aortic aneurysm. No intraabdominal or pelvic lymphadenopathy. Reproductive: The uterus and ovaries are within  normal limits for patient's age. No free pelvic fluid. Other: No pneumoperitoneum, ascites, or mesenteric inflammation. Musculoskeletal: No acute fracture or destructive lesion. Laminectomy changes with posterior fusion hardware and disc spacer at L4-L5. IMPRESSION: 1. Obstructive calculus at the left UPJ, measuring 3.5 x 2 x 4 mm, causing mild hydronephrosis. 2. A couple of additional, punctate nonobstructive left renal calculi are also present. Electronically Signed   By: Rogelia Myers M.D.   On: 10/13/2024 08:31    Procedures Procedures    Medications Ordered in ED Medications  oxyCODONE -acetaminophen  (PERCOCET/ROXICET) 5-325 MG per tablet 1 tablet (1 tablet Oral Given 10/13/24 1407)  ondansetron  (ZOFRAN -ODT) disintegrating tablet 4 mg (4 mg Oral Given 10/13/24 0656)  cefTRIAXone (ROCEPHIN) 1 g in sodium chloride  0.9 % 100 mL IVPB (0 g Intravenous Stopped 10/13/24 1230)  morphine  (PF) 4 MG/ML injection 4 mg (4 mg Intravenous Given 10/13/24 1131)  sodium chloride  0.9 % bolus 1,000 mL (0 mLs Intravenous Stopped 10/13/24 1405)    ED Course/ Medical Decision Making/ A&P                          Medical Decision Making Amount and/or Complexity of Data Reviewed Labs: ordered. Decision-making details documented in ED Course. Radiology:  Decision-making details documented in ED Course.  Risk Prescription drug management.    This patient presents to the ED for concern of flank pain, this involves an extensive number of treatment options, and is a complaint that carries with it a high risk of complications and morbidity.  I considered the following differential and admission for this acute, potentially life threatening condition.   MDM:    Patient with left flank pain and history of kidney stones that feels similar.  Her CT renal stone study ordered from triage does demonstrate a 3.5 x 2 x 4 cm renal stone that is obstructive causing mild hydronephrosis.  Her urine does appear positive  for infection.  Patient does not have any fever and overall appears well and nontoxic but will order labs and blood cultures including lactate.  She will be given IV ceftriaxone and urine culture will be obtained.  She received oxycodone  and Zofran  in triage and her pain is improved. She was tachycardic on arrival but this improves. Did draw blood cultures d/t SIRS positive but patient is well appearing and I suspect this is likely d/t dehydration more than anything else.   Clinical Course as of 10/13/24 1424  Thu Oct 13, 2024  1022 Urinalysis, Routine w reflex  microscopic -Urine, Clean Catch(!) +UTI, will give IV ceftriaxone and obtain culture. [HN]  1023 CT Renal Stone Study 1. Obstructive calculus at the left UPJ, measuring 3.5 x 2 x 4 mm, causing mild hydronephrosis. 2. A couple of additional, punctate nonobstructive left renal calculi are also present.   [HN]  1227 WBC(!): 12.7 +leukocytosis w/ left shift [HN]  1227 Lactic Acid, Venous(!!): 2.2 Mildly elevated lactic, giving fluids [HN]  1228 Paging to urology [HN]  1328 Repaging to urology [HN]  1330 Discussed with PA Sattenfield who will come to see the patient.  Recommends repeat lactic and BMP after fluids. [HN]  1423 PA sattenfield saw patient and will be discharged. She is eating at bedside and feels wel. He performed SDM with the patient who would prefer to be discharged. She technically was SIRS positive on arrival but she is dehydrated and overall well-appearing. She also had some contamination in her urine and so may not have a UTI. Will treat for UTI to be safe w/ keflex. Will also prescribe tamsulosin, hyoscyamine, oxycodone , and zofran . Instructed to take miralax while taking oxycodone . Advised to f/u with urologist w/in 1 week. Discussed specific return precautions including fevers/chills or worsening sxs [HN]    Clinical Course User Index [HN] Franklyn Sid SAILOR, MD    Labs: I Ordered, and personally interpreted labs.  The  pertinent results include: Those listed above  Imaging Studies ordered: CT renal stone study ordered from triage I independently visualized and interpreted imaging. I agree with the radiologist interpretation  Additional history obtained from chart review, husband at bedside.   Reevaluation: After the interventions noted above, I reevaluated the patient and found that they have :improved  Social Determinants of Health: Lives independently  Disposition:  DC w/ discharge instructions/return precautions. All questions answered to patient's satisfaction.    Co morbidities that complicate the patient evaluation  Past Medical History:  Diagnosis Date   Allergic rhinitis    Allergy 2000   Anxiety 2020   Asthma 2016   H/O infectious mononucleosis    IBS (irritable bowel syndrome)    Kidney stones    Lumbar herniated disc    Narcolepsy without cataplexy(347.00)      Medicines Meds ordered this encounter  Medications   oxyCODONE -acetaminophen  (PERCOCET/ROXICET) 5-325 MG per tablet 1 tablet    Refill:  0   ondansetron  (ZOFRAN -ODT) disintegrating tablet 4 mg   cefTRIAXone (ROCEPHIN) 1 g in sodium chloride  0.9 % 100 mL IVPB    Antibiotic Indication::   UTI   morphine  (PF) 4 MG/ML injection 4 mg   sodium chloride  0.9 % bolus 1,000 mL   Hyoscyamine Sulfate SL (LEVSIN/SL) 0.125 MG SUBL    Sig: Place 1 tablet (0.125 mg total) under the tongue every 4 (four) hours as needed for up to 5 days (For spasm).    Dispense:  30 tablet    Refill:  0   tamsulosin (FLOMAX) 0.4 MG CAPS capsule    Sig: Take 1 capsule (0.4 mg total) by mouth daily after supper.    Dispense:  30 capsule    Refill:  0   oxyCODONE  (ROXICODONE ) 5 MG immediate release tablet    Sig: Take 1 tablet (5 mg total) by mouth every 6 (six) hours as needed for up to 18 doses for severe pain (pain score 7-10) or moderate pain (pain score 4-6).    Dispense:  18 tablet    Refill:  0   ondansetron  (ZOFRAN ) 4 MG tablet  Sig:  Take 1 tablet (4 mg total) by mouth every 6 (six) hours.    Dispense:  12 tablet    Refill:  0   cephALEXin (KEFLEX) 500 MG capsule    Sig: Take 1 capsule (500 mg total) by mouth 4 (four) times daily.    Dispense:  20 capsule    Refill:  0    I have reviewed the patients home medicines and have made adjustments as needed  Problem List / ED Course: Problem List Items Addressed This Visit   None Visit Diagnoses       Ureterolithiasis    -  Primary   Relevant Medications   oxyCODONE -acetaminophen  (PERCOCET/ROXICET) 5-325 MG per tablet 1 tablet (Completed)   morphine  (PF) 4 MG/ML injection 4 mg (Completed)   oxyCODONE  (ROXICODONE ) 5 MG immediate release tablet     Urinary tract infection without hematuria, site unspecified       Relevant Medications   cefTRIAXone (ROCEPHIN) 1 g in sodium chloride  0.9 % 100 mL IVPB (Completed)   cephALEXin (KEFLEX) 500 MG capsule                   This note was created using dictation software, which may contain spelling or grammatical errors.    Franklyn Sid SAILOR, MD 10/13/24 [phone]

## 2024-10-13 NOTE — ED Triage Notes (Signed)
 Pt presents via POV c/o left sided flank pain. Reports hx of kidney stones.

## 2024-10-13 NOTE — Discharge Instructions (Addendum)
 Thank you for coming to [facility] Emergency Department. You were seen for flank pain. We did an exam, labs, and imaging, and these showed sided kidney stone 3.5 x 2 x 4 mm.  He will likely pass this on your own.  You also may have a urinary tract infection.  We have prescribed: -Keflex 500 mg four times daily for 5 days -Zofran  4 mg under the tongue every 6-8 hours as needed for nausea/vomiting -Tamsulosin 0.4 mg every evening to help the stone pass -Hyoscyamine 0.125 mg (one tablet) every 4 hours as needed for bladder spasm -Oxycodone  5 mg every 4-6 hours as needed for severe pain -You can also alternate taking ibuprofen 600 mg every 8 hours and tylenol  1,000 mg every 8 hours for pain. Heat packs can also help. - Please take MiraLAX 1-2 capfuls per day if you are taking the oxycodone  as this can help prevent constipation  Please stay well-hydrated at home.  Please follow up with your primary care provider within 1 week.   Do not hesitate to return to the ED or call 911 if you experience: -Worsening symptoms -Lightheadedness, passing out -Fevers/chills -Nausea vomiting so severe cannot eat or drink anything -Anything else that concerns you

## 2024-10-13 NOTE — Consult Note (Signed)
 Urology Consult Note   Requesting Attending Physician:  Franklyn Sid SAILOR, MD Service Providing Consult: Urology  Consulting Attending: Dr. Nieves   Reason for Consult: Ureteral stone, sepsis rule out  HPI: Lori Mann is seen in consultation for reasons noted above at the request of Franklyn Sid SAILOR, MD. Patient is a 33 y.o. female presenting with left-sided flank pain.  She reports 1 previous obstructing ureteral stone requiring surgical intervention.  She denies fever, chills, night sweats.  She has had intermittent nausea x 2 days.  Denies vomiting.  CT A/P noted 3 x 4 mm proximal left ureteral stone.  On examination she was alert, oriented, no distress.  She was accompanied by her husband.  Reviewed case and plan and all questions were answered to their satisfaction.  ------------------  Assessment:   33 y.o. female with left ureteral stone   Recommendations: # Left ureteral stone Patient initially presented with some concerning signs for developing sepsis, notably tachycardia and lactic acidosis. These are attributable to dehydration, which is also represented by her high specific gravity.  She remains afebrile and heart rate has returned to baseline after administrations of IV fluids.  Urinalysis has some signs of infection but is also contaminated.  I do not believe that she has developing sepsis at this time.  Reviewed available options of cystoscopy with ureteral stent placement versus going home on medical expulsive therapy.  Since she is doing well and also ate this morning, the shared decision was made to proceed home with close follow-up either with our office or if urgently, with the emergency department in the near future.  She will strain all urine and collect stone for stone analysis in clinic if she successfully passes it.  She has a statistical probability of approximately 77% of passage over 21 days without medical intervention.  Case and plan discussed with Dr.  Nieves and he ED team  Past Medical History: Past Medical History:  Diagnosis Date   Allergic rhinitis    Allergy 2000   Anxiety 2020   Asthma 2016   H/O infectious mononucleosis    IBS (irritable bowel syndrome)    Kidney stones    Lumbar herniated disc    Narcolepsy without cataplexy(347.00)     Past Surgical History:  Past Surgical History:  Procedure Laterality Date   CYSTOSCOPY WITH RETROGRADE PYELOGRAM, URETEROSCOPY AND STENT PLACEMENT Right 10/11/2015   Procedure: CYSTOSCOPY WITH RETROGRADE PYELOGRAM, URETEROSCOPY,STONE EXTRACTIONAND STENT PLACEMENT;  Surgeon: Garnette Shack, MD;  Location: [facility];  Service: Urology;  Laterality: Right;   EYE SURGERY  2021   HOLMIUM LASER APPLICATION Right 10/11/2015   Procedure: HOLMIUM LASER APPLICATION;  Surgeon: Garnette Shack, MD;  Location: [facility];  Service: Urology;  Laterality: Right;   NASAL SEPTUM SURGERY     TONSILLECTOMY      Medication: No current facility-administered medications for this encounter.   Current Outpatient Medications  Medication Sig Dispense Refill   albuterol  (VENTOLIN  HFA) 108 (90 Base) MCG/ACT inhaler 2 PUFFS INHALATION EVERY 4-6 HRS AS NEEDED COUGH/WHEEZE/1/2 HOUR PRIOR TO EXERCISE 90 DAYS     azelastine  (OPTIVAR ) 0.05 % ophthalmic solution Place 1 drop into both eyes as needed.     cetirizine (ZYRTEC) 10 MG tablet Take 10 mg by mouth daily.     drospirenone-ethinyl estradiol (YAZ,GIANVI,LORYNA) 3-0.02 MG tablet Take 1 tablet by mouth daily.     EPINEPHrine 0.3 mg/0.3 mL IJ SOAJ injection Inject 0.3 mg into the muscle once  as needed (allergic reaction).      fluticasone  furoate-vilanterol (BREO ELLIPTA ) 200-25 MCG/ACT AEPB Inhale 1 puff into the lungs daily. 60 each 5   ibuprofen (ADVIL) 200 MG tablet Take 200 mg by mouth as needed.     levocetirizine (XYZAL) 5 MG tablet Take 5 mg by mouth daily.     methocarbamol (ROBAXIN) 500 MG tablet Take 500 mg by  mouth.     methylphenidate  (RITALIN ) 10 MG tablet Take 1 tablet (10 mg total) by mouth 2 (two) times daily. (Patient taking differently: Take 10 mg by mouth as needed.) 60 tablet 0   metoCLOPramide  (REGLAN ) 10 MG tablet Take 1 tablet (10 mg total) by mouth every 6 (six) hours. (Patient taking differently: Take 10 mg by mouth as needed.) 30 tablet 0   metoprolol  succinate (TOPROL  XL) 25 MG 24 hr tablet Take 0.5 tablets (12.5 mg total) by mouth at bedtime. 45 tablet 3   Olopatadine HCl 0.2 % SOLN olopatadine 0.2 % eye drops  INSTILL ONE DROP INTO BOTH EYES EVERY DAY (Patient taking differently: Place 1 drop into both eyes as needed.)     ondansetron  (ZOFRAN -ODT) 4 MG disintegrating tablet Take 1 tablet (4 mg total) by mouth every 8 (eight) hours as needed. 20 tablet 0   sertraline  (ZOLOFT ) 50 MG tablet TAKE 1 TABLET BY MOUTH EVERY DAY 90 tablet 1   Spacer/Aero-Holding Chambers (E-Z SPACER) inhaler Use as instructed 1 each 2   trimethoprim -polymyxin b  (POLYTRIM ) ophthalmic solution Place 1 drop into the left eye every 4 (four) hours. 10 mL 0    Allergies: Allergies  Allergen Reactions   Nickel Rash and Dermatitis    Burning rash  Burning rash, Burning rash   Benzoyl Peroxide Rash and Dermatitis    Social History: Social History   Tobacco Use   Smoking status: Never   Smokeless tobacco: Never  Vaping Use   Vaping status: Never Used  Substance Use Topics   Alcohol use: Yes    Alcohol/week: 0.0 standard drinks of alcohol   Drug use: No    Family History Family History  Problem Relation Age of Onset   Cancer Mother        breast ca   Arthritis Father    Hyperlipidemia Father    Alcohol abuse Father    Irritable bowel syndrome Sister     Review of Systems  Genitourinary:  Positive for flank pain. Negative for dysuria, frequency, hematuria and urgency.     Objective   Vital signs in last 24 hours: BP 118/82 (BP Location: Right Arm)   Pulse 89   Temp 98.3 F (36.8 C)  (Oral)   Resp 16   SpO2 97%   Physical Exam General: A&O, resting, appropriate HEENT: [place]/AT Pulmonary: Normal work of breathing Cardiovascular: no cyanosis    Most Recent Labs: Lab Results  Component Value Date   WBC 12.7 (H) 10/13/2024   HGB 14.0 10/13/2024   HCT 41.6 10/13/2024   PLT 405 (H) 10/13/2024    Lab Results  Component Value Date   NA 136 10/13/2024   K 4.2 10/13/2024   CL 100 10/13/2024   CO2 23 10/13/2024   BUN 15 10/13/2024   CREATININE 1.06 (H) 10/13/2024   CALCIUM 9.7 10/13/2024    No results found for: INR, APTT   Urine Culture: @[id number] (laburin,org,[id number],[id number])@   IMAGING: CT Renal Stone Study Result Date: 10/13/2024 CLINICAL DATA:  Abdominal/flank pain, stone suspected EXAM: CT ABDOMEN AND PELVIS WITHOUT CONTRAST TECHNIQUE: Multidetector  CT imaging of the abdomen and pelvis was performed following the standard protocol without IV contrast. RADIATION DOSE REDUCTION: This exam was performed according to the departmental dose-optimization program which includes automated exposure control, adjustment of the mA and/or kV according to patient size and/or use of iterative reconstruction technique. COMPARISON:  11/18/2023 FINDINGS: Of note, the lack of intravenous contrast limits evaluation of the solid organ parenchyma and vascularity. Lower chest: No focal airspace consolidation or pleural effusion. Hepatobiliary: No mass.No radiopaque stones or wall thickening of the gallbladder. No intrahepatic or extrahepatic biliary ductal dilation. Pancreas: No mass or main ductal dilation. No peripancreatic inflammation or fluid collection. Spleen: Normal size. No mass. Adrenals/Urinary Tract: No adrenal masses. No renal mass. Punctate nonobstructive left nephrolithiasis. Obstructive calculus at the left UPJ, measuring 3.5 x 2 x 4 mm causing mild hydronephrosis. The urinary bladder is completely decompressed. Stomach/Bowel: The stomach is decompressed without focal  abnormality. No small bowel wall thickening or inflammation. No small bowel obstruction.Normal appendix. Vascular/Lymphatic: No aortic aneurysm. No intraabdominal or pelvic lymphadenopathy. Reproductive: The uterus and ovaries are within normal limits for patient's age. No free pelvic fluid. Other: No pneumoperitoneum, ascites, or mesenteric inflammation. Musculoskeletal: No acute fracture or destructive lesion. Laminectomy changes with posterior fusion hardware and disc spacer at L4-L5. IMPRESSION: 1. Obstructive calculus at the left UPJ, measuring 3.5 x 2 x 4 mm, causing mild hydronephrosis. 2. A couple of additional, punctate nonobstructive left renal calculi are also present. Electronically Signed   By: Rogelia Myers M.D.   On: 10/13/2024 08:31    ------  Ole Bourdon, NP Pager: [phone]   Please contact the urology consult pager with any further questions/concerns.

## 2024-10-14 LAB — URINE CULTURE

## 2024-10-18 LAB — CULTURE, BLOOD (ROUTINE X 2)
Culture: NO GROWTH
Special Requests: ADEQUATE

## 2024-10-20 ENCOUNTER — Encounter: Admitting: Family Medicine

## 2024-10-23 NOTE — Progress Notes (Signed)
 Chief Complaint: Kidney stone   History of Present Illness:  Lori Mann is a 33 y.o. female who is seen for evaluation of a proximal 4 mm Lt ureteral stone.   Past Medical History:  Past Medical History:  Diagnosis Date   Allergic rhinitis    Allergy 2000   Anxiety 2020   Asthma 2016   H/O infectious mononucleosis    IBS (irritable bowel syndrome)    Kidney stones    Lumbar herniated disc    Narcolepsy without cataplexy(347.00)     Past Surgical History:  Past Surgical History:  Procedure Laterality Date   CYSTOSCOPY WITH RETROGRADE PYELOGRAM, URETEROSCOPY AND STENT PLACEMENT Right 10/11/2015   Procedure: CYSTOSCOPY WITH RETROGRADE PYELOGRAM, URETEROSCOPY,STONE EXTRACTIONAND STENT PLACEMENT;  Surgeon: Garnette Shack, MD;  Location: [facility];  Service: Urology;  Laterality: Right;   EYE SURGERY  2021   HOLMIUM LASER APPLICATION Right 10/11/2015   Procedure: HOLMIUM LASER APPLICATION;  Surgeon: Garnette Shack, MD;  Location: [facility];  Service: Urology;  Laterality: Right;   NASAL SEPTUM SURGERY     TONSILLECTOMY      Allergies:  Allergies  Allergen Reactions   Nickel Rash and Dermatitis    Burning rash  Burning rash, Burning rash   Benzoyl Peroxide Rash and Dermatitis    Family History:  Family History  Problem Relation Age of Onset   Cancer Mother        breast ca   Arthritis Father    Hyperlipidemia Father    Alcohol abuse Father    Irritable bowel syndrome Sister     Social History:  Social History   Tobacco Use   Smoking status: Never   Smokeless tobacco: Never  Vaping Use   Vaping status: Never Used  Substance Use Topics   Alcohol use: Yes    Alcohol/week: 0.0 standard drinks of alcohol   Drug use: No    Review of symptoms:  Constitutional:  Negative for unexplained weight loss, night sweats, fever, chills ENT:  Negative for nose bleeds, sinus pain, painful swallowing CV:  Negative for  chest pain, shortness of breath, exercise intolerance, palpitations, loss of consciousness Resp:  Negative for cough, wheezing, shortness of breath GI:  Negative for nausea, vomiting, diarrhea, bloody stools GU:  Positives noted in HPI; otherwise negative for gross hematuria, dysuria, urinary incontinence Neuro:  Negative for seizures, poor balance, limb weakness, slurred speech Psych:  Negative for lack of energy, depression, anxiety Endocrine:  Negative for polydipsia, polyuria, symptoms of hypoglycemia (dizziness, hunger, sweating) Hematologic:  Negative for anemia, purpura, petechia, prolonged or excessive bleeding, use of anticoagulants  Allergic:  Negative for difficulty breathing or choking as a result of exposure to anything; no shellfish allergy; no allergic response (rash/itch) to materials, foods  Physical exam: There were no vitals taken for this visit. GENERAL APPEARANCE:  Well appearing, well developed, well nourished, NAD HEENT: Atraumatic, Normocephalic. NECK: Normal appearance LUNGS: Normal inspiratory and expiratory excursion HEART: Regular Rate ABDOMEN: *** EXTREMITIES: Moves all extremities well.  Without clubbing, cyanosis, or edema. NEUROLOGIC:  Alert and oriented x 3, normal gait, CN II-XII grossly intact.  MENTAL STATUS:  Appropriate. SKIN:  Warm, dry and intact.    Results: No results found for this or any previous visit (from the past 24 hours).  I have reviewed prior patient's records  I have reviewed referring/prior physicians records  I have reviewed urinalysis  I have reviewed prior urine cultures  I reviewed prior  imaging studies--CT stone protocol from 10.23.2025- 1. Obstructive calculus at the left UPJ, measuring 3.5 x 2 x 4 mm, causing mild hydronephrosis. 2. A couple of additional, punctate nonobstructive left renal calculi are also present.   Assessment: No diagnosis found.   Plan: ***

## 2024-10-24 ENCOUNTER — Ambulatory Visit (HOSPITAL_BASED_OUTPATIENT_CLINIC_OR_DEPARTMENT_OTHER)
Admission: RE | Admit: 2024-10-24 | Discharge: 2024-10-24 | Disposition: A | Discharge status: Home / Self Care | Source: Ambulatory Visit | Attending: Urology | Admitting: Urology

## 2024-10-24 ENCOUNTER — Ambulatory Visit (INDEPENDENT_AMBULATORY_CARE_PROVIDER_SITE_OTHER): Admitting: Urology

## 2024-10-24 VITALS — BP 112/67 | HR 106 | Ht 66.0 in | Wt 210.0 lb

## 2024-10-24 DIAGNOSIS — N201 Calculus of ureter: Secondary | ICD-10-CM | POA: Insufficient documentation

## 2024-10-24 LAB — URINALYSIS, ROUTINE W REFLEX MICROSCOPIC
Bilirubin, UA: NEGATIVE
Glucose, UA: NEGATIVE
Ketones, UA: NEGATIVE
Nitrite, UA: NEGATIVE
Protein,UA: NEGATIVE
RBC, UA: NEGATIVE
Specific Gravity, UA: 1.02 (ref 1.005–1.030)
Urobilinogen, Ur: 0.2 mg/dL (ref 0.2–1.0)
pH, UA: 5 (ref 5.0–7.5)

## 2024-10-24 LAB — MICROSCOPIC EXAMINATION

## 2024-10-27 ENCOUNTER — Telehealth: Payer: Self-pay

## 2024-10-27 NOTE — Telephone Encounter (Signed)
 Spoke with pt who stated she would like to proceed with ESWL. Spoke with Dr. Matilda who stated he can do surgery. Surgery has been scheduled and pt has filled out forms.

## 2024-10-27 NOTE — Telephone Encounter (Signed)
-----   Message from Garnette HERO Dahlstedt sent at 10/24/2024  4:33 PM EST ----- Please call Lori Mann-I looked at her x-ray.  It still looks like it is pretty high up along the kidney tube.  I am fine with her waiting a bit longer if she wants to see if the stone passes, especially since she is not having a lot of pain.  Also okay to proceed with lithotripsy if she wants.  I would favor waiting a little bit longer, however.

## 2024-10-28 ENCOUNTER — Encounter (HOSPITAL_COMMUNITY): Payer: Self-pay | Admitting: Urology

## 2024-10-28 NOTE — Progress Notes (Signed)
 LITHO PREOP PHONE CALL   ALLERGIES REVIEWED: YES  MEDICATION REVIEW DONE: YES MEDICATIONS THAT PT SHOULD HOLD (LIST): Wegovy  hold 1 week last dose 10/26. NSAIDS/Ibuprofen hold 48hrs  CAN PT WALK UP STAIRS WITHOUT SHORTNESS OF BREATH: YES HOME O2: NO CPAP: NO  IF YES, INFORMED PT TO BRING CPAP WITH TUBING AND MASK:YES/NO   INFORMED DRIVER NEEDED FOR PROCEDURE: YES   PT WAS GIVEN BLUE FOLDER AT UROLOGY APPT: YES PT INFORMED TO BRING BLUE FOLDER WITH ALL CONTENTS: YES  REVIEWED ARRIVAL TIME AND LOCATION: YES  OTHER PERTINENT INFORMATION:

## 2024-10-30 ENCOUNTER — Ambulatory Visit: Payer: Self-pay | Admitting: Urology

## 2024-10-30 DIAGNOSIS — N201 Calculus of ureter: Secondary | ICD-10-CM

## 2024-10-31 ENCOUNTER — Other Ambulatory Visit: Payer: Self-pay

## 2024-10-31 ENCOUNTER — Encounter (HOSPITAL_COMMUNITY): Payer: Self-pay | Admitting: Urology

## 2024-10-31 ENCOUNTER — Ambulatory Visit (HOSPITAL_COMMUNITY)

## 2024-10-31 ENCOUNTER — Other Ambulatory Visit: Payer: Self-pay | Admitting: Urology

## 2024-10-31 ENCOUNTER — Encounter (HOSPITAL_BASED_OUTPATIENT_CLINIC_OR_DEPARTMENT_OTHER): Admitting: Pulmonary Disease

## 2024-10-31 ENCOUNTER — Encounter (HOSPITAL_COMMUNITY)
Admission: EM | Disposition: A | Discharge status: Home / Self Care | Payer: Self-pay | Source: Home / Self Care | Attending: Urology

## 2024-10-31 ENCOUNTER — Encounter (HOSPITAL_COMMUNITY)
Admission: RE | Disposition: A | Discharge status: Home / Self Care | Payer: Self-pay | Source: Home / Self Care | Attending: Urology

## 2024-10-31 ENCOUNTER — Ambulatory Visit (HOSPITAL_COMMUNITY)
Admission: EM | Admit: 2024-10-31 | Discharge: 2024-10-31 | Disposition: A | Discharge status: Home / Self Care | Source: Home / Self Care | Attending: Urology | Admitting: Urology

## 2024-10-31 ENCOUNTER — Ambulatory Visit (HOSPITAL_COMMUNITY)
Admission: RE | Admit: 2024-10-31 | Discharge: 2024-10-31 | Disposition: A | Discharge status: Home / Self Care | Attending: Urology | Admitting: Urology

## 2024-10-31 ENCOUNTER — Ambulatory Visit (HOSPITAL_COMMUNITY): Admitting: Certified Registered Nurse Anesthetist

## 2024-10-31 DIAGNOSIS — J45909 Unspecified asthma, uncomplicated: Secondary | ICD-10-CM | POA: Diagnosis not present

## 2024-10-31 DIAGNOSIS — G47419 Narcolepsy without cataplexy: Secondary | ICD-10-CM | POA: Diagnosis not present

## 2024-10-31 DIAGNOSIS — N201 Calculus of ureter: Secondary | ICD-10-CM

## 2024-10-31 DIAGNOSIS — N202 Calculus of kidney with calculus of ureter: Secondary | ICD-10-CM | POA: Diagnosis not present

## 2024-10-31 DIAGNOSIS — Z6833 Body mass index (BMI) 33.0-33.9, adult: Secondary | ICD-10-CM | POA: Diagnosis not present

## 2024-10-31 DIAGNOSIS — E669 Obesity, unspecified: Secondary | ICD-10-CM | POA: Diagnosis not present

## 2024-10-31 DIAGNOSIS — N390 Urinary tract infection, site not specified: Secondary | ICD-10-CM | POA: Diagnosis not present

## 2024-10-31 DIAGNOSIS — Z538 Procedure and treatment not carried out for other reasons: Secondary | ICD-10-CM | POA: Insufficient documentation

## 2024-10-31 DIAGNOSIS — I10 Essential (primary) hypertension: Secondary | ICD-10-CM | POA: Diagnosis not present

## 2024-10-31 HISTORY — PX: EXTRACORPOREAL SHOCK WAVE LITHOTRIPSY: SHX1557

## 2024-10-31 HISTORY — PX: CYSTOSCOPY W/ URETERAL STENT PLACEMENT: SHX1429

## 2024-10-31 LAB — URINALYSIS, ROUTINE W REFLEX MICROSCOPIC
Bilirubin Urine: NEGATIVE
Glucose, UA: NEGATIVE mg/dL
Hgb urine dipstick: NEGATIVE
Ketones, ur: NEGATIVE mg/dL
Nitrite: NEGATIVE
Protein, ur: NEGATIVE mg/dL
Specific Gravity, Urine: 1.02 (ref 1.005–1.030)
pH: 5 (ref 5.0–8.0)

## 2024-10-31 LAB — POCT PREGNANCY, URINE: Preg Test, Ur: NEGATIVE

## 2024-10-31 SURGERY — CYSTOSCOPY, WITH RETROGRADE PYELOGRAM AND URETERAL STENT INSERTION
Anesthesia: General | Laterality: Left

## 2024-10-31 SURGERY — LITHOTRIPSY, ESWL
Anesthesia: LOCAL | Laterality: Left

## 2024-10-31 MED ORDER — ONDANSETRON HCL 4 MG/2ML IJ SOLN: 4.0000 mg | Freq: Once | INTRAMUSCULAR | Status: DC | PRN

## 2024-10-31 MED ORDER — DIAZEPAM 5 MG PO TABS
10.0000 mg | ORAL_TABLET | ORAL | Status: AC
  Administered 2024-10-31: 10 mg via ORAL
  Filled 2024-10-31: qty 2

## 2024-10-31 MED ORDER — FENTANYL CITRATE (PF) 50 MCG/ML IJ SOSY: 25.0000 ug | PREFILLED_SYRINGE | INTRAMUSCULAR | Status: DC | PRN

## 2024-10-31 MED ORDER — IOHEXOL 300 MG/ML  SOLN
INTRAMUSCULAR | Status: DC | PRN
  Administered 2024-10-31: 7 mL via URETHRAL

## 2024-10-31 MED ORDER — CHLORHEXIDINE GLUCONATE 0.12 % MT SOLN
15.0000 mL | Freq: Once | OROMUCOSAL | Status: AC
Start: 2024-10-31 — End: 2024-10-31
  Administered 2024-10-31: 15 mL via OROMUCOSAL

## 2024-10-31 MED ORDER — DEXAMETHASONE SOD PHOSPHATE PF 10 MG/ML IJ SOLN
INTRAMUSCULAR | Status: DC | PRN
  Administered 2024-10-31: 10 mg via INTRAVENOUS

## 2024-10-31 MED ORDER — HYOSCYAMINE SULFATE 0.125 MG PO TABS: 0.1250 mg | ORAL_TABLET | ORAL | 0 refills | Status: DC | PRN

## 2024-10-31 MED ORDER — PROPOFOL 10 MG/ML IV BOLUS
INTRAVENOUS | Status: AC
  Filled 2024-10-31: qty 20

## 2024-10-31 MED ORDER — CEFAZOLIN SODIUM-DEXTROSE 1-4 GM/50ML-% IV SOLN: 1.0000 g | Freq: Once | INTRAVENOUS | Status: DC

## 2024-10-31 MED ORDER — SULFAMETHOXAZOLE-TRIMETHOPRIM 800-160 MG PO TABS: 1.0000 | ORAL_TABLET | Freq: Two times a day (BID) | ORAL | 0 refills | Status: DC

## 2024-10-31 MED ORDER — PROPOFOL 10 MG/ML IV BOLUS
INTRAVENOUS | Status: DC | PRN
  Administered 2024-10-31: 200 mg via INTRAVENOUS

## 2024-10-31 MED ORDER — FENTANYL CITRATE (PF) 250 MCG/5ML IJ SOLN
INTRAMUSCULAR | Status: DC | PRN
  Administered 2024-10-31 (×2): 50 ug via INTRAVENOUS

## 2024-10-31 MED ORDER — FENTANYL CITRATE (PF) 100 MCG/2ML IJ SOLN
INTRAMUSCULAR | Status: AC
  Filled 2024-10-31: qty 2

## 2024-10-31 MED ORDER — MIDAZOLAM HCL 2 MG/2ML IJ SOLN
INTRAMUSCULAR | Status: AC
  Filled 2024-10-31: qty 2

## 2024-10-31 MED ORDER — AMISULPRIDE (ANTIEMETIC) 5 MG/2ML IV SOLN: 10.0000 mg | Freq: Once | INTRAVENOUS | Status: DC | PRN

## 2024-10-31 MED ORDER — LACTATED RINGERS IV SOLN: INTRAVENOUS | Status: DC

## 2024-10-31 MED ORDER — LIDOCAINE 2% (20 MG/ML) 5 ML SYRINGE
INTRAMUSCULAR | Status: DC | PRN
  Administered 2024-10-31: 100 mg via INTRAVENOUS

## 2024-10-31 MED ORDER — ONDANSETRON HCL 4 MG/2ML IJ SOLN
INTRAMUSCULAR | Status: DC | PRN
  Administered 2024-10-31: 4 mg via INTRAVENOUS

## 2024-10-31 MED ORDER — SODIUM CHLORIDE 0.9 % IV SOLN: INTRAVENOUS | Status: DC

## 2024-10-31 MED ORDER — TRAMADOL HCL 50 MG PO TABS: 50.0000 mg | ORAL_TABLET | Freq: Four times a day (QID) | ORAL | 0 refills | Status: AC | PRN

## 2024-10-31 MED ORDER — MIDAZOLAM HCL (PF) 2 MG/2ML IJ SOLN
INTRAMUSCULAR | Status: DC | PRN
  Administered 2024-10-31: 2 mg via INTRAVENOUS

## 2024-10-31 MED ORDER — TRAMADOL HCL 50 MG PO TABS: 50.0000 mg | ORAL_TABLET | Freq: Four times a day (QID) | ORAL | 0 refills | Status: DC | PRN

## 2024-10-31 MED ORDER — ORAL CARE MOUTH RINSE: 15.0000 mL | Freq: Once | OROMUCOSAL | Status: AC

## 2024-10-31 MED ORDER — SODIUM CHLORIDE 0.9 % IR SOLN
Status: DC | PRN
  Administered 2024-10-31: 3000 mL via INTRAVESICAL

## 2024-10-31 MED ORDER — ACETAMINOPHEN 10 MG/ML IV SOLN
INTRAVENOUS | Status: AC
  Filled 2024-10-31: qty 100

## 2024-10-31 MED ORDER — DIPHENHYDRAMINE HCL 25 MG PO CAPS
25.0000 mg | ORAL_CAPSULE | ORAL | Status: AC
  Administered 2024-10-31: 25 mg via ORAL
  Filled 2024-10-31: qty 1

## 2024-10-31 MED ORDER — CEFAZOLIN SODIUM-DEXTROSE 2-3 GM-%(50ML) IV SOLR
INTRAVENOUS | Status: DC | PRN
  Administered 2024-10-31: 2 g via INTRAVENOUS

## 2024-10-31 MED ORDER — ACETAMINOPHEN 10 MG/ML IV SOLN
INTRAVENOUS | Status: DC | PRN
  Administered 2024-10-31: 1000 mg via INTRAVENOUS

## 2024-10-31 SURGICAL SUPPLY — 11 items
BAG URO CATCHER STRL LF (MISCELLANEOUS) ×2 IMPLANT
CATH URETL OPEN 5X70 (CATHETERS) ×2 IMPLANT
CLOTH BEACON ORANGE TIMEOUT ST (SAFETY) ×2 IMPLANT
GLOVE BIO SURGEON STRL SZ7 (GLOVE) ×2 IMPLANT
GOWN STRL REUS W/ TWL XL LVL3 (GOWN DISPOSABLE) ×2 IMPLANT
GUIDEWIRE ZIPWRE .038 STRAIGHT (WIRE) ×2 IMPLANT
KIT TURNOVER KIT A (KITS) ×2 IMPLANT
MANIFOLD NEPTUNE II (INSTRUMENTS) ×2 IMPLANT
PACK CYSTO (CUSTOM PROCEDURE TRAY) ×2 IMPLANT
STENT URET 6FRX24 CONTOUR (STENTS) IMPLANT
TUBING CONNECTING 10 (TUBING) ×2 IMPLANT

## 2024-10-31 SURGICAL SUPPLY — 4 items
BAG COUNTER SPONGE SURGICOUNT (BAG) IMPLANT
COVER SURGICAL LIGHT HANDLE (MISCELLANEOUS) ×2 IMPLANT
PENCIL SMOKE EVACUATOR (MISCELLANEOUS) IMPLANT
TOWEL OR DSP ST BLU DLX 10/PK (DISPOSABLE) ×2 IMPLANT

## 2024-10-31 NOTE — Anesthesia Preprocedure Evaluation (Addendum)
 Anesthesia Evaluation  Patient identified by MRN, date of birth, ID band Patient awake    Reviewed: Allergy & Precautions, NPO status , Patient's Chart, lab work & pertinent test results, reviewed documented beta blocker date and time   Airway Mallampati: II  TM Distance: >3 FB Neck ROM: Full    Dental  (+) Teeth Intact, Dental Advisory Given   Pulmonary asthma    Pulmonary exam normal breath sounds clear to auscultation       Cardiovascular hypertension, Pt. on home beta blockers (-) angina (-) Past MI Normal cardiovascular exam Rhythm:Regular Rate:Normal     Neuro/Psych  PSYCHIATRIC DISORDERS Anxiety     Narcolepsy without cataplexy    GI/Hepatic negative GI ROS, Neg liver ROS,,,  Endo/Other  Obesity   Renal/GU Renal disease (Kidney stones)     Musculoskeletal negative musculoskeletal ROS (+)    Abdominal   Peds  (+) ATTENTION DEFICIT DISORDER WITHOUT HYPERACTIVITY Hematology negative hematology ROS (+)   Anesthesia Other Findings Day of surgery medications reviewed with the patient.  Reproductive/Obstetrics negative OB ROS                              Anesthesia Physical Anesthesia Plan  ASA: 2 and emergent  Anesthesia Plan: General   Post-op Pain Management: Ofirmev  IV (intra-op)* and Toradol  IV (intra-op)*   Induction: Intravenous  PONV Risk Score and Plan: 4 or greater and Midazolam , Dexamethasone  and Ondansetron   Airway Management Planned: LMA  Additional Equipment:   Intra-op Plan:   Post-operative Plan: Extubation in OR  Informed Consent: I have reviewed the patients History and Physical, chart, labs and discussed the procedure including the risks, benefits and alternatives for the proposed anesthesia with the patient or authorized representative who has indicated his/her understanding and acceptance.     Dental advisory given  Plan Discussed with:  CRNA  Anesthesia Plan Comments:          Anesthesia Quick Evaluation

## 2024-10-31 NOTE — Op Note (Signed)
 Operative Note  Preoperative diagnosis:  1.  Left obstructing ureteral stone 2. UTI  Postoperative diagnosis: 1.  same  Procedure(s): 1.  Cystoscopy with left ureteral stent placement 2. left retrograde pyelogram with interpretation 3. Fluoroscopy <1 hour with intraoperative interpretation  Surgeon: Herlene Foot, MD  Assistants:  None  Anesthesia:  General  Complications:  None  EBL:  minimal  Specimens: 1. None  Drains/Catheters: 1.  Left 6Fr x 24cm ureteral stent  Intraoperative findings:   Cystoscopy demonstrated no suspicious lesions, masses, stones or other pathology. Left Retrograde pyelogram demonstrated very mild hydronephrosis. Stone was visible in distal ureter on initial scout film Successful left ureteral stent placement with curl in the renal pelvis and bladder respectively.  Indication:  Lori Mann is a 33 y.o. female with the above diagnoses. After reviewing the management options for treatment, she elected to proceed with the above surgical procedure(s). We have discussed the potential benefits and risks of the procedure, side effects of the proposed treatment, the likelihood of the patient achieving the goals of the procedure, and any potential problems that might occur during the procedure or recuperation. Informed consent has been obtained.  Description of procedure: The patient was taken to the operating room and general anesthesia was induced.  The patient was placed in the dorsal lithotomy position, prepped and draped in the usual sterile fashion, and preoperative antibiotics were administered. A preoperative time-out was performed.   Cystourethroscopy was performed.  The patient's urethra was examined and was normal. The bladder was then systematically examined in its entirety. There was no evidence for any bladder tumors, stones, or other mucosal pathology.    Attention then turned to the left ureteral orifice. A 0.038 zip wire was passed through  the left orifice and over the wire a 5 Fr open ended catheter was inserted and passed up to the level of the renal pelvis. Omnipaque  contrast was injected through the ureteral catheter and a retrograde pyelogram was performed with findings as dictated above. The wire was then replaced and the open ended catheter was removed.   A 6Fr x 24cm ureteral stent was advance over the wire. The stent was positioned appropriately under fluoroscopic and cystoscopic guidance.  The wire was then removed with an adequate stent curl noted in the renal pelvis as well as in the bladder.  The bladder was then emptied and the procedure ended.  The patient appeared to tolerate the procedure well and without complications.  The patient was able to be awakened and transferred to the recovery unit in satisfactory condition.   Plan:   Will arrange for ureteroscopy with laser litho as outpatient  Lori Mann Herlene Foot MD [facility]  Pager: [phone]

## 2024-10-31 NOTE — H&P (View-Only) (Signed)
 H&P  Chief Complaint: Kidney stone  History of Present Illness: This 33 year old female presents for shockwave lithotripsy of a symptomatic 4 mm left proximal ureteral calculus.  Past Medical History:  Diagnosis Date   Allergic rhinitis    Allergy 2000   Anxiety 2020   Asthma 2016   H/O infectious mononucleosis    IBS (irritable bowel syndrome)    Kidney stones    Lumbar herniated disc    Narcolepsy without cataplexy(347.00)     Past Surgical History:  Procedure Laterality Date   CYSTOSCOPY WITH RETROGRADE PYELOGRAM, URETEROSCOPY AND STENT PLACEMENT Right 10/11/2015   Procedure: CYSTOSCOPY WITH RETROGRADE PYELOGRAM, URETEROSCOPY,STONE EXTRACTIONAND STENT PLACEMENT;  Surgeon: Garnette Shack, MD;  Location: [facility];  Service: Urology;  Laterality: Right;   EYE SURGERY  2021   HOLMIUM LASER APPLICATION Right 10/11/2015   Procedure: HOLMIUM LASER APPLICATION;  Surgeon: Garnette Shack, MD;  Location: [facility];  Service: Urology;  Laterality: Right;   NASAL SEPTUM SURGERY     TONSILLECTOMY      Home Medications:  No medications prior to admission.    Allergies:  Allergies  Allergen Reactions   Nickel Rash and Dermatitis    Burning rash  Burning rash, Burning rash   Benzoyl Peroxide Rash and Dermatitis    Family History  Problem Relation Age of Onset   Cancer Mother        breast ca   Arthritis Father    Hyperlipidemia Father    Alcohol abuse Father    Irritable bowel syndrome Sister     Social History:  reports that she has never smoked. She has never used smokeless tobacco. She reports current alcohol use. She reports that she does not use drugs.  ROS: A complete review of systems was performed.  All systems are negative except for pertinent findings as noted.  Physical Exam:  Vital signs in last 24 hours:   General:  Alert and oriented, No acute distress HEENT: Normocephalic, atraumatic Neck: No JVD or  lymphadenopathy Cardiovascular: Regular rate  Lungs: Normal inspiratory/expiratory excursion Extremities: No edema Neurologic: Grossly intact  I have reviewed prior pt notes  I have reviewed urinalysis results  I have independently reviewed prior imaging--KUB reviewed   Impression/Assessment:  Symptomatic left ureteral stone  Plan:  Shockwave lithotripsy.  The patient understands the procedure and expected outcomes.  She is also aware that this is the first step of a possible staged  process.  Garnette HERO Kaycee Haycraft 10/31/2024, 11:30 AM  Garnette HERO. Delmer Kowalski MD

## 2024-10-31 NOTE — Discharge Instructions (Addendum)

## 2024-10-31 NOTE — Anesthesia Procedure Notes (Signed)
 Procedure Name: LMA Insertion Date/Time: 10/31/2024 7:44 PM  Performed by: Cena Epps, CRNAPre-anesthesia Checklist: Patient identified, Emergency Drugs available, Suction available and Patient being monitored Patient Re-evaluated:Patient Re-evaluated prior to induction Oxygen Delivery Method: Circle System Utilized Preoxygenation: Pre-oxygenation with 100% oxygen Induction Type: IV induction Ventilation: Mask ventilation without difficulty LMA: LMA inserted LMA Size: 4.0 Number of attempts: 1 Airway Equipment and Method: Bite block Placement Confirmation: positive ETCO2 Tube secured with: Tape Dental Injury: Teeth and Oropharynx as per pre-operative assessment

## 2024-10-31 NOTE — H&P (Signed)
 H&P  Chief Complaint: Kidney stone  History of Present Illness: This 33 year old female presents for shockwave lithotripsy of a symptomatic 4 mm left proximal ureteral calculus.  Past Medical History:  Diagnosis Date   Allergic rhinitis    Allergy 2000   Anxiety 2020   Asthma 2016   H/O infectious mononucleosis    IBS (irritable bowel syndrome)    Kidney stones    Lumbar herniated disc    Narcolepsy without cataplexy(347.00)     Past Surgical History:  Procedure Laterality Date   CYSTOSCOPY WITH RETROGRADE PYELOGRAM, URETEROSCOPY AND STENT PLACEMENT Right 10/11/2015   Procedure: CYSTOSCOPY WITH RETROGRADE PYELOGRAM, URETEROSCOPY,STONE EXTRACTIONAND STENT PLACEMENT;  Surgeon: Garnette Shack, MD;  Location: [facility];  Service: Urology;  Laterality: Right;   EYE SURGERY  2021   HOLMIUM LASER APPLICATION Right 10/11/2015   Procedure: HOLMIUM LASER APPLICATION;  Surgeon: Garnette Shack, MD;  Location: [facility];  Service: Urology;  Laterality: Right;   NASAL SEPTUM SURGERY     TONSILLECTOMY      Home Medications:  No medications prior to admission.    Allergies:  Allergies  Allergen Reactions   Nickel Rash and Dermatitis    Burning rash  Burning rash, Burning rash   Benzoyl Peroxide Rash and Dermatitis    Family History  Problem Relation Age of Onset   Cancer Mother        breast ca   Arthritis Father    Hyperlipidemia Father    Alcohol abuse Father    Irritable bowel syndrome Sister     Social History:  reports that she has never smoked. She has never used smokeless tobacco. She reports current alcohol use. She reports that she does not use drugs.  ROS: A complete review of systems was performed.  All systems are negative except for pertinent findings as noted.  Physical Exam:  Vital signs in last 24 hours:   General:  Alert and oriented, No acute distress HEENT: Normocephalic, atraumatic Neck: No JVD or  lymphadenopathy Cardiovascular: Regular rate  Lungs: Normal inspiratory/expiratory excursion Extremities: No edema Neurologic: Grossly intact  I have reviewed prior pt notes  I have reviewed urinalysis results  I have independently reviewed prior imaging--KUB reviewed   Impression/Assessment:  Symptomatic left ureteral stone  Plan:  Shockwave lithotripsy.  The patient understands the procedure and expected outcomes.  She is also aware that this is the first step of a possible staged  process.  Garnette HERO Kaycee Haycraft 10/31/2024, 11:30 AM  Garnette HERO. Delmer Kowalski MD

## 2024-10-31 NOTE — Interval H&P Note (Signed)
 History and Physical Interval Note:  10/31/2024 7:13 PM  Lori Mann  has presented today for surgery, with the diagnosis of left ureteral stone. Patient was initially scheduled for ESWL but cancelled due to suspected UTI. Returns for stent placement given obstructing stone + UTI   The various methods of treatment have been discussed with the patient and family. After consideration of risks, benefits and other options for treatment, the patient has consented to  Procedure(s): CYSTOSCOPY, WITH RETROGRADE PYELOGRAM AND URETERAL STENT INSERTION (Left) as a surgical intervention.  The patient's history has been reviewed, patient examined, no change in status, stable for surgery.  I have reviewed the patient's chart and labs.  Questions were answered to the patient's satisfaction.     Ponciano Shealy L Odessia Asleson

## 2024-10-31 NOTE — Progress Notes (Signed)
 Lori Mann Nursing Note: Attending MD called with results of urine spec, immediately instructed client to return for Ureter Stent placement. Spoke with pt directly and patient returning to Short Stay Dept, instructed to remain NPO.

## 2024-10-31 NOTE — Transfer of Care (Signed)
 Immediate Anesthesia Transfer of Care Note  Patient: Lori Mann  Procedure(s) Performed: CYSTOSCOPY, WITH RETROGRADE PYELOGRAM AND URETERAL STENT INSERTION (Left)  Patient Location: PACU  Anesthesia Type:General  Level of Consciousness: drowsy  Airway & Oxygen Therapy: Patient Spontanous Breathing  Post-op Assessment: Report given to RN and Post -op Vital signs reviewed and stable  Post vital signs: Reviewed and stable  Last Vitals:  Vitals Value Taken Time  BP    Temp    Pulse    Resp    SpO2      Last Pain:  Vitals:   10/31/24 1915  PainSc: 3          Complications: No notable events documented.

## 2024-10-31 NOTE — Progress Notes (Signed)
 [facility] Nursing Note: client in for ESWL for Dr. CANDIE Shack, MD, case was cancelled per MD. Client did demonstrate slight tachycardia of HR 102-104/ min and oral temp of 99.62F and 99.59F, MD aware. Client complains of frequency, urgency and burning upon voiding, per MD, urine sample obtained, noted to be slightly cloudy and odor also noted. Sample hand carried to lab for processing, at this note time, results not pending, when results received will notify MD as per his request. Pt was DC to home, DC instructions provided. Informed to increase PO fluids, to restart taking flomax per MD orders. Also instructed client to monitor temperature. Husband at bedside while DC instructions reviewed and discussed. Opportunity for questions provided multiple times. Client able to ambulate without assistance, mild pain (3 on 0-10 scale at left flank) at discharge, tolerated PO fluids. Escorted to exit via wheelchair due to rec preop medications.

## 2024-10-31 NOTE — Op Note (Signed)
 Procedure cancelled as stone is now @ [facility]--will hopefully pass son.

## 2024-11-01 ENCOUNTER — Encounter (HOSPITAL_COMMUNITY): Payer: Self-pay | Admitting: Urology

## 2024-11-01 NOTE — Anesthesia Postprocedure Evaluation (Signed)
 Anesthesia Post Note  Patient: Lori Mann  Procedure(s) Performed: CYSTOSCOPY, WITH RETROGRADE PYELOGRAM AND URETERAL STENT INSERTION (Left)     Patient location during evaluation: PACU Anesthesia Type: General Level of consciousness: awake and alert Pain management: pain level controlled Vital Signs Assessment: post-procedure vital signs reviewed and stable Respiratory status: spontaneous breathing, nonlabored ventilation and respiratory function stable Cardiovascular status: blood pressure returned to baseline and stable Postop Assessment: no apparent nausea or vomiting Anesthetic complications: no   No notable events documented.  Last Vitals:  Vitals:   10/31/24 2050 10/31/24 2057  BP:  122/85  Pulse: 88 89  Resp: 17 15  Temp:  36.9 C  SpO2: 97% 98%    Last Pain:  Vitals:   10/31/24 2045  PainSc: 3                  Garnette [name]

## 2024-11-04 NOTE — Progress Notes (Signed)
 Surgery orders requested via Epic inbox.

## 2024-11-04 NOTE — Progress Notes (Addendum)
 Anesthesia Review:  PCP: Clotilda Single  LOV 08/19/24  Cardiologist : Hilty - LOV 06/15/24 - followed for high heart rate per pt   PPM/ ICD: Device Orders: Rep Notified:  Chest x-ray : EKG : 06/15/24  Echo : 07/20/24  Stress test: Cardiac Cath :   Activity level: cannot do a flgiht of stairs without using her inhaler due to asthma per pt  Sleep Study/ CPAP : none  Fasting Blood Sugar :      / Checks Blood Sugar -- times a day:     Wegovy - last dose on 10/16/24.   Blood Thinner/ Instructions Cherre Dose: ASA / Instructions/ Last Dose :    10/31/24- cysto and litho    Narcolepsy

## 2024-11-07 ENCOUNTER — Ambulatory Visit: Admitting: Urology

## 2024-11-07 ENCOUNTER — Ambulatory Visit: Payer: Self-pay | Admitting: Urology

## 2024-11-07 ENCOUNTER — Other Ambulatory Visit: Payer: Self-pay | Admitting: Urology

## 2024-11-07 DIAGNOSIS — N201 Calculus of ureter: Secondary | ICD-10-CM

## 2024-11-07 NOTE — Patient Instructions (Signed)
 SURGICAL WAITING ROOM VISITATION  Patients having surgery or a procedure may have no more than 2 support people in the waiting area - these visitors may rotate.    Children under the age of 68 must have an adult with them who is not the patient.  Visitors with respiratory illnesses are discouraged from visiting and should remain at home.  If the patient needs to stay at the hospital during part of their recovery, the visitor guidelines for inpatient rooms apply. Pre-op nurse will coordinate an appropriate time for 1 support person to accompany patient in pre-op.  This support person may not rotate.    Please refer to the [facility] website for the visitor guidelines for Inpatients (after your surgery is over and you are in a regular room).       Your procedure is scheduled on: 11/09/2024    Report to [facility] Main Entrance    Report to admitting at   100 pm    Call this number if you have problems the morning of surgery [phone]   Do not eat food :After Midnight.   After Midnight you may have the following liquids until __ 1200 noon ____ DAY OF SURGERY  Water Non-Citrus Juices (without pulp, NO RED-Apple, White grape, White cranberry) Black Coffee (NO MILK/CREAM OR CREAMERS, sugar ok)  Clear Tea (NO MILK/CREAM OR CREAMERS, sugar ok) regular and decaf                             Plain Jell-O (NO RED)                                           Fruit ices (not with fruit pulp, NO RED)                                     Popsicles (NO RED)                                                               Sports drinks like Gatorade (NO RED)                           If you have questions, please contact your surgeon's office.      Oral Hygiene is also important to reduce your risk of infection.                                    Remember - BRUSH YOUR TEETH THE MORNING OF SURGERY WITH YOUR REGULAR TOOTHPASTE  DENTURES WILL BE REMOVED PRIOR TO SURGERY PLEASE DO NOT  APPLY Poly grip OR ADHESIVES!!!   Do NOT smoke after Midnight   Stop all vitamins and herbal supplements 7 days before surgery.   Take these medicines the morning of surgery with A SIP OF WATER: inhalers as usual and bring, eye drops if needed   DO NOT TAKE ANY ORAL DIABETIC MEDICATIONS DAY OF YOUR SURGERY  Bring CPAP mask and tubing day of surgery.                              You may not have any metal on your body including hair pins, jewelry, and body piercing             Do not wear make-up, lotions, powders, perfumes/cologne, or deodorant  Do not wear nail polish including gel and S&S, artificial/acrylic nails, or any other type of covering on natural nails including finger and toenails. If you have artificial nails, gel coating, etc. that needs to be removed by a nail salon please have this removed prior to surgery or surgery may need to be canceled/ delayed if the surgeon/ anesthesia feels like they are unable to be safely monitored.   Do not shave  48 hours prior to surgery.               Men may shave face and neck.   Do not bring valuables to the hospital. [facility] IS NOT             RESPONSIBLE   FOR VALUABLES.   Contacts, glasses, dentures or bridgework may not be worn into surgery.   Bring small overnight bag day of surgery.   DO NOT BRING YOUR HOME MEDICATIONS TO THE HOSPITAL. PHARMACY WILL DISPENSE MEDICATIONS LISTED ON YOUR MEDICATION LIST TO YOU DURING YOUR ADMISSION IN THE HOSPITAL!    Patients discharged on the day of surgery will not be allowed to drive home.  Someone NEEDS to stay with you for the first 24 hours after anesthesia.   Special Instructions: Bring a copy of your healthcare power of attorney and living will documents the day of surgery if you haven't scanned them before.              Please read over the following fact sheets you were given: IF YOU HAVE QUESTIONS ABOUT YOUR PRE-OP INSTRUCTIONS PLEASE CALL [phone].   If you received a COVID  test during your pre-op visit  it is requested that you wear a mask when out in public, stay away from anyone that may not be feeling well and notify your surgeon if you develop symptoms. If you test positive for Covid or have been in contact with anyone that has tested positive in the last 10 days please notify you surgeon.    [facility] - Preparing for Surgery Before surgery, you can play an important role.  Because skin is not sterile, your skin needs to be as free of germs as possible.  You can reduce the number of germs on your skin by washing with CHG (chlorahexidine gluconate) soap before surgery.  CHG is an antiseptic cleaner which kills germs and bonds with the skin to continue killing germs even after washing. Please DO NOT use if you have an allergy to CHG or antibacterial soaps.  If your skin becomes reddened/irritated stop using the CHG and inform your nurse when you arrive at Short Stay. Do not shave (including legs and underarms) for at least 48 hours prior to the first CHG shower.  You may shave your face/neck. Please follow these instructions carefully:  1.  Shower with CHG Soap the night before surgery and the  morning of Surgery.  2.  If you choose to wash your hair, wash your hair first as usual with your  normal  shampoo.  3.  After you shampoo,  rinse your hair and body thoroughly to remove the  shampoo.                           4.  Use CHG as you would any other liquid soap.  You can apply chg directly  to the skin and wash                       Gently with a scrungie or clean washcloth.  5.  Apply the CHG Soap to your body ONLY FROM THE NECK DOWN.   Do not use on face/ open                           Wound or open sores. Avoid contact with eyes, ears mouth and genitals (private parts).                       Wash face,  Genitals (private parts) with your normal soap.             6.  Wash thoroughly, paying special attention to the area where your surgery  will be performed.  7.   Thoroughly rinse your body with warm water from the neck down.  8.  DO NOT shower/wash with your normal soap after using and rinsing off  the CHG Soap.                9.  Pat yourself dry with a clean towel.            10.  Wear clean pajamas.            11.  Place clean sheets on your bed the night of your first shower and do not  sleep with pets. Day of Surgery : Do not apply any lotions/deodorants the morning of surgery.  Please wear clean clothes to the hospital/surgery center.  FAILURE TO FOLLOW THESE INSTRUCTIONS MAY RESULT IN THE CANCELLATION OF YOUR SURGERY PATIENT SIGNATURE_________________________________  NURSE SIGNATURE__________________________________  ________________________________________________________________________

## 2024-11-08 ENCOUNTER — Encounter (HOSPITAL_COMMUNITY)
Admission: RE | Admit: 2024-11-08 | Discharge: 2024-11-08 | Disposition: A | Discharge status: Home / Self Care | Source: Ambulatory Visit | Attending: Urology | Admitting: Urology

## 2024-11-08 ENCOUNTER — Other Ambulatory Visit: Payer: Self-pay

## 2024-11-08 ENCOUNTER — Encounter (HOSPITAL_COMMUNITY): Payer: Self-pay

## 2024-11-08 VITALS — BP 118/71 | HR 94 | Temp 98.6°F | Resp 16 | Ht 66.0 in | Wt 207.9 lb

## 2024-11-08 DIAGNOSIS — Z01818 Encounter for other preprocedural examination: Secondary | ICD-10-CM

## 2024-11-08 DIAGNOSIS — Z01812 Encounter for preprocedural laboratory examination: Secondary | ICD-10-CM | POA: Insufficient documentation

## 2024-11-08 HISTORY — DX: Pneumonia, unspecified organism: J18.9

## 2024-11-08 HISTORY — DX: Personal history of urinary calculi: Z87.442

## 2024-11-08 HISTORY — DX: Headache, unspecified: R51.9

## 2024-11-08 HISTORY — DX: Dyspnea, unspecified: R06.00

## 2024-11-08 LAB — CBC
HCT: 39.6 % (ref 36.0–46.0)
Hemoglobin: 13.2 g/dL (ref 12.0–15.0)
MCH: 29.9 pg (ref 26.0–34.0)
MCHC: 33.3 g/dL (ref 30.0–36.0)
MCV: 89.8 fL (ref 80.0–100.0)
Platelets: 416 K/uL — ABNORMAL HIGH (ref 150–400)
RBC: 4.41 MIL/uL (ref 3.87–5.11)
RDW: 11.9 % (ref 11.5–15.5)
WBC: 10.4 K/uL (ref 4.0–10.5)
nRBC: 0 % (ref 0.0–0.2)

## 2024-11-08 LAB — BASIC METABOLIC PANEL WITH GFR
Anion gap: 11 (ref 5–15)
BUN: 8 mg/dL (ref 6–20)
CO2: 23 mmol/L (ref 22–32)
Calcium: 9.7 mg/dL (ref 8.9–10.3)
Chloride: 105 mmol/L (ref 98–111)
Creatinine, Ser: 0.69 mg/dL (ref 0.44–1.00)
GFR, Estimated: >60 mL/min (ref >60)
Glucose, Bld: 109 mg/dL — ABNORMAL HIGH (ref 70–99)
Potassium: 4.8 mmol/L (ref 3.5–5.1)
Sodium: 139 mmol/L (ref 135–145)

## 2024-11-09 ENCOUNTER — Ambulatory Visit (HOSPITAL_COMMUNITY): Payer: Self-pay | Admitting: Physician Assistant

## 2024-11-09 ENCOUNTER — Ambulatory Visit: Admitting: Urology

## 2024-11-09 ENCOUNTER — Encounter (HOSPITAL_COMMUNITY): Payer: Self-pay | Admitting: Urology

## 2024-11-09 ENCOUNTER — Ambulatory Visit (HOSPITAL_COMMUNITY)

## 2024-11-09 ENCOUNTER — Other Ambulatory Visit: Payer: Self-pay

## 2024-11-09 ENCOUNTER — Encounter (HOSPITAL_COMMUNITY)
Admission: RE | Disposition: A | Discharge status: Home / Self Care | Payer: Self-pay | Source: Home / Self Care | Attending: Urology

## 2024-11-09 ENCOUNTER — Ambulatory Visit (HOSPITAL_BASED_OUTPATIENT_CLINIC_OR_DEPARTMENT_OTHER): Payer: Self-pay | Admitting: Certified Registered"

## 2024-11-09 ENCOUNTER — Ambulatory Visit (HOSPITAL_COMMUNITY)
Admission: RE | Admit: 2024-11-09 | Discharge: 2024-11-09 | Disposition: A | Discharge status: Home / Self Care | Attending: Urology | Admitting: Urology

## 2024-11-09 DIAGNOSIS — N201 Calculus of ureter: Secondary | ICD-10-CM

## 2024-11-09 DIAGNOSIS — I10 Essential (primary) hypertension: Secondary | ICD-10-CM | POA: Diagnosis not present

## 2024-11-09 DIAGNOSIS — J4489 Other specified chronic obstructive pulmonary disease: Secondary | ICD-10-CM | POA: Diagnosis not present

## 2024-11-09 DIAGNOSIS — Z01818 Encounter for other preprocedural examination: Secondary | ICD-10-CM

## 2024-11-09 HISTORY — PX: CYSTOSCOPY/URETEROSCOPY/HOLMIUM LASER/STENT PLACEMENT: SHX6546

## 2024-11-09 LAB — POCT PREGNANCY, URINE: Preg Test, Ur: NEGATIVE

## 2024-11-09 SURGERY — CYSTOSCOPY/URETEROSCOPY/HOLMIUM LASER/STENT PLACEMENT
Anesthesia: General | Laterality: Left

## 2024-11-09 MED ORDER — ACETAMINOPHEN 500 MG PO TABS
1000.0000 mg | ORAL_TABLET | Freq: Once | ORAL | Status: AC
  Administered 2024-11-09: 1000 mg via ORAL
  Filled 2024-11-09: qty 2

## 2024-11-09 MED ORDER — FENTANYL CITRATE (PF) 50 MCG/ML IJ SOSY: 25.0000 ug | PREFILLED_SYRINGE | INTRAMUSCULAR | Status: DC | PRN

## 2024-11-09 MED ORDER — ONDANSETRON HCL 4 MG/2ML IJ SOLN
INTRAMUSCULAR | Status: AC
  Filled 2024-11-09: qty 2

## 2024-11-09 MED ORDER — MIDAZOLAM HCL 2 MG/2ML IJ SOLN
INTRAMUSCULAR | Status: AC
  Filled 2024-11-09: qty 2

## 2024-11-09 MED ORDER — FENTANYL CITRATE (PF) 100 MCG/2ML IJ SOLN
INTRAMUSCULAR | Status: DC | PRN
  Administered 2024-11-09: 100 ug via INTRAVENOUS

## 2024-11-09 MED ORDER — MEPERIDINE HCL 25 MG/ML IJ SOLN: 6.2500 mg | INTRAMUSCULAR | Status: DC | PRN

## 2024-11-09 MED ORDER — PROPOFOL 10 MG/ML IV BOLUS
INTRAVENOUS | Status: AC
  Filled 2024-11-09: qty 20

## 2024-11-09 MED ORDER — SODIUM CHLORIDE 0.9 % IR SOLN
Status: DC | PRN
  Administered 2024-11-09: 3000 mL via INTRAVESICAL

## 2024-11-09 MED ORDER — FENTANYL CITRATE (PF) 100 MCG/2ML IJ SOLN
INTRAMUSCULAR | Status: AC
  Filled 2024-11-09: qty 2

## 2024-11-09 MED ORDER — LACTATED RINGERS IV SOLN: INTRAVENOUS | Status: DC | PRN

## 2024-11-09 MED ORDER — OXYCODONE HCL 5 MG/5ML PO SOLN: 5.0000 mg | Freq: Once | ORAL | Status: DC | PRN

## 2024-11-09 MED ORDER — ORAL CARE MOUTH RINSE: 15.0000 mL | Freq: Once | OROMUCOSAL | Status: AC

## 2024-11-09 MED ORDER — LIDOCAINE HCL (PF) 2 % IJ SOLN
INTRAMUSCULAR | Status: AC
  Filled 2024-11-09: qty 5

## 2024-11-09 MED ORDER — DEXAMETHASONE SOD PHOSPHATE PF 10 MG/ML IJ SOLN
INTRAMUSCULAR | Status: DC | PRN
  Administered 2024-11-09: 5 mg via INTRAVENOUS

## 2024-11-09 MED ORDER — CEFAZOLIN SODIUM-DEXTROSE 2-4 GM/100ML-% IV SOLN
2.0000 g | INTRAVENOUS | Status: AC
  Administered 2024-11-09: 2 g via INTRAVENOUS
  Filled 2024-11-09: qty 100

## 2024-11-09 MED ORDER — CEPHALEXIN 500 MG PO CAPS: 500.0000 mg | ORAL_CAPSULE | Freq: Two times a day (BID) | ORAL | 0 refills | Status: DC

## 2024-11-09 MED ORDER — OXYCODONE HCL 5 MG PO TABS: 5.0000 mg | ORAL_TABLET | Freq: Once | ORAL | Status: DC | PRN

## 2024-11-09 MED ORDER — LIDOCAINE 2% (20 MG/ML) 5 ML SYRINGE
INTRAMUSCULAR | Status: DC | PRN
  Administered 2024-11-09: 80 mg via INTRAVENOUS

## 2024-11-09 MED ORDER — CHLORHEXIDINE GLUCONATE 0.12 % MT SOLN
15.0000 mL | Freq: Once | OROMUCOSAL | Status: AC
  Administered 2024-11-09: 15 mL via OROMUCOSAL

## 2024-11-09 MED ORDER — PROPOFOL 10 MG/ML IV BOLUS
INTRAVENOUS | Status: DC | PRN
  Administered 2024-11-09: 300 mg via INTRAVENOUS

## 2024-11-09 MED ORDER — LACTATED RINGERS IV SOLN: INTRAVENOUS | Status: DC

## 2024-11-09 MED ORDER — ONDANSETRON HCL 4 MG/2ML IJ SOLN
INTRAMUSCULAR | Status: DC | PRN
  Administered 2024-11-09: 4 mg via INTRAVENOUS

## 2024-11-09 MED ORDER — MIDAZOLAM HCL 5 MG/5ML IJ SOLN
INTRAMUSCULAR | Status: DC | PRN
  Administered 2024-11-09: 2 mg via INTRAVENOUS

## 2024-11-09 MED ORDER — MIDAZOLAM HCL (PF) 2 MG/2ML IJ SOLN: 0.5000 mg | Freq: Once | INTRAMUSCULAR | Status: DC | PRN

## 2024-11-09 SURGICAL SUPPLY — 19 items
BAG URO CATCHER STRL LF (MISCELLANEOUS) ×2 IMPLANT
BASKET ZERO TIP NITINOL 2.4FR (BASKET) ×2 IMPLANT
CATH URETL OPEN END 6FR 70 (CATHETERS) IMPLANT
EXTRACTOR STONE NITINOL NGAGE (UROLOGICAL SUPPLIES) IMPLANT
GLOVE SURG LX STRL 8.0 MICRO (GLOVE) ×2 IMPLANT
GOWN STRL REUS W/ TWL XL LVL3 (GOWN DISPOSABLE) ×2 IMPLANT
GUIDEWIRE ANG ZIPWIRE 038X150 (WIRE) IMPLANT
GUIDEWIRE STR DUAL SENSOR (WIRE) ×2 IMPLANT
IV NS 1000ML BAXH (IV SOLUTION) ×2 IMPLANT
KIT TURNOVER KIT A (KITS) ×2 IMPLANT
MANIFOLD NEPTUNE II (INSTRUMENTS) ×2 IMPLANT
PACK CYSTO (CUSTOM PROCEDURE TRAY) ×2 IMPLANT
SHEATH NAVIGATOR HD 11/13X28 (SHEATH) IMPLANT
SHEATH NAVIGATOR HD 11/13X36 (SHEATH) IMPLANT
SHEATH NAVIGATOR HD 12/14X46 (SHEATH) IMPLANT
STENT URET 6FRX24 CONTOUR (STENTS) IMPLANT
TRACTIP FLEXIVA PULS ID 200XHI (Laser) IMPLANT
TUBING CONNECTING 10 (TUBING) ×2 IMPLANT
TUBING UROLOGY SET (TUBING) ×2 IMPLANT

## 2024-11-09 NOTE — Anesthesia Preprocedure Evaluation (Addendum)
 Anesthesia Evaluation  Patient identified by MRN, date of birth, ID band Patient awake    Reviewed: Allergy & Precautions, NPO status , Patient's Chart, lab work & pertinent test results, reviewed documented beta blocker date and time   History of Anesthesia Complications Negative for: history of anesthetic complications  Airway Mallampati: II  TM Distance: >3 FB Neck ROM: Full    Dental  (+) Dental Advisory Given   Pulmonary asthma , COPD,  COPD inhaler   breath sounds clear to auscultation       Cardiovascular hypertension, Pt. on medications and Pt. on home beta blockers (-) angina  Rhythm:Regular Rate:Normal  06/2024 ECHO: EF 60 to 65%.  1. LV EF 62 %. The left ventricle has normal function. The left ventricle has no regional wall motion abnormalities. Left ventricular diastolic  parameters were normal.   2. RVF is normal. The right ventricular size is normal. Tricuspid regurgitation signal is inadequate for assessing PA pressure.   3. The mitral valve is normal in structure. Trivial mitral valve regurgitation. No evidence of mitral stenosis.   4. The aortic valve was not well visualized. Aortic valve regurgitation is not visualized. No aortic stenosis is present.     Neuro/Psych   Anxiety     negative neurological ROS     GI/Hepatic negative GI ROS, Neg liver ROS,,,  Endo/Other  Semaglutide  BMI 33.5  Renal/GU negative Renal ROS     Musculoskeletal   Abdominal   Peds  Hematology negative hematology ROS (+)   Anesthesia Other Findings   Reproductive/Obstetrics                              Anesthesia Physical Anesthesia Plan  ASA: 2  Anesthesia Plan: General   Post-op Pain Management: Tylenol  PO (pre-op)*   Induction: Intravenous  PONV Risk Score and Plan: 3 and Ondansetron , Dexamethasone , Treatment may vary due to age or medical condition and Scopolamine patch - Pre-op  Airway  Management Planned: LMA  Additional Equipment: None  Intra-op Plan:   Post-operative Plan:   Informed Consent: I have reviewed the patients History and Physical, chart, labs and discussed the procedure including the risks, benefits and alternatives for the proposed anesthesia with the patient or authorized representative who has indicated his/her understanding and acceptance.     Dental advisory given  Plan Discussed with: CRNA and Surgeon  Anesthesia Plan Comments:          Anesthesia Quick Evaluation

## 2024-11-09 NOTE — Transfer of Care (Signed)
 Immediate Anesthesia Transfer of Care Note  Patient: Lori Mann  Procedure(s) Performed: CYSTOSCOPY/URETEROSCOPY/HOLMIUM LASER/STENT PLACEMENT (Left)  Patient Location: PACU  Anesthesia Type:General  Level of Consciousness: awake and alert   Airway & Oxygen Therapy: Patient Spontanous Breathing and Patient connected to nasal cannula oxygen  Post-op Assessment: Report given to RN and Post -op Vital signs reviewed and stable  Post vital signs: Reviewed and stable  Last Vitals:  Vitals Value Taken Time  BP 135/85 11/09/24 16:27  Temp    Pulse 90 11/09/24 16:30  Resp 17 11/09/24 16:30  SpO2 94 % 11/09/24 16:30  Vitals shown include unfiled device data.  Last Pain:  Vitals:   11/09/24 1313  TempSrc: Oral         Complications: No notable events documented.

## 2024-11-09 NOTE — Discharge Instructions (Signed)
 You may see some blood in the urine and may have some burning with urination for 48-72 hours. You also may notice that you have to urinate more frequently or urgently after your procedure which is normal.  You should call should you develop an inability urinate, fever > 101, persistent nausea and vomiting that prevents you from eating or drinking to stay hydrated.  If you have a stent, you will likely urinate more frequently and urgently until the stent is removed and you may experience some discomfort/pain in the lower abdomen and flank especially when urinating. You may take pain medication prescribed to you if needed for pain. You may also intermittently have blood in the urine until the stent is removed.  Pull string to remove stent on Monday morning

## 2024-11-09 NOTE — H&P (Signed)
 H&P  Chief Complaint: Lt sided kidne stone  History of Present Illness: Lori Mann is a 33 y.o. year old female here for ureteroscopic mgmt of a Lt ureteral stone--stented d/t infection on 11.10.2025. Has been treated w/ abx.  Past Medical History:  Diagnosis Date   Allergic rhinitis    Allergy 2000   Anxiety 2020   Asthma 2016   Dyspnea    H/O infectious mononucleosis    Headache    History of kidney stones    IBS (irritable bowel syndrome)    Lumbar herniated disc    Narcolepsy without cataplexy(347.00)    Pneumonia     Past Surgical History:  Procedure Laterality Date   CYSTOSCOPY W/ URETERAL STENT PLACEMENT Left 10/31/2024   Procedure: CYSTOSCOPY, WITH RETROGRADE PYELOGRAM AND URETERAL STENT INSERTION;  Surgeon: Lovie Arlyss CROME, MD;  Location: [facility];  Service: Urology;  Laterality: Left;   CYSTOSCOPY WITH RETROGRADE PYELOGRAM, URETEROSCOPY AND STENT PLACEMENT Right 10/11/2015   Procedure: CYSTOSCOPY WITH RETROGRADE PYELOGRAM, URETEROSCOPY,STONE EXTRACTIONAND STENT PLACEMENT;  Surgeon: Garnette Shack, MD;  [facility];  Service: Urology;  Laterality: Right;   EXTRACORPOREAL SHOCK WAVE LITHOTRIPSY Left 10/31/2024   Procedure: LITHOTRIPSY, ESWL;  Surgeon: Shack Garnette, MD;  Location: [facility];  Service: Urology;  Laterality: Left;   EYE SURGERY  2021   HOLMIUM LASER APPLICATION Right 10/11/2015   Procedure: HOLMIUM LASER APPLICATION;  Surgeon: Garnette Shack, MD;  [facility];  Service: Urology;  Laterality: Right;   LUMBAR FUSION     NASAL SEPTUM SURGERY     TONSILLECTOMY      Home Medications:  Medications Prior to Admission  Medication Sig Dispense Refill   acetaminophen  (TYLENOL ) 500 MG tablet Take 500-1,000 mg by mouth every 6 (six) hours as needed (pain.).     albuterol  (VENTOLIN  HFA) 108 (90 Base) MCG/ACT inhaler 2 PUFFS INHALATION EVERY 4-6 HRS AS NEEDED COUGH/WHEEZE/1/2 HOUR PRIOR TO EXERCISE 90 DAYS      azelastine  (OPTIVAR ) 0.05 % ophthalmic solution Place 1 drop into both eyes as needed.     drospirenone-ethinyl estradiol (YAZ,GIANVI,LORYNA) 3-0.02 MG tablet Take 1 tablet by mouth every evening.     EPINEPHrine 0.3 mg/0.3 mL IJ SOAJ injection Inject 0.3 mg into the muscle once as needed (allergic reaction).      FIBER GUMMIES PO Take 2 each by mouth at bedtime.     Hyoscyamine  Sulfate SL (LEVSIN /SL) 0.125 MG SUBL Place 1 tablet (0.125 mg total) under the tongue every 4 (four) hours as needed for up to 5 days (For spasm). 30 tablet 0   levocetirizine (XYZAL) 5 MG tablet Take 5 mg by mouth at bedtime.     metoprolol  succinate (TOPROL  XL) 25 MG 24 hr tablet Take 0.5 tablets (12.5 mg total) by mouth at bedtime. 45 tablet 3   ondansetron  (ZOFRAN ) 4 MG tablet Take 1 tablet (4 mg total) by mouth every 6 (six) hours. (Patient taking differently: Take 4 mg by mouth every 6 (six) hours as needed for vomiting or nausea.) 12 tablet 0   sertraline  (ZOLOFT ) 50 MG tablet TAKE 1 TABLET BY MOUTH EVERY DAY (Patient taking differently: Take 50 mg by mouth at bedtime.) 90 tablet 1   sulfamethoxazole -trimethoprim  (BACTRIM  DS) 800-160 MG tablet Take 1 tablet by mouth 2 (two) times daily. 14 tablet 0   tamsulosin  (FLOMAX ) 0.4 MG CAPS capsule Take 1 capsule (0.4 mg total) by mouth daily after supper. (Patient taking differently: Take 0.4 mg by  mouth in the morning.) 30 capsule 0   Vitamin D -Vitamin K (D3 + K2 PO) Take 1 tablet by mouth every evening.     hyoscyamine (LEVSIN) 0.125 MG tablet Take 1 tablet (0.125 mg total) by mouth every 4 (four) hours as needed. (Patient not taking: Reported on 11/03/2024) 30 tablet 0   methylphenidate  (RITALIN ) 10 MG tablet Take 1 tablet (10 mg total) by mouth 2 (two) times daily. (Patient taking differently: Take 10 mg by mouth 2 (two) times daily as needed (attention/focus).) 60 tablet 0   semaglutide -weight management (WEGOVY ) 2.4 MG/0.75ML SOAJ SQ injection Inject 2.4 mg into the  skin every Sunday.      Allergies:  Allergies  Allergen Reactions   Nickel Dermatitis and Rash    Burning rash     Benzoyl Peroxide Rash and Dermatitis    Family History  Problem Relation Age of Onset   Cancer Mother        breast ca   Arthritis Father    Hyperlipidemia Father    Alcohol abuse Father    Irritable bowel syndrome Sister     Social History:  reports that she has never smoked. She has never used smokeless tobacco. She reports current alcohol use. She reports that she does not use drugs.  ROS: A complete review of systems was performed.  All systems are negative except for pertinent findings as noted.  Physical Exam:  Vital signs in last 24 hours: Temp:  [97.8 F (36.6 C)] 97.8 F (36.6 C) (11/19 1313) Pulse Rate:  [117] 117 (11/19 1313) Resp:  [16] 16 (11/19 1313) BP: (124)/(71) 124/71 (11/19 1313) SpO2:  [97 %] 97 % (11/19 1313) Weight:  [94.3 kg] 94.3 kg (11/19 1319) General:  Alert and oriented, No acute distress HEENT: Normocephalic, atraumatic Neck: No JVD or lymphadenopathy Cardiovascular: Regular rate  Lungs: Normal inspiratory/expiratory excursion Back: No CVA tenderness Extremities: No edema Neurologic: Grossly intact  I have reviewed urinalysis results  I have independently reviewed prior imaging  I have reviewed prior urine culture   Impression/Assessment:  Lt distal ureteral stone--stented  Plan:  Cysto, Lt J2 stent exchange, Lt ureteroscopic stone mgmt  Garnette HERO Shameer Molstad 11/09/2024, 3:10 PM  Garnette HERO. Gaige Fussner MD

## 2024-11-09 NOTE — Anesthesia Procedure Notes (Signed)
 Procedure Name: LMA Insertion Date/Time: 11/09/2024 3:54 PM  Performed by: Janard Culp, CRNAPre-anesthesia Checklist: Patient identified, Emergency Drugs available, Suction available and Patient being monitored Patient Re-evaluated:Patient Re-evaluated prior to induction Oxygen Delivery Method: Circle system utilized Preoxygenation: Pre-oxygenation with 100% oxygen Induction Type: IV induction Ventilation: Mask ventilation without difficulty LMA: LMA inserted LMA Size: 4.0 Tube type: Oral Number of attempts: 1 Airway Equipment and Method: Stylet and Oral airway Placement Confirmation: positive ETCO2 and breath sounds checked- equal and bilateral Tube secured with: Tape Dental Injury: Teeth and Oropharynx as per pre-operative assessment

## 2024-11-09 NOTE — Op Note (Signed)
 Preoperative diagnosis: Left ureteral stone, status post stenting  Postoperative diagnosis: Same  Principal procedure: Cystoscopy, left double-J stent exchange, left ureteroscopic stone extraction, use of fluoroscopy for under 1 hour.  Stent had tether.  Surgeon: Matilda  Anesthesia: General With LMA  Complications none  Specimen: Stone  Estimated blood loss: None  Indications: 33 year old female about 10 days out from urgent stenting of a left ureteral stone.  She was scheduled for lithotripsy, but was found to have tachycardia, infected appearing urine and her stone  was at her UVJ.  She underwent urgent stenting and antibiotic management.  She presents at this time for definitive stone management.  Findings: Bladder appeared normal with the exception of some erythema around the left ureteral orifice where a stent was properly positioned.  Ureteroscopically, there was a solitary left distal ureteral stone approximately 3 cm of the ureter.  No other ureteral abnormalities were noted.  Description of procedure: Patient properly identified and marked in the holding area.  She was taken to the operating room where general anesthetic was administered with the LMA.  She was placed in the dorsolithotomy position.  Genitalia and perineum were prepped, draped, proper timeout performed.  She received proper IV antibiotics.  21 French panendoscope was placed into the bladder.  Bladder systematically inspected with the above-mentioned findings noted.  The stent was grasped and extracted intact.  I then placed a 0.038 inch sensor tip guidewire easily up to the renal pelvis where a curl was seen fluoroscopically.  I then ran a 6 French dual-lumen semirigid ureteroscope into the left ureter where the stone was encountered.  It was grasped with abn engage basket and easily extracted.  It was somewhat tight at the UVJ, so I felt at this point that placing a stent temporarily would be aware of the process.   I then, using a cystoscope with the guidewire backloaded, placed a 6 French by 24 cm contour double-J stent.  It was adequately positioned in the  ureter.  At this point, the guidewire was removed and the stent was deployed with excellent proximal and distal curl seen using fluoroscopic and cystoscopic evidence.  This catheter was left on.  It was tied in a knot outside the urethral meatus, trimmed, and tucked in the vagina.  At this point, the procedure was terminated.  The patient was awakened, taken to the PACU in stable condition, having tolerated the procedure well.

## 2024-11-09 NOTE — Anesthesia Postprocedure Evaluation (Signed)
 Anesthesia Post Note  Patient: Lori Mann  Procedure(s) Performed: CYSTOSCOPY/URETEROSCOPY/HOLMIUM LASER/STENT PLACEMENT (Left)     Patient location during evaluation: PACU Anesthesia Type: General Level of consciousness: awake and alert, oriented and patient cooperative Pain management: pain level controlled Vital Signs Assessment: post-procedure vital signs reviewed and stable Respiratory status: spontaneous breathing, nonlabored ventilation and respiratory function stable Cardiovascular status: blood pressure returned to baseline and stable Postop Assessment: no apparent nausea or vomiting Anesthetic complications: no   No notable events documented.  Last Vitals:  Vitals:   11/09/24 1313 11/09/24 1630  BP: 124/71 116/78  Pulse: (!) 117 90  Resp: 16 17  Temp: 36.6 C 36.9 C  SpO2: 97% 94%    Last Pain:  Vitals:   11/09/24 1630  TempSrc:   PainSc: 0-No pain                 Fatih Stalvey,E. Magnus Crescenzo

## 2024-11-10 ENCOUNTER — Encounter (HOSPITAL_COMMUNITY): Payer: Self-pay | Admitting: Urology

## 2024-11-22 NOTE — Progress Notes (Unsigned)
    History of Present Illness: Here for f/u after ureteroscopic mgmt of a Lt distal ureteral stone w/ basket extraction on 11.19.2025. She was left w/ a tethered stent.  She still has some discomfort in her left lower back with urination.  No gross hematuria, no real dysuria.  Past Medical History:  Diagnosis Date   Allergic rhinitis    Allergy 2000   Anxiety 2020   Asthma 2016   Dyspnea    H/O infectious mononucleosis    Headache    History of kidney stones    IBS (irritable bowel syndrome)    Lumbar herniated disc    Narcolepsy without cataplexy(347.00)    Pneumonia     Past Surgical History:  Procedure Laterality Date   CYSTOSCOPY W/ URETERAL STENT PLACEMENT Left 10/31/2024   Procedure: CYSTOSCOPY, WITH RETROGRADE PYELOGRAM AND URETERAL STENT INSERTION;  Surgeon: Lovie Arlyss CROME, MD;  Location: [facility];  Service: Urology;  Laterality: Left;   CYSTOSCOPY WITH RETROGRADE PYELOGRAM, URETEROSCOPY AND STENT PLACEMENT Right 10/11/2015   Procedure: CYSTOSCOPY WITH RETROGRADE PYELOGRAM, URETEROSCOPY,STONE EXTRACTIONAND STENT PLACEMENT;  Surgeon: Lori Shack, MD;  Location: [facility];  Service: Urology;  Laterality: Right;   CYSTOSCOPY/URETEROSCOPY/HOLMIUM LASER/STENT PLACEMENT Left 11/09/2024   Procedure: CYSTOSCOPY/URETEROSCOPY/HOLMIUM LASER/STENT PLACEMENT;  Surgeon: Mann Garnette, MD;  Location: [facility];  Service: Urology;  Laterality: Left;   EXTRACORPOREAL SHOCK WAVE LITHOTRIPSY Left 10/31/2024   Procedure: LITHOTRIPSY, ESWL;  Surgeon: Mann Garnette, MD;  Location: [facility];  Service: Urology;  Laterality: Left;   EYE SURGERY  2021   HOLMIUM LASER APPLICATION Right 10/11/2015   Procedure: HOLMIUM LASER APPLICATION;  Surgeon: Lori Shack, MD;  Location: [facility];  Service: Urology;  Laterality: Right;   LUMBAR FUSION     NASAL SEPTUM SURGERY     TONSILLECTOMY      Home Medications:  (Not in a hospital  admission)   Allergies:  Allergies  Allergen Reactions   Nickel Dermatitis and Rash    Burning rash     Benzoyl Peroxide Rash and Dermatitis    Family History  Problem Relation Age of Onset   Cancer Mother        breast ca   Arthritis Father    Hyperlipidemia Father    Alcohol abuse Father    Irritable bowel syndrome Sister     Social History:  reports that she has never smoked. She has never used smokeless tobacco. She reports current alcohol use. She reports that she does not use drugs.  ROS: A complete review of systems was performed.  All systems are negative except for pertinent findings as noted.    I have reviewed prior pt notes  I have reviewed urinalysis results--no significant white cells but moderate bacteria.  I have independently reviewed prior imaging--CT stone protocol images from October 2025.  Left upper ureteral stone, tiny left lower pole stone.  I have reviewed prior blood chemistries-calcium levels normal  Impression/Assessment:  Left ureteral stone, status post extraction just over 10 days ago.  Overall doing well with some left back pain.  Urine not necessarily indicative of infection.  A tiny left lower pole stone remaining.  Normal blood calcium levels.  Plan:  1.  Urine culture sent, no antibiotics administered yet  2.  Stone prevention diet discussed  3.  Office visit is  Lori Mann 11/22/2024, 6:38 PM  Lori CHRISTELLA. Lori Sakuma MD

## 2024-11-23 ENCOUNTER — Ambulatory Visit: Admitting: Urology

## 2024-11-23 VITALS — BP 112/60 | HR 101 | Ht 66.0 in | Wt 210.0 lb

## 2024-11-23 DIAGNOSIS — Z87442 Personal history of urinary calculi: Secondary | ICD-10-CM

## 2024-11-23 DIAGNOSIS — N200 Calculus of kidney: Secondary | ICD-10-CM | POA: Diagnosis not present

## 2024-11-23 DIAGNOSIS — N201 Calculus of ureter: Secondary | ICD-10-CM

## 2024-11-23 LAB — URINALYSIS, ROUTINE W REFLEX MICROSCOPIC
Bilirubin, UA: NEGATIVE
Glucose, UA: NEGATIVE
Ketones, UA: NEGATIVE
Nitrite, UA: NEGATIVE
Protein,UA: NEGATIVE
RBC, UA: NEGATIVE
Specific Gravity, UA: 1.02 (ref 1.005–1.030)
Urobilinogen, Ur: 0.2 mg/dL (ref 0.2–1.0)
pH, UA: 5.5 (ref 5.0–7.5)

## 2024-11-23 LAB — MICROSCOPIC EXAMINATION

## 2024-11-25 LAB — URINE CULTURE: Organism ID, Bacteria: NO GROWTH

## 2024-11-27 ENCOUNTER — Ambulatory Visit: Payer: Self-pay | Admitting: Urology

## 2024-12-02 ENCOUNTER — Other Ambulatory Visit: Payer: Self-pay | Admitting: Neurological Surgery

## 2024-12-02 DIAGNOSIS — M461 Sacroiliitis, not elsewhere classified: Secondary | ICD-10-CM

## 2024-12-06 ENCOUNTER — Ambulatory Visit: Admitting: Podiatry

## 2024-12-06 ENCOUNTER — Encounter: Payer: Self-pay | Admitting: Podiatry

## 2024-12-06 ENCOUNTER — Ambulatory Visit (INDEPENDENT_AMBULATORY_CARE_PROVIDER_SITE_OTHER)

## 2024-12-06 DIAGNOSIS — M7661 Achilles tendinitis, right leg: Secondary | ICD-10-CM | POA: Diagnosis not present

## 2024-12-06 DIAGNOSIS — M722 Plantar fascial fibromatosis: Secondary | ICD-10-CM

## 2024-12-06 DIAGNOSIS — M7662 Achilles tendinitis, left leg: Secondary | ICD-10-CM | POA: Diagnosis not present

## 2024-12-06 MED ORDER — MELOXICAM 15 MG PO TABS: 15.0000 mg | ORAL_TABLET | Freq: Every day | ORAL | 0 refills | Status: DC

## 2024-12-06 NOTE — Progress Notes (Signed)
°  Subjective:  Patient ID: Lori Mann, female    DOB: [birth year],   MRN: [id number]  Chief Complaint  Patient presents with   Foot Pain    My feet hurt on the ball of my foot and my heel since I started back to work.  When I get up in the morning, it feels like my Achilles is going to snap in half (bilateral).  I believe I have an ingrown toenail on the big toe on my right foot. (Medial border)    33 y.o. female presents for concern of bilateral foot pain.  Relates most of the pain in the ball in her heel that has started since going back to work and being on her feet.  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  She relates when she is at work walking around the new store is in dress shoes this is when the problem is most significant.  She relates when she stands up she feels like her Achilles is going to snap.  She also has an ingrown toenail she is concerned about.  She relates she is taking Tylenol  to help with the pain and doing some stretching.  She has a history of lumbar fusion and has been doing PT for this and dealing with some SI joint pain.. Denies any other pedal complaints. Denies n/v/f/c.   Past Medical History:  Diagnosis Date   Allergic rhinitis    Allergy 2000   Anxiety 2020   Asthma 2016   Dyspnea    H/O infectious mononucleosis    Headache    History of kidney stones    IBS (irritable bowel syndrome)    Lumbar herniated disc    Narcolepsy without cataplexy(347.00)    Pneumonia     Objective:  Physical Exam: Vascular: DP/PT pulses 2/4 bilateral. CFT <3 seconds. Normal hair growth on digits. No edema.  Skin. No lacerations or abrasions bilateral feet.  Right hallux medial border mildly incurvated.  No erythema or edema or purulence noted. Musculoskeletal: MMT 5/5 bilateral lower extremities in DF, PF, Inversion and Eversion. Deceased ROM in DF of ankle joint.  Minimally tender to medial calcaneal tubercle and insertion of Achilles tendon.  Minimal pain to  the arch bilateral feet.  Decreased ankle dorsiflexion noted. Neurological: Sensation intact to light touch.   Assessment:   1. Plantar fasciitis, bilateral   2. Achilles tendonitis, bilateral      Plan:  Patient was evaluated and treated and all questions answered. Discussed plantar fasciitis and Achilles tendinitis as well as lumbar radiculopathy With patient.  X-rays reviewed and discussed with patient. No acute fractures or dislocations noted. Mild spurring noted at inferior calcaneus bilateral. Discussed treatment options including, ice, NSAIDS, supportive shoes, bracing, and stretching. Stretching exercises provided to be done on a daily basis.   Prescription for meloxicam  provided and sent to pharmacy. Discussed custom molded orthotics and will get fitted today. Follow-up 6 weeks or sooner if any problems arise. In the meantime, encouraged to call the office with any questions, concerns, change in symptoms.     Asberry Failing, DPM

## 2024-12-06 NOTE — Patient Instructions (Signed)
Achilles Tendinitis  with Rehab Achilles tendinitis is a disorder of the Achilles tendon. The Achilles tendon connects the large calf muscles (Gastrocnemius and Soleus) to the heel bone (calcaneus). This tendon is sometimes called the heel cord. It is important for pushing-off and standing on your toes and is important for walking, running, or jumping. Tendinitis is often caused by overuse and repetitive microtrauma. SYMPTOMS  Pain, tenderness, swelling, warmth, and redness may occur over the Achilles tendon even at rest.  Pain with pushing off, or flexing or extending the ankle.  Pain that is worsened after or during activity. CAUSES   Overuse sometimes seen with rapid increase in exercise programs or in sports requiring running and jumping.  Poor physical conditioning (strength and flexibility or endurance).  Running sports, especially training running down hills.  Inadequate warm-up before practice or play or failure to stretch before participation.  Injury to the tendon. PREVENTION   Warm up and stretch before practice or competition.  Allow time for adequate rest and recovery between practices and competition.  Keep up conditioning.  Keep up ankle and leg flexibility.  Improve or keep muscle strength and endurance.  Improve cardiovascular fitness.  Use proper technique.  Use proper equipment (shoes, skates).  To help prevent recurrence, taping, protective strapping, or an adhesive bandage may be recommended for several weeks after healing is complete. PROGNOSIS   Recovery may take weeks to several months to heal.  Longer recovery is expected if symptoms have been prolonged.  Recovery is usually quicker if the inflammation is due to a direct blow as compared with overuse or sudden strain. RELATED COMPLICATIONS   Healing time will be prolonged if the condition is not correctly treated. The injury must be given plenty of time to heal.  Symptoms can reoccur if  activity is resumed too soon.  Untreated, tendinitis may increase the risk of tendon rupture requiring additional time for recovery and possibly surgery. TREATMENT   The first treatment consists of rest anti-inflammatory medication, and ice to relieve the pain.  Stretching and strengthening exercises after resolution of pain will likely help reduce the risk of recurrence. Referral to a physical therapist or athletic trainer for further evaluation and treatment may be helpful.  A walking boot or cast may be recommended to rest the Achilles tendon. This can help break the cycle of inflammation and microtrauma.  Arch supports (orthotics) may be prescribed or recommended by your caregiver as an adjunct to therapy and rest.  Surgery to remove the inflamed tendon lining or degenerated tendon tissue is rarely necessary and has shown less than predictable results. MEDICATION   Nonsteroidal anti-inflammatory medications, such as aspirin and ibuprofen, may be used for pain and inflammation relief. Do not take within 7 days before surgery. Take these as directed by your caregiver. Contact your caregiver immediately if any bleeding, stomach upset, or signs of allergic reaction occur. Other minor pain relievers, such as acetaminophen, may also be used.  Pain relievers may be prescribed as necessary by your caregiver. Do not take prescription pain medication for longer than 4 to 7 days. Use only as directed and only as much as you need.  Cortisone injections are rarely indicated. Cortisone injections may weaken tendons and predispose to rupture. It is better to give the condition more time to heal than to use them. HEAT AND COLD  Cold is used to relieve pain and reduce inflammation for acute and chronic Achilles tendinitis. Cold should be applied for 10 to 15 minutes   every 2 to 3 hours for inflammation and pain and immediately after any activity that aggravates your symptoms. Use ice packs or an ice  massage.  Heat may be used before performing stretching and strengthening activities prescribed by your caregiver. Use a heat pack or a warm soak. SEEK MEDICAL CARE IF:  Symptoms get worse or do not improve in 2 weeks despite treatment.  New, unexplained symptoms develop. Drugs used in treatment may produce side effects.  EXERCISES:  RANGE OF MOTION (ROM) AND STRETCHING EXERCISES - Achilles Tendinitis  These exercises may help you when beginning to rehabilitate your injury. Your symptoms may resolve with or without further involvement from your physician, physical therapist or athletic trainer. While completing these exercises, remember:   Restoring tissue flexibility helps normal motion to return to the joints. This allows healthier, less painful movement and activity.  An effective stretch should be held for at least 30 seconds.  A stretch should never be painful. You should only feel a gentle lengthening or release in the stretched tissue.  STRETCH  Gastroc, Standing   Place hands on wall.  Extend right / left leg, keeping the front knee somewhat bent.  Slightly point your toes inward on your back foot.  Keeping your right / left heel on the floor and your knee straight, shift your weight toward the wall, not allowing your back to arch.  You should feel a gentle stretch in the right / left calf. Hold this position for 10 seconds. Repeat 3 times. Complete this stretch 2 times per day.  STRETCH  Soleus, Standing   Place hands on wall.  Extend right / left leg, keeping the other knee somewhat bent.  Slightly point your toes inward on your back foot.  Keep your right / left heel on the floor, bend your back knee, and slightly shift your weight over the back leg so that you feel a gentle stretch deep in your back calf.  Hold this position for 10 seconds. Repeat 3 times. Complete this stretch 2 times per day.  STRETCH  Gastrocsoleus, Standing  Note: This exercise can place  a lot of stress on your foot and ankle. Please complete this exercise only if specifically instructed by your caregiver.   Place the ball of your right / left foot on a step, keeping your other foot firmly on the same step.  Hold on to the wall or a rail for balance.  Slowly lift your other foot, allowing your body weight to press your heel down over the edge of the step.  You should feel a stretch in your right / left calf.  Hold this position for 10 seconds.  Repeat this exercise with a slight bend in your knee. Repeat 3 times. Complete this stretch 2 times per day.   STRENGTHENING EXERCISES - Achilles Tendinitis These exercises may help you when beginning to rehabilitate your injury. They may resolve your symptoms with or without further involvement from your physician, physical therapist or athletic trainer. While completing these exercises, remember:   Muscles can gain both the endurance and the strength needed for everyday activities through controlled exercises.  Complete these exercises as instructed by your physician, physical therapist or athletic trainer. Progress the resistance and repetitions only as guided.  You may experience muscle soreness or fatigue, but the pain or discomfort you are trying to eliminate should never worsen during these exercises. If this pain does worsen, stop and make certain you are following the directions exactly. If   the pain is still present after adjustments, discontinue the exercise until you can discuss the trouble with your clinician.  STRENGTH - Plantar-flexors   Sit with your right / left leg extended. Holding onto both ends of a rubber exercise band/tubing, loop it around the ball of your foot. Keep a slight tension in the band.  Slowly push your toes away from you, pointing them downward.  Hold this position for 10 seconds. Return slowly, controlling the tension in the band/tubing. Repeat 3 times. Complete this exercise 2 times per day.     STRENGTH - Plantar-flexors   Stand with your feet shoulder width apart. Steady yourself with a wall or table using as little support as needed.  Keeping your weight evenly spread over the width of your feet, rise up on your toes.*  Hold this position for 10 seconds. Repeat 3 times. Complete this exercise 2 times per day.  *If this is too easy, shift your weight toward your right / left leg until you feel challenged. Ultimately, you may be asked to do this exercise with your right / left foot only.  STRENGTH  Plantar-flexors, Eccentric  Note: This exercise can place a lot of stress on your foot and ankle. Please complete this exercise only if specifically instructed by your caregiver.   Place the balls of your feet on a step. With your hands, use only enough support from a wall or rail to keep your balance.  Keep your knees straight and rise up on your toes.  Slowly shift your weight entirely to your right / left toes and pick up your opposite foot. Gently and with controlled movement, lower your weight through your right / left foot so that your heel drops below the level of the step. You will feel a slight stretch in the back of your calf at the end position.  Use the healthy leg to help rise up onto the balls of both feet, then lower weight only on the right / left leg again. Build up to 15 repetitions. Then progress to 3 consecutive sets of 15 repetitions.*  After completing the above exercise, complete the same exercise with a slight knee bend (about 30 degrees). Again, build up to 15 repetitions. Then progress to 3 consecutive sets of 15 repetitions.* Perform this exercise 2 times per day.  *When you easily complete 3 sets of 15, your physician, physical therapist or athletic trainer may advise you to add resistance by wearing a backpack filled with additional weight.  STRENGTH - Plantar Flexors, Seated   Sit on a chair that allows your feet to rest flat on the ground. If  necessary, sit at the edge of the chair.  Keeping your toes firmly on the ground, lift your right / left heel as far as you can without increasing any discomfort in your ankle. Repeat 3 times. Complete this exercise 2 times a day.    Plantar Fasciitis (Heel Spur Syndrome) with Rehab The plantar fascia is a fibrous, ligament-like, soft-tissue structure that spans the bottom of the foot. Plantar fasciitis is a condition that causes pain in the foot due to inflammation of the tissue. SYMPTOMS   Pain and tenderness on the underneath side of the foot.  Pain that worsens with standing or walking. CAUSES  Plantar fasciitis is caused by irritation and injury to the plantar fascia on the underneath side of the foot. Common mechanisms of injury include:  Direct trauma to bottom of the foot.  Damage to a small   nerve that runs under the foot where the main fascia attaches to the heel bone.  Stress placed on the plantar fascia due to bone spurs. RISK INCREASES WITH:   Activities that place stress on the plantar fascia (running, jumping, pivoting, or cutting).  Poor strength and flexibility.  Improperly fitted shoes.  Tight calf muscles.  Flat feet.  Failure to warm-up properly before activity.  Obesity. PREVENTION  Warm up and stretch properly before activity.  Allow for adequate recovery between workouts.  Maintain physical fitness:  Strength, flexibility, and endurance.  Cardiovascular fitness.  Maintain a health body weight.  Avoid stress on the plantar fascia.  Wear properly fitted shoes, including arch supports for individuals who have flat feet.  PROGNOSIS  If treated properly, then the symptoms of plantar fasciitis usually resolve without surgery. However, occasionally surgery is necessary.  RELATED COMPLICATIONS   Recurrent symptoms that may result in a chronic condition.  Problems of the lower back that are caused by compensating for the injury, such as  limping.  Pain or weakness of the foot during push-off following surgery.  Chronic inflammation, scarring, and partial or complete fascia tear, occurring more often from repeated injections.  TREATMENT  Treatment initially involves the use of ice and medication to help reduce pain and inflammation. The use of strengthening and stretching exercises may help reduce pain with activity, especially stretches of the Achilles tendon. These exercises may be performed at home or with a therapist. Your caregiver may recommend that you use heel cups of arch supports to help reduce stress on the plantar fascia. Occasionally, corticosteroid injections are given to reduce inflammation. If symptoms persist for greater than 6 months despite non-surgical (conservative), then surgery may be recommended.   MEDICATION   If pain medication is necessary, then nonsteroidal anti-inflammatory medications, such as aspirin and ibuprofen, or other minor pain relievers, such as acetaminophen, are often recommended.  Do not take pain medication within 7 days before surgery.  Prescription pain relievers may be given if deemed necessary by your caregiver. Use only as directed and only as much as you need.  Corticosteroid injections may be given by your caregiver. These injections should be reserved for the most serious cases, because they may only be given a certain number of times.  HEAT AND COLD  Cold treatment (icing) relieves pain and reduces inflammation. Cold treatment should be applied for 10 to 15 minutes every 2 to 3 hours for inflammation and pain and immediately after any activity that aggravates your symptoms. Use ice packs or massage the area with a piece of ice (ice massage).  Heat treatment may be used prior to performing the stretching and strengthening activities prescribed by your caregiver, physical therapist, or athletic trainer. Use a heat pack or soak the injury in warm water.  SEEK IMMEDIATE MEDICAL  CARE IF:  Treatment seems to offer no benefit, or the condition worsens.  Any medications produce adverse side effects.  EXERCISES- RANGE OF MOTION (ROM) AND STRETCHING EXERCISES - Plantar Fasciitis (Heel Spur Syndrome) These exercises may help you when beginning to rehabilitate your injury. Your symptoms may resolve with or without further involvement from your physician, physical therapist or athletic trainer. While completing these exercises, remember:   Restoring tissue flexibility helps normal motion to return to the joints. This allows healthier, less painful movement and activity.  An effective stretch should be held for at least 30 seconds.  A stretch should never be painful. You should only feel a gentle lengthening   or release in the stretched tissue.  RANGE OF MOTION - Toe Extension, Flexion  Sit with your right / left leg crossed over your opposite knee.  Grasp your toes and gently pull them back toward the top of your foot. You should feel a stretch on the bottom of your toes and/or foot.  Hold this stretch for 10 seconds.  Now, gently pull your toes toward the bottom of your foot. You should feel a stretch on the top of your toes and or foot.  Hold this stretch for 10 seconds. Repeat  times. Complete this stretch 3 times per day.   RANGE OF MOTION - Ankle Dorsiflexion, Active Assisted  Remove shoes and sit on a chair that is preferably not on a carpeted surface.  Place right / left foot under knee. Extend your opposite leg for support.  Keeping your heel down, slide your right / left foot back toward the chair until you feel a stretch at your ankle or calf. If you do not feel a stretch, slide your bottom forward to the edge of the chair, while still keeping your heel down.  Hold this stretch for 10 seconds. Repeat 3 times. Complete this stretch 2 times per day.   STRETCH  Gastroc, Standing  Place hands on wall.  Extend right / left leg, keeping the front knee  somewhat bent.  Slightly point your toes inward on your back foot.  Keeping your right / left heel on the floor and your knee straight, shift your weight toward the wall, not allowing your back to arch.  You should feel a gentle stretch in the right / left calf. Hold this position for 10 seconds. Repeat 3 times. Complete this stretch 2 times per day.  STRETCH  Soleus, Standing  Place hands on wall.  Extend right / left leg, keeping the other knee somewhat bent.  Slightly point your toes inward on your back foot.  Keep your right / left heel on the floor, bend your back knee, and slightly shift your weight over the back leg so that you feel a gentle stretch deep in your back calf.  Hold this position for 10 seconds. Repeat 3 times. Complete this stretch 2 times per day.  STRETCH  Gastrocsoleus, Standing  Note: This exercise can place a lot of stress on your foot and ankle. Please complete this exercise only if specifically instructed by your caregiver.   Place the ball of your right / left foot on a step, keeping your other foot firmly on the same step.  Hold on to the wall or a rail for balance.  Slowly lift your other foot, allowing your body weight to press your heel down over the edge of the step.  You should feel a stretch in your right / left calf.  Hold this position for 10 seconds.  Repeat this exercise with a slight bend in your right / left knee. Repeat 3 times. Complete this stretch 2 times per day.   STRENGTHENING EXERCISES - Plantar Fasciitis (Heel Spur Syndrome)  These exercises may help you when beginning to rehabilitate your injury. They may resolve your symptoms with or without further involvement from your physician, physical therapist or athletic trainer. While completing these exercises, remember:   Muscles can gain both the endurance and the strength needed for everyday activities through controlled exercises.  Complete these exercises as instructed by  your physician, physical therapist or athletic trainer. Progress the resistance and repetitions only as guided.  STRENGTH -   Towel Curls  Sit in a chair positioned on a non-carpeted surface.  Place your foot on a towel, keeping your heel on the floor.  Pull the towel toward your heel by only curling your toes. Keep your heel on the floor. Repeat 3 times. Complete this exercise 2 times per day.  STRENGTH - Ankle Inversion  Secure one end of a rubber exercise band/tubing to a fixed object (table, pole). Loop the other end around your foot just before your toes.  Place your fists between your knees. This will focus your strengthening at your ankle.  Slowly, pull your big toe up and in, making sure the band/tubing is positioned to resist the entire motion.  Hold this position for 10 seconds.  Have your muscles resist the band/tubing as it slowly pulls your foot back to the starting position. Repeat 3 times. Complete this exercises 2 times per day.  Document Released: 12/08/2005 Document Revised: 03/01/2012 Document Reviewed: 03/22/2009 [employer] Patient Information 2014 [employer].  

## 2024-12-06 NOTE — Progress Notes (Signed)
 Visit:  ORTHOTIC SCAN/ EVALUATION  Patient presented for evaluation/ scan for custom molded foot orthotics.  Patient will benefit from custom foot orthotics to provide total contact to bilateral medial longitudinal arches to help balance and distribute body weight more evenly.  Thus reducing plantar pressure and pain.   Orthotic will encourage forefoot and rearfoot alignment.    Patient was scanned today with [facility] scanner.    Orthotics are ordered.  Signature obtained for notification of pricing/ fees for the device.  When the orthotic is ready for pick up, will call to make an appointment for a fitting.    Najat Olazabal, DPM

## 2024-12-14 ENCOUNTER — Ambulatory Visit: Admitting: Family Medicine

## 2024-12-14 ENCOUNTER — Encounter: Payer: Self-pay | Admitting: Family Medicine

## 2024-12-14 VITALS — BP 126/82 | HR 93 | Ht 66.0 in | Wt 210.0 lb

## 2024-12-14 DIAGNOSIS — M545 Low back pain, unspecified: Secondary | ICD-10-CM | POA: Diagnosis not present

## 2024-12-14 DIAGNOSIS — Q7962 Hypermobile Ehlers-Danlos syndrome: Secondary | ICD-10-CM | POA: Diagnosis not present

## 2024-12-14 DIAGNOSIS — L230 Allergic contact dermatitis due to metals: Secondary | ICD-10-CM | POA: Insufficient documentation

## 2024-12-14 DIAGNOSIS — J309 Allergic rhinitis, unspecified: Secondary | ICD-10-CM | POA: Insufficient documentation

## 2024-12-14 DIAGNOSIS — J3081 Allergic rhinitis due to animal (cat) (dog) hair and dander: Secondary | ICD-10-CM | POA: Insufficient documentation

## 2024-12-14 DIAGNOSIS — J301 Allergic rhinitis due to pollen: Secondary | ICD-10-CM | POA: Insufficient documentation

## 2024-12-14 DIAGNOSIS — M357 Hypermobility syndrome: Secondary | ICD-10-CM | POA: Diagnosis not present

## 2024-12-14 DIAGNOSIS — J45990 Exercise induced bronchospasm: Secondary | ICD-10-CM | POA: Insufficient documentation

## 2024-12-14 DIAGNOSIS — H1045 Other chronic allergic conjunctivitis: Secondary | ICD-10-CM | POA: Insufficient documentation

## 2024-12-14 DIAGNOSIS — I951 Orthostatic hypotension: Secondary | ICD-10-CM | POA: Insufficient documentation

## 2024-12-14 DIAGNOSIS — G8929 Other chronic pain: Secondary | ICD-10-CM

## 2024-12-14 MED ORDER — METOPROLOL SUCCINATE ER 25 MG PO TB24: 25.0000 mg | ORAL_TABLET | Freq: Every day | ORAL | 3 refills | Status: AC

## 2024-12-14 MED ORDER — TIZANIDINE HCL 2 MG PO TABS: 2.0000 mg | ORAL_TABLET | Freq: Three times a day (TID) | ORAL | 1 refills | Status: AC | PRN

## 2024-12-14 NOTE — Patient Instructions (Addendum)
 Thank you for coming in today.   RX's sent in for Metoprolol  and Tizanidine .   A referral for physical therapy has been submitted. A representative from the physical therapy office will contact you to coordinate scheduling after confirming your benefits with your insurance provider. If you do not hear from the physical therapy office within the next 1-2 weeks, please let us  know.  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    For POTS symptoms: Consume 3 L water and 8-12 gram of salt per day   Check out these websites for exercises to try if you have POTS ([employer] Protocol, [place] Protocol, and [employer] Protocol) [email or web address] [email or web address].[email or web address]   Guide to Prof. 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

## 2024-12-14 NOTE — Progress Notes (Signed)
 "        I, Leotis Batter, CMA acting as a scribe for Artist Lloyd, MD.  Lori Mann is a 33 y.o. female who presents to [facility] at [facility] today for evaluation of hypermobility.   She notes chronic low back pain.  She has had lumbar surgery and SI injections at [facility].  Has been a while since she has tried any physical therapy.  Previously did physical therapy at [facility].  MS: Chronic joint pain, Chronic wide spread muscle pain, Joint Hypermobility, Joint Instability, Joint Subluxation, Abnormal spinal curvature, and Slipping Ribs / Costochondritis  Skin/Immune reactions: Fragile Skin that tears easily, Poor Wound Healing, Easy Bruising / Bleeding, Atrophic Scars, and Abnormal Stretch Marks Before Puberty / without significant weight gain  Neurological: Headache  Migraine, Syncope / Pre-Syncope, Reduced Proprioception, Clumsiness , Burning, Numbness and/or Tingling in Extremities, Cognitive Dysfunction / Brain Fog, Sleep Disturbance, and Sensory Sensitivities ANS: Syncope / Pre-Syncope / Dizziness, Fatigue, Exercise / Temperature Intolerance , and Anxiety / Panic Respiratory: Dyspnea and Chest Tightness CV: Tachycardia GI:  GERD, IBS with Diarrhea or Constipation, and Painful ABD Bloating Genitourinary: Pelvic Pain / Fullness / Pressure, Incontinence, Recurrent UTI / Cystitis, and Painful Intercourse Hands & Feet: Piezogenic Papules   Pertinent review of systems: No fevers or chills  Relevant historical information: Lumbar surgery   Exam:  BP 126/82   Pulse 93   Ht 5' 6 (1.676 m)   Wt 210 lb (95.3 kg)   LMP 11/13/2024 (Approximate)   SpO2 98%   BMI 33.89 kg/m  General: Well Developed, well nourished, and in no acute distress.   MSK: L-spine decreased lumbar motion.  Hypermobility evaluation positive Beighton score 6/9. Ehlers-Danlos evaluation is equivocal with a score of 4-5    Lab and Radiology Results  CT scan lumbar spine and  right hip visible on CT scan abdomen and pelvis from October 2025 personally and independently interpreted today.  Well appearing lumbar fusion.  No significant abnormality of the SI joint.  No hip arthritis.     Assessment and Plan: 33 y.o. female with low back pain and right anterior hip pain and bilateral knee pain occurring in the setting of hypermobility.  Patient almost meets criteria for hypermobile EDS today.  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  I think it is quite likely that with a bit of time she is can accumulate enough abnormalities that she will have hypermobile EDS and we will treat her as thus.  Will refer to physical therapy and prescribe tizanidine  for use at bedtime.  Additionally patient has had near syncope and tachycardia and has had a pretty good cardiac workup already.  She was prescribed 12.5 mg of extended release metoprolol  daily which was not very effective.  Will treat consistent with POTS or dysautonomia with increasing dose of metoprolol  to 25 mg daily.  Recommend salt and fluid goals and exercise goals.  Handout and reassurance material provided.  Recheck in 1 month.   PDMP not reviewed this encounter. Orders Placed This Encounter  Procedures   Ambulatory referral to Physical Therapy    Referral Priority:   Routine    Referral Type:   Physical Medicine    Referral Reason:   Specialty Services Required    Requested Specialty:   Physical Therapy    Number of Visits Requested:   1   Meds ordered this encounter  Medications   tiZANidine  (ZANAFLEX )  2 MG tablet    Sig: Take 1-2 tablets (2-4 mg total) by mouth every 8 (eight) hours as needed.    Dispense:  60 tablet    Refill:  1   metoprolol  succinate (TOPROL  XL) 25 MG 24 hr tablet    Sig: Take 1 tablet (25 mg total) by mouth at bedtime.    Dispense:  90 tablet    Refill:  3     Discussed warning signs or symptoms. Please see discharge  instructions. Patient expresses understanding.   The above documentation has been reviewed and is accurate and complete Artist Lloyd, M.D.   "

## 2024-12-26 ENCOUNTER — Inpatient Hospital Stay: Admission: RE | Admit: 2024-12-26 | Source: Ambulatory Visit

## 2024-12-26 ENCOUNTER — Other Ambulatory Visit

## 2025-01-02 ENCOUNTER — Other Ambulatory Visit

## 2025-01-06 ENCOUNTER — Telehealth: Payer: Self-pay | Admitting: Podiatry

## 2025-01-06 NOTE — Telephone Encounter (Signed)
 Orthotics are in [place] office. Spoke to [employer] and they are schedule to 01/17/2025.

## 2025-01-09 ENCOUNTER — Encounter (HOSPITAL_BASED_OUTPATIENT_CLINIC_OR_DEPARTMENT_OTHER): Payer: Self-pay | Admitting: Pulmonary Disease

## 2025-01-09 ENCOUNTER — Ambulatory Visit (HOSPITAL_BASED_OUTPATIENT_CLINIC_OR_DEPARTMENT_OTHER): Admitting: Pulmonary Disease

## 2025-01-09 VITALS — BP 124/70 | HR 108 | Ht 66.0 in | Wt 209.4 lb

## 2025-01-09 DIAGNOSIS — G4733 Obstructive sleep apnea (adult) (pediatric): Secondary | ICD-10-CM | POA: Diagnosis not present

## 2025-01-09 DIAGNOSIS — G4721 Circadian rhythm sleep disorder, delayed sleep phase type: Secondary | ICD-10-CM | POA: Diagnosis not present

## 2025-01-09 DIAGNOSIS — J45909 Unspecified asthma, uncomplicated: Secondary | ICD-10-CM | POA: Diagnosis not present

## 2025-01-09 MED ORDER — ZALEPLON 5 MG PO CAPS: 5.0000 mg | ORAL_CAPSULE | Freq: Every evening | ORAL | 2 refills | Status: AC | PRN

## 2025-01-09 NOTE — Progress Notes (Signed)
 "  Subjective:    Patient ID: Lori Mann, female    DOB: 06/13/1991, 34 y.o.   MRN: [id number]   For FU of OSA To establish care from me , switching from dr neda  Discussed the use of AI scribe software for clinical note transcription with the patient, who gave verbal consent to proceed.  History of Present Illness Lori Mann is a 34 year old female with a history of sleep disorder breathing who presents for a second opinion to establish care.  She was initially diagnosed with sleep apnea by a home sleep study and started CPAP without benefit. A later in-lab sleep study reportedly did not confirm sleep apnea. She was then treated with Ritalin  for excessive daytime sleepiness with episodes of falling asleep at work and while driving, which improved on treatment. She is no longer on Ritalin  and no longer has excessive daytime sleepiness.  Over the past year she has shifted from daytime sleepiness to difficulty initiating sleep. She goes to bed around 11:00 PM but often does not fall asleep until 2:30-3:00 AM, then wakes exhausted and has trouble getting to work on time. Multiple OTC sleep aids including Tylenol  PM, ibuprofen PM, and melatonin have not helped. On weekends, when able to sleep in until early afternoon, she feels more rested.  She sleeps on her back with one pillow under her head and one under her knees due to prior back surgery, and she is unable to sleep on her side. She finds CPAP uncomfortable and restless, has not perceived benefit from it, and has not used it consistently.  She has asthma previously worsened by cat exposure. She keeps cats out of the bedroom, which has reduced nocturnal wheeze and her need for albuterol  and Breo. She has not required these medications recently.    Did not tolerate Provigil in the past, was unable to sleep for many days  Tried different medications but Ritalin  was the only 1 she was able to tolerate and worked well  Significant  tests/ events reviewed  05/2023 HST >> AHI 32/h, predom hypopneas+, low sat 84% (216 lbs )  05/28/2016 NPSG -Negative for significant sleep disordered breathing - wt 210 lbs 2017 MSLT  2:25 mins, sOREM x 1  ,consistent with idiopathic hypersomnolence  HST 08/2015 AHI 12/h   PFT 04/2023 >> ratio 73, FEV1 73%, 14 % BD response Review of Systems  neg for any significant sore throat, dysphagia, itching, sneezing, nasal congestion or excess/ purulent secretions, fever, chills, sweats, unintended wt loss, pleuritic or exertional cp, hempoptysis, orthopnea pnd or change in chronic leg swelling. Also denies presyncope, palpitations, heartburn, abdominal pain, nausea, vomiting, diarrhea or change in bowel or urinary habits, dysuria,hematuria, rash, arthralgias, visual complaints, headache, numbness weakness or ataxia.      Objective:   Physical Exam  Gen. Pleasant, obese, in no distress ENT - no lesions, no post nasal drip Neck: No JVD, no thyromegaly, no carotid bruits Lungs: no use of accessory muscles, no dullness to percussion, decreased without rales or rhonchi  Cardiovascular: Rhythm regular, heart sounds  normal, no murmurs or gallops, no peripheral edema Musculoskeletal: No deformities, no cyanosis or clubbing , no tremors       Assessment & Plan:   Assessment and Plan Assessment & Plan Obstructive sleep apnea Confirmed by two home studies and a recent lab study showing severe apnea with 32 events per hour and oxygen desaturation to 84%. Previous CPAP use was ineffective and uncomfortable. Current CPAP machine  is old and may require recalibration. Nasal mask suggested as an alternative to current pillow mask due to nasal passage issues. - Advised use of CPAP machine with nasal N30 mask. - Will download CPAP data in one month to assess pressure settings. - Will consider nasal mask if current pillow mask is uncomfortable. - Will follow up in three months to reassess CPAP  efficacy.  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 Idiopathic hypersomnolence Characterized by difficulty falling asleep until 2-3 AM and waking up late, with a natural tendency to sleep late. No significant lifestyle changes or shift work history. Previous use of Ritalin  for idiopathic hypersomnolence, but currently not needed. Plan to shift sleep schedule to align with work hours. - Prescribed Sonata  for sleep aid, to be taken no earlier than 10:30 PM if bedtime is 11:30 PM. - Implement gradual sleep schedule shift: start with 12:30 AM to 8:30 AM, then shift to 12:00 AM to 8:00 AM, and eventually to 11:30 AM to 7:30 AM. - Encourage morning light exposure for at least 30 minutes, preferably by a window at work. - Advised against screen use at least 30 minutes before bedtime. - Allow weekend catch-up sleep without Sonata .  Asthma Well-controlled with no recent need for albuterol  or Breo. Previous exacerbations related to cat allergies, now managed by keeping cats out of the bedroom. - Continue current asthma management without additional medications.     "

## 2025-01-09 NOTE — Addendum Note (Signed)
 Addended by: TRUDY WARREN CROME on: 01/09/2025 04:38 PM   Modules accepted: Orders

## 2025-01-09 NOTE — Patient Instructions (Addendum)
 X Get back on CPAP   X Rx for N30 nasal mask   X SUnlight exposure in daytime  X Sonata  at bedtime   VISIT SUMMARY: During your visit, we discussed your sleep disorder breathing, difficulty initiating sleep, and asthma management. We reviewed your history, current symptoms, and previous treatments to develop a comprehensive plan to address your concerns.  YOUR PLAN: -OBSTRUCTIVE SLEEP APNEA: Obstructive sleep apnea is a condition where your breathing repeatedly stops and starts during sleep. It was confirmed by recent studies showing severe apnea. We recommend using your CPAP machine with a nasal mask, as it may be more comfortable. We will download your CPAP data in one month to check the pressure settings and follow up in three months to reassess its effectiveness.  -DELAYED SLEEP PHASE DISORDER: Delayed sleep phase disorder is a condition where your sleep is delayed by two or more hours beyond the conventional bedtime, causing difficulty in waking up at a desired time. We have prescribed Sonata  to help you fall asleep earlier and suggested a gradual shift in your sleep schedule. Additionally, we recommend morning light exposure and avoiding screens before bedtime.  -ASTHMA: Asthma is a condition in which your airways narrow and swell, producing extra mucus. Your asthma is well-controlled, and you have not needed albuterol  or Breo recently. Continue your current management by keeping cats out of the bedroom.  INSTRUCTIONS: Please use your CPAP machine with the nasal mask and follow the sleep schedule adjustments as discussed. We will download your CPAP data in one month and follow up in three months to reassess its effectiveness. Continue your current asthma management and keep cats out of the bedroom.    Contains text generated by [employer].

## 2025-01-16 ENCOUNTER — Ambulatory Visit: Admitting: Family Medicine

## 2025-01-17 ENCOUNTER — Ambulatory Visit: Admitting: Podiatry

## 2025-01-17 ENCOUNTER — Encounter: Payer: Self-pay | Admitting: Podiatry

## 2025-01-17 DIAGNOSIS — M7662 Achilles tendinitis, left leg: Secondary | ICD-10-CM

## 2025-01-17 DIAGNOSIS — M722 Plantar fascial fibromatosis: Secondary | ICD-10-CM

## 2025-01-17 DIAGNOSIS — M7661 Achilles tendinitis, right leg: Secondary | ICD-10-CM

## 2025-01-17 MED ORDER — MELOXICAM 15 MG PO TABS: 15.0000 mg | ORAL_TABLET | Freq: Every day | ORAL | 0 refills | Status: AC

## 2025-01-17 NOTE — Progress Notes (Signed)
"  °  Subjective:  Patient ID: Lori Mann, female    DOB: 09/29/1991,   MRN: [id number]  Chief Complaint  Patient presents with   Plantar Fasciitis    They're good.  I'm here to pick up the orthotics.    34 y.o. female presents for follow-up of bilateral plantar fasciitis and to pick up orthotics.  She relates overall she is doing much better and the meloxicam  has been helping.  She is hoping orthotics will help when on her feet working.. Denies any other pedal complaints. Denies n/v/f/c.   Past Medical History:  Diagnosis Date   Allergic rhinitis    Allergy 2000   Anxiety 2020   Asthma 2016   Dyspnea    H/O infectious mononucleosis    Headache    History of kidney stones    IBS (irritable bowel syndrome)    Lumbar herniated disc    Narcolepsy without cataplexy(347.00)    Pneumonia     Objective:  Physical Exam: Vascular: DP/PT pulses 2/4 bilateral. CFT <3 seconds. Normal hair growth on digits. No edema.  Skin. No lacerations or abrasions bilateral feet.  Right hallux medial border mildly incurvated.  No erythema or edema or purulence noted. Musculoskeletal: MMT 5/5 bilateral lower extremities in DF, PF, Inversion and Eversion. Deceased ROM in DF of ankle joint.  Minimally tender to medial calcaneal tubercle and insertion of Achilles tendon.  Minimal pain to the arch bilateral feet.  Decreased ankle dorsiflexion noted. Neurological: Sensation intact to light touch.   Assessment:   1. Plantar fasciitis, bilateral   2. Achilles tendonitis, bilateral       Plan:  Patient was evaluated and treated and all questions answered. Discussed plantar fasciitis and Achilles tendinitis as well as lumbar radiculopathy With patient.  X-rays reviewed and discussed with patient. No acute fractures or dislocations noted. Mild spurring noted at inferior calcaneus bilateral. Discussed treatment options including, ice, NSAIDS, supportive shoes, bracing, and stretching. Continue stretching  and support. Meloxicam  sent as a refill to pharmacy Patient presents today for orthotic pick up. Patient voices no new complaints. Orthotics were fitted to patient's feet. No discomfort and no rubbing. Patient satisfied with the orthotics. Orthotics were dispensed to patient with instructions for break in wear and to call the office if any concerns or questions. Follow-up as needed   Asberry Failing, DPM    "

## 2025-01-21 ENCOUNTER — Encounter: Payer: Self-pay | Admitting: Family Medicine

## 2025-01-26 NOTE — Progress Notes (Unsigned)
"       ° °  LILLETTE Ileana Collet, PhD, LAT, ATC acting as a scribe for Artist Lloyd, MD.  Lori Mann is a 34 y.o. female who presents to [facility] at [facility] today for low back, R hip, and bilat knee pain in the setting of hypermobility. Pt was last seen by Dr. Lloyd on 12/14/24 and was advised on fluid/salt intake, exercise goals, and referred to [facility]. Also prescribed tizanidine  and her metoprolol  was increased to 25mg .  Today, pt reports ***  Pertinent review of systems: ***  Relevant historical information: ***   Exam:  LMP 01/11/2025 (Approximate)  General: Well Developed, well nourished, and in no acute distress.   MSK: ***    Lab and Radiology Results No results found for this or any previous visit (from the past 72 hours). No results found.     Assessment and Plan: 34 y.o. female with ***   PDMP not reviewed this encounter. No orders of the defined types were placed in this encounter.  No orders of the defined types were placed in this encounter.    Discussed warning signs or symptoms. Please see discharge instructions. Patient expresses understanding.   ***  "

## 2025-01-30 ENCOUNTER — Encounter (HOSPITAL_BASED_OUTPATIENT_CLINIC_OR_DEPARTMENT_OTHER): Admitting: Pulmonary Disease

## 2025-01-30 ENCOUNTER — Ambulatory Visit: Admitting: Family Medicine

## 2025-04-17 ENCOUNTER — Ambulatory Visit (HOSPITAL_BASED_OUTPATIENT_CLINIC_OR_DEPARTMENT_OTHER): Admitting: Pulmonary Disease
